# Patient Record
Sex: Female | Born: 1985 | Race: Black or African American | Hispanic: No | Marital: Married | Smoking: Never smoker
Health system: Southern US, Community
[De-identification: ages and names within clinical notes are randomized; demographics above are authoritative.]

## PROBLEM LIST (undated history)

## (undated) ENCOUNTER — Inpatient Hospital Stay (HOSPITAL_COMMUNITY): Payer: Self-pay

## (undated) DIAGNOSIS — D509 Iron deficiency anemia, unspecified: Secondary | ICD-10-CM

## (undated) DIAGNOSIS — M549 Dorsalgia, unspecified: Secondary | ICD-10-CM

## (undated) DIAGNOSIS — R350 Frequency of micturition: Secondary | ICD-10-CM

## (undated) DIAGNOSIS — E119 Type 2 diabetes mellitus without complications: Secondary | ICD-10-CM

## (undated) DIAGNOSIS — Z8669 Personal history of other diseases of the nervous system and sense organs: Secondary | ICD-10-CM

## (undated) DIAGNOSIS — O139 Gestational [pregnancy-induced] hypertension without significant proteinuria, unspecified trimester: Secondary | ICD-10-CM

## (undated) DIAGNOSIS — C801 Malignant (primary) neoplasm, unspecified: Secondary | ICD-10-CM

## (undated) HISTORY — DX: Type 2 diabetes mellitus without complications: E11.9

## (undated) HISTORY — PX: COLONOSCOPY: SHX174

## (undated) HISTORY — DX: Iron deficiency anemia, unspecified: D50.9

## (undated) HISTORY — DX: Malignant (primary) neoplasm, unspecified: C80.1

---

## 2015-01-14 LAB — OB RESULTS CONSOLE ABO/RH: RH TYPE: POSITIVE

## 2015-01-14 LAB — OB RESULTS CONSOLE HGB/HCT, BLOOD
HEMATOCRIT: 35 %
HEMOGLOBIN: 11.2 g/dL

## 2015-01-14 LAB — OB RESULTS CONSOLE HIV ANTIBODY (ROUTINE TESTING): HIV: NONREACTIVE

## 2015-01-14 LAB — OB RESULTS CONSOLE PLATELET COUNT: PLATELETS: 489 10*3/uL

## 2015-01-14 LAB — OB RESULTS CONSOLE ANTIBODY SCREEN: Antibody Screen: NEGATIVE

## 2015-01-14 LAB — OB RESULTS CONSOLE RUBELLA ANTIBODY, IGM: Rubella: IMMUNE

## 2015-01-14 LAB — OB RESULTS CONSOLE RPR: RPR: NONREACTIVE

## 2015-01-14 LAB — OB RESULTS CONSOLE HEPATITIS B SURFACE ANTIGEN: HEP B S AG: NEGATIVE

## 2015-01-14 LAB — OB RESULTS CONSOLE VARICELLA ZOSTER ANTIBODY, IGG: Varicella: IMMUNE

## 2015-02-03 LAB — OB RESULTS CONSOLE GC/CHLAMYDIA
CHLAMYDIA, DNA PROBE: NEGATIVE
GC PROBE AMP, GENITAL: NEGATIVE

## 2015-03-09 ENCOUNTER — Inpatient Hospital Stay (HOSPITAL_COMMUNITY)
Admission: AD | Admit: 2015-03-09 | Discharge: 2015-03-09 | Disposition: A | Discharge status: Home / Self Care | Payer: Medicaid Other | Source: Ambulatory Visit | Attending: Obstetrics and Gynecology | Admitting: Obstetrics and Gynecology

## 2015-03-09 ENCOUNTER — Encounter (HOSPITAL_COMMUNITY): Payer: Self-pay | Admitting: *Deleted

## 2015-03-09 DIAGNOSIS — R110 Nausea: Secondary | ICD-10-CM

## 2015-03-09 DIAGNOSIS — O9989 Other specified diseases and conditions complicating pregnancy, childbirth and the puerperium: Secondary | ICD-10-CM | POA: Diagnosis not present

## 2015-03-09 DIAGNOSIS — O26899 Other specified pregnancy related conditions, unspecified trimester: Secondary | ICD-10-CM

## 2015-03-09 DIAGNOSIS — R109 Unspecified abdominal pain: Secondary | ICD-10-CM

## 2015-03-09 DIAGNOSIS — O211 Hyperemesis gravidarum with metabolic disturbance: Secondary | ICD-10-CM | POA: Insufficient documentation

## 2015-03-09 DIAGNOSIS — Z3A16 16 weeks gestation of pregnancy: Secondary | ICD-10-CM | POA: Insufficient documentation

## 2015-03-09 DIAGNOSIS — E860 Dehydration: Secondary | ICD-10-CM

## 2015-03-09 LAB — URINALYSIS, ROUTINE W REFLEX MICROSCOPIC
Bilirubin Urine: NEGATIVE
Glucose, UA: NEGATIVE mg/dL
Hgb urine dipstick: NEGATIVE
Ketones, ur: 40 mg/dL — AB
LEUKOCYTES UA: NEGATIVE
Nitrite: NEGATIVE
PH: 6 (ref 5.0–8.0)
Protein, ur: NEGATIVE mg/dL
Specific Gravity, Urine: 1.025 (ref 1.005–1.030)
UROBILINOGEN UA: 1 mg/dL (ref 0.0–1.0)

## 2015-03-09 MED ORDER — ONDANSETRON 4 MG PO TBDP: 4.0000 mg | ORAL_TABLET | Freq: Three times a day (TID) | ORAL | Status: DC | PRN

## 2015-03-09 MED ORDER — ONDANSETRON 8 MG PO TBDP
8.0000 mg | ORAL_TABLET | Freq: Once | ORAL | Status: AC
  Administered 2015-03-09: 8 mg via ORAL
  Filled 2015-03-09: qty 1

## 2015-03-09 MED ORDER — ONDANSETRON HCL 4 MG/2ML IJ SOLN: 4.0000 mg | Freq: Once | INTRAMUSCULAR | Status: DC

## 2015-03-09 MED ORDER — PROMETHAZINE HCL 12.5 MG PO TABS: 12.5000 mg | ORAL_TABLET | Freq: Four times a day (QID) | ORAL | Status: DC | PRN

## 2015-03-09 NOTE — Discharge Instructions (Signed)

## 2015-03-09 NOTE — MAU Note (Addendum)
For 3 or 4 days has been really really cramping. Nauseated, denies constipation or diarrhea.  No pain with urination. Bottom of back hurts too

## 2015-03-09 NOTE — MAU Provider Note (Signed)
History     CSN: [id number]  Arrival date and time: 03/09/15 1225   First Provider Initiated Contact with Patient 03/09/15 1400      Chief Complaint  Patient presents with  . Abdominal Cramping   HPI    [name] is a 29 y.o. female [phone] presenting to MAU with abdominal pain. The pain started Saturday; and has worsened throughtout the week. Her father is in critical condition at the hospital and she thought her pain was related to stress.  She has limited appetite and nausea most days which limits her from eating and drinking. She recently moved here to be with her ill father and plans to start prenatal care in [place].   She currently rates her pain 7/10  OB History    Gravida Para Term Preterm AB TAB SAB Ectopic Multiple Living   4 2 2  1  1   2       Past Medical History  Diagnosis Date  . Urinary tract infection   . Anxiety   . Depression     No meds. since + UPT    Past Surgical History  Procedure Laterality Date  . Cesarean section      Family History  Problem Relation Age of Onset  . Hypertension Mother   . Hypertension Father   . Heart disease Paternal Grandmother     History  Substance Use Topics  . Smoking status: Never Smoker   . Smokeless tobacco: Never Used  . Alcohol Use: Yes     Comment: Occas. when not pregnant    Allergies: No Known Allergies  Prescriptions prior to admission  Medication Sig Dispense Refill Last Dose  . acetaminophen (TYLENOL) 500 MG tablet Take 500 mg by mouth every 6 (six) hours as needed for mild pain or headache.   Past Week at Unknown time  . Prenatal Vit-Fe Fumarate-FA (PRENATAL MULTIVITAMIN) TABS tablet Take 1 tablet by mouth daily at 12 noon.   03/09/2015 at Unknown time   Results for orders placed or performed during the hospital encounter of 03/09/15 (from the past 24 hour(s))  Urinalysis, Routine w reflex microscopic (not at [facility])     Status: Abnormal   Collection Time: 03/09/15 12:38 PM   Result Value Ref Range   Color, Urine YELLOW YELLOW   APPearance CLOUDY (A) CLEAR   Specific Gravity, Urine 1.025 1.005 - 1.030   pH 6.0 5.0 - 8.0   Glucose, UA NEGATIVE NEGATIVE mg/dL   Hgb urine dipstick NEGATIVE NEGATIVE   Bilirubin Urine NEGATIVE NEGATIVE   Ketones, ur 40 (A) NEGATIVE mg/dL   Protein, ur NEGATIVE NEGATIVE mg/dL   Urobilinogen, UA 1.0 0.0 - 1.0 mg/dL   Nitrite NEGATIVE NEGATIVE   Leukocytes, UA NEGATIVE NEGATIVE    Review of Systems  Constitutional: Negative for fever.  Gastrointestinal: Positive for abdominal pain (Bilateral lower abdomen. ).  Genitourinary: Negative for dysuria, urgency and frequency.   Physical Exam   Blood pressure 111/58, pulse 79, temperature 98 F (36.7 C), temperature source Oral, resp. rate 18, height 5\' 8"  (1.727 m), weight 88.451 kg (195 lb).  Physical Exam  Constitutional: She is oriented to person, place, and time. She appears well-developed and well-nourished. No distress.  HENT:  Head: Normocephalic.  Eyes: Pupils are equal, round, and reactive to light.  Neck: Neck supple.  Respiratory: Effort normal.  GI: Soft. There is no tenderness.  Musculoskeletal: Normal range of motion.  Neurological: She is alert and oriented to person, place,  and time.  Skin: Skin is warm. She is not diaphoretic.  Psychiatric: Her behavior is normal.    MAU Course  Procedures  None  MDM  + fetal heart tones   PO fluid encouraged; patient drank a large pitcher of water here Zofran 4 mg given PO  Patient ordered a meal and tolerated it.   She states her abdominal discomfort is better.    Assessment and Plan   A:  1. Abdominal cramping affecting pregnancy   2. Nausea   3. Mild dehydration     P:  Discharge home in stable condition Message sent to the [facility] for referral Second trimester warning signs discussed Increase water intake RX: Phenergan        Zofran Return to MAU if symptoms worsen Small, frequent  meals    Amy Lye, NP 03/09/2015 2:01 PM

## 2015-03-11 ENCOUNTER — Encounter: Payer: Self-pay | Admitting: Advanced Practice Midwife

## 2015-03-22 ENCOUNTER — Ambulatory Visit (INDEPENDENT_AMBULATORY_CARE_PROVIDER_SITE_OTHER): Payer: Self-pay | Admitting: Advanced Practice Midwife

## 2015-03-22 ENCOUNTER — Encounter: Payer: Self-pay | Admitting: Advanced Practice Midwife

## 2015-03-22 VITALS — BP 114/84 | HR 95 | Temp 98.6°F | Ht 61.0 in | Wt 195.2 lb

## 2015-03-22 DIAGNOSIS — Z8759 Personal history of other complications of pregnancy, childbirth and the puerperium: Secondary | ICD-10-CM

## 2015-03-22 DIAGNOSIS — O3421 Maternal care for scar from previous cesarean delivery: Secondary | ICD-10-CM

## 2015-03-22 DIAGNOSIS — Z3482 Encounter for supervision of other normal pregnancy, second trimester: Secondary | ICD-10-CM

## 2015-03-22 DIAGNOSIS — O34219 Maternal care for unspecified type scar from previous cesarean delivery: Secondary | ICD-10-CM

## 2015-03-22 DIAGNOSIS — Z3492 Encounter for supervision of normal pregnancy, unspecified, second trimester: Secondary | ICD-10-CM | POA: Insufficient documentation

## 2015-03-22 LAB — POCT URINALYSIS DIP (DEVICE)
BILIRUBIN URINE: NEGATIVE
GLUCOSE, UA: NEGATIVE mg/dL
Hgb urine dipstick: NEGATIVE
Ketones, ur: NEGATIVE mg/dL
LEUKOCYTES UA: NEGATIVE
NITRITE: NEGATIVE
Protein, ur: NEGATIVE mg/dL
Specific Gravity, Urine: 1.025 (ref 1.005–1.030)
Urobilinogen, UA: 0.2 mg/dL (ref 0.0–1.0)
pH: 6.5 (ref 5.0–8.0)

## 2015-03-22 LAB — POCT PREGNANCY, URINE: Preg Test, Ur: POSITIVE — AB

## 2015-03-22 NOTE — Progress Notes (Signed)
Here for first visit, Transferring care from [place], [place]. Given new patient education booklet.

## 2015-03-22 NOTE — Patient Instructions (Addendum)
Second Trimester of Pregnancy The second trimester is from week 13 through week 28, months 4 through 6. The second trimester is often a time when you feel your best. Your body has also adjusted to being pregnant, and you begin to feel better physically. Usually, morning sickness has lessened or quit completely, you may have more energy, and you may have an increase in appetite. The second trimester is also a time when the fetus is growing rapidly. At the end of the sixth month, the fetus is about 9 inches long and weighs about 1 pounds. You will likely begin to feel the baby move (quickening) between 18 and 20 weeks of the pregnancy. BODY CHANGES Your body goes through many changes during pregnancy. The changes vary from woman to woman.   Your weight will continue to increase. You will notice your lower abdomen bulging out.  You may begin to get stretch marks on your hips, abdomen, and breasts.  You may develop headaches that can be relieved by medicines approved by your health care provider.  You may urinate more often because the fetus is pressing on your bladder.  You may develop or continue to have heartburn as a result of your pregnancy.  You may develop constipation because certain hormones are causing the muscles that push waste through your intestines to slow down.  You may develop hemorrhoids or swollen, bulging veins (varicose veins).  You may have back pain because of the weight gain and pregnancy hormones relaxing your joints between the bones in your pelvis and as a result of a shift in weight and the muscles that support your balance.  Your breasts will continue to grow and be tender.  Your gums may bleed and may be sensitive to brushing and flossing.  Dark spots or blotches (chloasma, mask of pregnancy) may develop on your face. This will likely fade after the baby is born.  A dark line from your belly button to the pubic area (linea nigra) may appear. This will likely fade  after the baby is born.  You may have changes in your hair. These can include thickening of your hair, rapid growth, and changes in texture. Some women also have hair loss during or after pregnancy, or hair that feels dry or thin. Your hair will most likely return to normal after your baby is born. WHAT TO EXPECT AT YOUR PRENATAL VISITS During a routine prenatal visit:  You will be weighed to make sure you and the fetus are growing normally.  Your blood pressure will be taken.  Your abdomen will be measured to track your baby's growth.  The fetal heartbeat will be listened to.  Any test results from the previous visit will be discussed. Your health care provider may ask you:  How you are feeling.  If you are feeling the baby move.  If you have had any abnormal symptoms, such as leaking fluid, bleeding, severe headaches, or abdominal cramping.  If you have any questions. Other tests that may be performed during your second trimester include:  Blood tests that check for:  Low iron levels (anemia).  Gestational diabetes (between 24 and 28 weeks).  Rh antibodies.  Urine tests to check for infections, diabetes, or protein in the urine.  An ultrasound to confirm the proper growth and development of the baby.  An amniocentesis to check for possible genetic problems.  Fetal screens for spina bifida and Down syndrome. HOME CARE INSTRUCTIONS   Avoid all smoking, herbs, alcohol, and unprescribed   drugs. These chemicals affect the formation and growth of the baby.  Follow your health care provider's instructions regarding medicine use. There are medicines that are either safe or unsafe to take during pregnancy.  Exercise only as directed by your health care provider. Experiencing uterine cramps is a good sign to stop exercising.  Continue to eat regular, healthy meals.  Wear a good support bra for breast tenderness.  Do not use hot tubs, steam rooms, or saunas.  Wear your  seat belt at all times when driving.  Avoid raw meat, uncooked cheese, cat litter boxes, and soil used by cats. These carry germs that can cause birth defects in the baby.  Take your prenatal vitamins.  Try taking a stool softener (if your health care provider approves) if you develop constipation. Eat more high-fiber foods, such as fresh vegetables or fruit and whole grains. Drink plenty of fluids to keep your urine clear or pale yellow.  Take warm sitz baths to soothe any pain or discomfort caused by hemorrhoids. Use hemorrhoid cream if your health care provider approves.  If you develop varicose veins, wear support hose. Elevate your feet for 15 minutes, 3-4 times a day. Limit salt in your diet.  Avoid heavy lifting, wear low heel shoes, and practice good posture.  Rest with your legs elevated if you have leg cramps or low back pain.  Visit your dentist if you have not gone yet during your pregnancy. Use a soft toothbrush to brush your teeth and be gentle when you floss.  A sexual relationship may be continued unless your health care provider directs you otherwise.  Continue to go to all your prenatal visits as directed by your health care provider. SEEK MEDICAL CARE IF:   You have dizziness.  You have mild pelvic cramps, pelvic pressure, or nagging pain in the abdominal area.  You have persistent nausea, vomiting, or diarrhea.  You have a bad smelling vaginal discharge.  You have pain with urination. SEEK IMMEDIATE MEDICAL CARE IF:   You have a fever.  You are leaking fluid from your vagina.  You have spotting or bleeding from your vagina.  You have severe abdominal cramping or pain.  You have rapid weight gain or loss.  You have shortness of breath with chest pain.  You notice sudden or extreme swelling of your face, hands, ankles, feet, or legs.  You have not felt your baby move in over an hour.  You have severe headaches that do not go away with  medicine.  You have vision changes. Document Released: 09/04/2001 Document Revised: 09/15/2013 Document Reviewed: 11/11/2012 [facility] Patient Information 2015 [place], [place]. This information is not intended to replace advice given to you by your health care provider. Make sure you discuss any questions you have with your health care provider.  Breastfeeding Challenges and Solutions Even though breastfeeding is natural, it can be challenging, especially in the first few weeks after childbirth. It is normal for problems to arise when starting to breastfeed your new baby, even if you have breastfed before. This document provides some solutions to the most common breastfeeding challenges.  CHALLENGES AND SOLUTIONS Challenge--Cracked or Sore Nipples Cracked or sore nipples are commonly experienced by breastfeeding mothers. Cracked or sore nipples often are caused by inadequate latching (when your baby's mouth attaches to your breast to breastfeed). Soreness can also happen if your baby is not positioned properly at your breast. Although nipple cracking and soreness are common during the first week after birth,  nipple pain is never normal. If you experience nipple cracking or soreness that lasts longer than 1 week or nipple pain, call your health care provider or lactation consultant.  Solution Ensure proper latching and positioning of your baby by following the steps below:  Find a comfortable place to sit or lie down, with your neck and back well supported.  Place a pillow or rolled up blanket under your baby to bring him or her to the level of your breast (if you are seated).  Make sure that your baby's abdomen is facing your abdomen.  Gently massage your breast. With your fingertips, massage from your chest wall toward your nipple in a circular motion. This encourages milk flow. You may need to continue this action during the feeding if your milk flows slowly.  Support your breast with 4  fingers underneath and your thumb above your nipple. Make sure your fingers are well away from your nipple and your baby's mouth.  Stroke your baby's lips gently with your finger or nipple.  When your baby's mouth is open wide enough, quickly bring your baby to your breast, placing your entire nipple and as much of the colored area around your nipple (areola) as possible into your baby's mouth.  More areola should be visible above your baby's upper lip than below the lower lip.  Your baby's tongue should be between his or her lower gum and your breast.  Ensure that your baby's mouth is correctly positioned around your nipple (latched). Your baby's lips should create a seal on your breast and be turned out (everted).  It is common for your baby to suck for about 2-3 minutes in order to start the flow of breast milk. Signs that your baby has successfully latched on to your nipple include:   Quietly tugging or quietly sucking without causing you pain.   Swallowing heard between every 3-4 sucks.   Muscle movement above and in front of his or her ears with sucking.  Signs that your baby has not successfully latched on to nipple include:   Sucking sounds or smacking sounds from your baby while nursing.   Nipple pain.  Ensure that your breasts stay moisturized and healthy by:  Avoiding the use of soap on your nipples.   Wearing a supportive bra. Avoid wearing underwire-style bras or tight bras.   Air drying your nipples for 3-4 minutes after each feeding.   Using only cotton bra pads to absorb breast milk leakage. Leaking of breast milk between feedings is normal. Be sure to change the pads if they become soaked with milk.  Using lanolin on your nipples after nursing. Lanolin helps to maintain your skin's normal moisture barrier. If you use pure lanolin you do not need to wash it off before feeding your baby again. Pure lanolin is not toxic to your baby. You may also hand  express a few drops of breast milk and gently massage that milk into your nipples, allowing it to air dry. Challenge--Breast Engorgement Breast engorgement is the overfilling of your breasts with breast milk. In the first few weeks after giving birth, you may experience breast engorgement. Breast engorgement can make your breasts throb and feel hard, tightly stretched, warm, and tender. Engorgement peaks about the fifth day after you give birth. Having breast engorgement does not mean you have to stop breastfeeding your baby. Solution  Breastfeed when you feel the need to reduce the fullness of your breasts or when your baby shows signs of  hunger. This is called "breastfeeding on demand."  Newborns (babies younger than 4 weeks) often breastfeed every 1-3 hours during the day. You may need to awaken your baby to feed if he or she is asleep at a feeding time.  Do not allow your baby to sleep longer than 5 hours during the night without a feeding.  Pump or hand express breast milk before breastfeeding to soften your breast, areola, and nipple.  Apply warm, moist heat (in the shower or with warm water-soaked hand towels) just before feeding or pumping, or massage your breast before or during breastfeeding. This increases circulation and helps your milk to flow.  Completely empty your breasts when breastfeeding or pumping. Afterward, wear a snug bra (nursing or regular) or tank top for 1-2 days to signal your body to slightly decrease milk production. Only wear snug bras or tank tops to treat engorgement. Tight bras typically should be avoided by breastfeeding mothers. Once engorgement is relieved, return to wearing regular, loose-fitting clothes.  Apply ice packs to your breasts to lessen the pain from engorgement and relieve swelling, unless the ice is uncomfortable for you.  Do not delay feedings. Try to relax when it is time to feed your baby. This helps to trigger your "let-down reflex," which  releases milk from your breast.  Ensure your baby is latched on to your breast and positioned properly while breastfeeding.  Allow your baby to remain at your breast as long as he or she is latched on well and actively sucking. Your baby will let you know when he or she is done breastfeeding by pulling away from your breast or falling asleep.  Avoid introducing bottles or pacifiers to your baby in the early weeks of breastfeeding. Wait to introduce these things until after resolving any breastfeeding challenges.  Try to pump your milk on the same schedule as when your baby would breastfeed if you are returning to work or away from home for an extended period.  Drink plenty of fluids to avoid dehydration, which can eventually put you at greater risk of breast engorgement. If you follow these suggestions, your engorgement should improve in 24-48 hours. If you are still experiencing difficulty, call your lactation consultant or health care provider.  Challenge--Plugged Milk Ducts Plugged milk ducts occur when the duct does not drain milk effectively and becomes swollen. Wearing a tight-fitting nursing bra or having difficulty with latching may cause plugged milk ducts. Not drinking enough water (8-10 c [1.9-2.4 L] per day) can contribute to plugged milk ducts. Once a duct has become plugged, hard lumps, soreness, and redness may develop in your breast.  Solution Do not delay feedings. Feed your baby frequently and try to empty your breasts of milk at each feeding. Try breastfeeding from the affected side first so there is a better chance that the milk will drain completely from that breast. Apply warm, moist towels to your breasts for 5-10 minutes before feeding. Alternatively, a hot shower right before breastfeeding can provide the moist heat that can encourage milk flow. Gentle massage of the sore area before and during a feeding may also help. Avoid wearing tight clothing or bras that put pressure on  your breasts. Wear bras that offer good support to your breasts, but avoid underwire bras. If you have a plugged milk duct and develop a fever, you need to see your health care provider.  Challenge--Mastitis Mastitis is inflammation of your breast. It usually is caused by a bacterial infection and can  cause flu-like symptoms. You may develop redness in your breast and a fever. Often when mastitis occurs, your breast becomes firm, warm, and very painful. The most common causes of mastitis are poor latching, ineffective sucking from your baby, consistent pressure on your breast (possibly from wearing a tight-fitting bra or shirt that restricts the milk flow), unusual stress or fatigue, or missed feedings.  Solution You will be given antibiotic medicine to treat the infection. It is still important to breastfeed frequently to empty your breasts. Continuing to breastfeed while you recover from mastitis will not harm your baby. Make sure your baby is positioned properly during every feeding. Apply moist heat to your breasts for a few minutes before feeding to help the milk flow and to help your breasts empty more easily. Challenge--Thrush Ritta Slot is a yeast infection that can form on your nipples, in your breast, or in your baby's mouth. It causes itching, soreness, burning or stabbing pain, and sometimes a rash.  Solution You will be given a medicated ointment for your nipples, and your baby will be given a liquid medicine for his or her mouth. It is important that you and your baby are treated at the same time because thrush can be passed between you and your baby. Change disposable nursing pads often. Any bras, towels, or clothing that come in contact with infected areas of your body or your baby's body need to be washed in very hot water every day. Wash your hands and your baby's hands often. All pacifiers, bottle nipples, or toys your baby puts in his or her mouth should be boiled once a day for 20 minutes.  After 1 week of treatment, discard pacifiers and bottle nipples and buy new ones. All breast pump parts that touch the milk need to be boiled for 20 minutes every day. Challenge--Low Milk Supply You may not be producing enough milk if your baby is not gaining the proper amount of weight. Breast milk production is based on a supply-and-demand system. Your milk supply depends on how frequently and effectively your baby empties your breast. Solution The more you breastfeed and pump, the more breast milk you will produce. It is important that your baby empties at least one of your breasts at each feeding. If this is not happening, then use a breast pump or hand express any milk that remains. This will help to drain as much milk as possible at each feeding. It will also signal your body to produce more milk. If your baby is not emptying your breasts, it may be due to latching, sucking, or positioning problems. If low milk supply continues after addressing these issues, contact your health care provider or a lactation specialist as soon as possible. Challenge--Inverted or Flat Nipples Some women have nipples that turn inward instead of protruding outward. Other women have nipples that are flat. Inverted or flat nipples can sometimes make it more difficult for your baby to latch onto your breast. Solution You may be given a small device that pulls out inverted nipples. This device should be applied right before your baby is brought to your breast. You can also try using a breast pump for a short time before placing the baby at your breast. The pump can pull your nipple outwards to help your infant latch more easily. The baby's sucking motion will help the inverted nipple protrude as well.  If you have flat nipples, encourage your baby to latch onto your breast and feed frequently in the early  days after birth. This will give your baby practice latching on correctly while your breast is still soft. When your milk  supply increases, between the second and fifth day after birth and your breasts become full, your baby will have an easier time latching.  Contact a lactation consultant if you still have concerns. She or he can teach you additional techniques to address breastfeeding problems related to nipple shape and position.  FOR MORE INFORMATION [employer]: www.llli.org Document Released: 03/04/2006 Document Revised: 09/15/2013 Document Reviewed: 03/06/2013 [facility] Patient Information 2015 [place], [place]. This information is not intended to replace advice given to you by your health care provider. Make sure you discuss any questions you have with your health care provider. Second Trimester of Pregnancy The second trimester is from week 13 through week 28, months 4 through 6. The second trimester is often a time when you feel your best. Your body has also adjusted to being pregnant, and you begin to feel better physically. Usually, morning sickness has lessened or quit completely, you may have more energy, and you may have an increase in appetite. The second trimester is also a time when the fetus is growing rapidly. At the end of the sixth month, the fetus is about 9 inches long and weighs about 1 pounds. You will likely begin to feel the baby move (quickening) between 18 and 20 weeks of the pregnancy. BODY CHANGES Your body goes through many changes during pregnancy. The changes vary from woman to woman.   Your weight will continue to increase. You will notice your lower abdomen bulging out.  You may begin to get stretch marks on your hips, abdomen, and breasts.  You may develop headaches that can be relieved by medicines approved by your health care provider.  You may urinate more often because the fetus is pressing on your bladder.  You may develop or continue to have heartburn as a result of your pregnancy.  You may develop constipation because certain hormones are causing the  muscles that push waste through your intestines to slow down.  You may develop hemorrhoids or swollen, bulging veins (varicose veins).  You may have back pain because of the weight gain and pregnancy hormones relaxing your joints between the bones in your pelvis and as a result of a shift in weight and the muscles that support your balance.  Your breasts will continue to grow and be tender.  Your gums may bleed and may be sensitive to brushing and flossing.  Dark spots or blotches (chloasma, mask of pregnancy) may develop on your face. This will likely fade after the baby is born.  A dark line from your belly button to the pubic area (linea nigra) may appear. This will likely fade after the baby is born.  You may have changes in your hair. These can include thickening of your hair, rapid growth, and changes in texture. Some women also have hair loss during or after pregnancy, or hair that feels dry or thin. Your hair will most likely return to normal after your baby is born. WHAT TO EXPECT AT YOUR PRENATAL VISITS During a routine prenatal visit:  You will be weighed to make sure you and the fetus are growing normally.  Your blood pressure will be taken.  Your abdomen will be measured to track your baby's growth.  The fetal heartbeat will be listened to.  Any test results from the previous visit will be discussed. Your health care provider may ask you:  How you are feeling.  If you are feeling the baby move.  If you have had any abnormal symptoms, such as leaking fluid, bleeding, severe headaches, or abdominal cramping.  If you have any questions. Other tests that may be performed during your second trimester include:  Blood tests that check for:  Low iron levels (anemia).  Gestational diabetes (between 24 and 28 weeks).  Rh antibodies.  Urine tests to check for infections, diabetes, or protein in the urine.  An ultrasound to confirm the proper growth and development of  the baby.  An amniocentesis to check for possible genetic problems.  Fetal screens for spina bifida and Down syndrome. HOME CARE INSTRUCTIONS   Avoid all smoking, herbs, alcohol, and unprescribed drugs. These chemicals affect the formation and growth of the baby.  Follow your health care provider's instructions regarding medicine use. There are medicines that are either safe or unsafe to take during pregnancy.  Exercise only as directed by your health care provider. Experiencing uterine cramps is a good sign to stop exercising.  Continue to eat regular, healthy meals.  Wear a good support bra for breast tenderness.  Do not use hot tubs, steam rooms, or saunas.  Wear your seat belt at all times when driving.  Avoid raw meat, uncooked cheese, cat litter boxes, and soil used by cats. These carry germs that can cause birth defects in the baby.  Take your prenatal vitamins.  Try taking a stool softener (if your health care provider approves) if you develop constipation. Eat more high-fiber foods, such as fresh vegetables or fruit and whole grains. Drink plenty of fluids to keep your urine clear or pale yellow.  Take warm sitz baths to soothe any pain or discomfort caused by hemorrhoids. Use hemorrhoid cream if your health care provider approves.  If you develop varicose veins, wear support hose. Elevate your feet for 15 minutes, 3-4 times a day. Limit salt in your diet.  Avoid heavy lifting, wear low heel shoes, and practice good posture.  Rest with your legs elevated if you have leg cramps or low back pain.  Visit your dentist if you have not gone yet during your pregnancy. Use a soft toothbrush to brush your teeth and be gentle when you floss.  A sexual relationship may be continued unless your health care provider directs you otherwise.  Continue to go to all your prenatal visits as directed by your health care provider. SEEK MEDICAL CARE IF:   You have dizziness.  You have  mild pelvic cramps, pelvic pressure, or nagging pain in the abdominal area.  You have persistent nausea, vomiting, or diarrhea.  You have a bad smelling vaginal discharge.  You have pain with urination. SEEK IMMEDIATE MEDICAL CARE IF:   You have a fever.  You are leaking fluid from your vagina.  You have spotting or bleeding from your vagina.  You have severe abdominal cramping or pain.  You have rapid weight gain or loss.  You have shortness of breath with chest pain.  You notice sudden or extreme swelling of your face, hands, ankles, feet, or legs.  You have not felt your baby move in over an hour.  You have severe headaches that do not go away with medicine.  You have vision changes. Document Released: 09/04/2001 Document Revised: 09/15/2013 Document Reviewed: 11/11/2012 [facility] Patient Information 2015 [place], [place]. This information is not intended to replace advice given to you by your health care provider. Make sure you discuss any questions  you have with your health care provider.

## 2015-03-22 NOTE — Progress Notes (Signed)
   Subjective:    Amy Kim is a [id number] [redacted]w[redacted]d being seen today for her first obstetrical visit.  Transfer of care from [place]. Records not available for review, but states she has NOB labs, Pap and attempted first trimester screen, but unable to obtain views. Has not had anatomy scan. Her obstetrical history is significant for C/S x 2, obesity HX GHTN. Patient does intend to breast feed. Pregnancy history fully reviewed.  Patient reports no complaints.  Filed Vitals:   03/22/15 0815 03/22/15 0818  BP: 114/84   Pulse: 95   Temp: 98.6 F (37 C)   Height:  5\' 1"  (1.549 m)  Weight: 195 lb 3.2 oz (88.542 kg)     HISTORY: OB History  Gravida Para Term Preterm AB SAB TAB Ectopic Multiple Living  4 2 2  1 1    4     # Outcome Date GA Lbr Len/2nd Weight Sex Delivery Anes PTL Lv  4 Current           3 Term 05/07/13 [redacted]w[redacted]d  6 lb 14 oz (3.118 kg) M CS-Unspec   Y     Comments: no complications, born [place]  2 Term 08/2010 [redacted]w[redacted]d  6 lb 11 oz (3.033 kg) M CS-Unspec   Y     Comments: emergency c/s due to maternal blood pressure and fhr, born [place]  1 SAB 09/2009 [redacted]w[redacted]d            Past Medical History  Diagnosis Date  . Urinary tract infection   . Anxiety   . Depression     No meds. since + UPT   Past Surgical History  Procedure Laterality Date  . Cesarean section     Family History  Problem Relation Age of Onset  . Hypertension Mother   . Hypertension Father   . Heart disease Paternal Grandmother   .        Exam    Uterus:   Fundus @U   Pelvic Exam: Deferred    Bony Pelvis: Unproven  System: Breast:  normal appearance, no masses or tenderness   Skin: normal coloration and turgor, no rashes    Neurologic: oriented, normal mood, grossly non-focal   Extremities: normal strength, tone, and muscle mass, Tr pedal edema   HEENT sclera clear, anicteric and neck supple with midline trachea   Mouth/Teeth mucous membranes moist, pharynx normal without lesions   Cardiovascular: regular rate and rhythm, no murmurs or gallops   Respiratory:  appears well, vitals normal, no respiratory distress, acyanotic, normal RR, chest clear, no wheezing, crepitations, rhonchi, normal symmetric air entry   Abdomen: soft, non-tender; bowel sounds normal; no masses,  no organomegaly and gravid      Assessment:    Pregnancy: [id number] 1. Supervision of normal pregnancy in second trimester - Glucose Tolerance, 1 HR (50g) - Prescript Monitor Profile(19) - US OB Comp + 14 Wk; Future  2. History of gestational hypertension   3. History of cesarean delivery, currently pregnant    Plan:     Records from [place] requested. Prenatal vitamins. Problem list reviewed and updated. Genetic Screening discussed First Screen: Unable to obtain in [place]. Declines quad. Ultrasound discussed; fetal survey: ordered. Follow up in 4 weeks. Early 1 hour GTT  Will need repeat C/S  Manya Silvas 03/22/2015

## 2015-03-23 ENCOUNTER — Telehealth: Payer: Self-pay

## 2015-03-23 LAB — GLUCOSE TOLERANCE, 1 HOUR (50G) W/O FASTING: Glucose, 1 Hour GTT: 173 mg/dL — ABNORMAL HIGH (ref 70–140)

## 2015-03-23 NOTE — Telephone Encounter (Signed)
Called patient and informed her of need for 3hr gtt-- patient states she can come in Wednesday 03/30/15 at 0800. Advised patient she must be fasting and to expect to be here for 3 hours. Verbalized understanding and gratitude. No questions or concerns. Message sent to admin pool to add to schedule.

## 2015-03-23 NOTE — Telephone Encounter (Signed)
-----   Message from [place], [place] sent at 03/23/2015  8:08 AM EDT ----- Failed 1 hour GTT. Needs 3 hour GTT.

## 2015-03-24 DIAGNOSIS — Z98891 History of uterine scar from previous surgery: Secondary | ICD-10-CM | POA: Insufficient documentation

## 2015-03-24 DIAGNOSIS — Z8759 Personal history of other complications of pregnancy, childbirth and the puerperium: Secondary | ICD-10-CM | POA: Insufficient documentation

## 2015-03-25 LAB — PRESCRIPTION MONITORING PROFILE (19 PANEL)
Amphetamine/Meth: NEGATIVE ng/mL
BARBITURATE SCREEN, URINE: NEGATIVE ng/mL
BENZODIAZEPINE SCREEN, URINE: NEGATIVE ng/mL
Buprenorphine, Urine: NEGATIVE ng/mL
CREATININE, URINE: 175.52 mg/dL (ref >20.0)
Cannabinoid Scrn, Ur: NEGATIVE ng/mL
Carisoprodol, Urine: NEGATIVE ng/mL
Cocaine Metabolites: NEGATIVE ng/mL
FENTANYL URINE: NEGATIVE ng/mL
MDMA URINE: NEGATIVE ng/mL
METHAQUALONE SCREEN (URINE): NEGATIVE ng/mL
Meperidine, Ur: NEGATIVE ng/mL
Methadone Screen, Urine: NEGATIVE ng/mL
Nitrites, Initial: NEGATIVE ug/mL
Opiate Screen, Urine: NEGATIVE ng/mL
Oxycodone Screen, Ur: NEGATIVE ng/mL
PH URINE, INITIAL: 6.8 pH (ref 4.5–8.9)
PHENCYCLIDINE, UR: NEGATIVE ng/mL
Propoxyphene: NEGATIVE ng/mL
TRAMADOL UR: NEGATIVE ng/mL
Tapentadol, urine: NEGATIVE ng/mL
Zolpidem, Urine: NEGATIVE ng/mL

## 2015-03-29 ENCOUNTER — Ambulatory Visit (HOSPITAL_COMMUNITY)
Admission: RE | Admit: 2015-03-29 | Discharge: 2015-03-29 | Disposition: A | Discharge status: Home / Self Care | Payer: Medicaid Other | Source: Ambulatory Visit | Attending: Advanced Practice Midwife | Admitting: Advanced Practice Midwife

## 2015-03-29 ENCOUNTER — Encounter: Payer: Self-pay | Admitting: *Deleted

## 2015-03-29 ENCOUNTER — Other Ambulatory Visit: Payer: Self-pay | Admitting: Advanced Practice Midwife

## 2015-03-29 ENCOUNTER — Other Ambulatory Visit: Payer: Self-pay

## 2015-03-29 ENCOUNTER — Other Ambulatory Visit: Payer: Self-pay | Admitting: Family Medicine

## 2015-03-29 DIAGNOSIS — R1900 Intra-abdominal and pelvic swelling, mass and lump, unspecified site: Secondary | ICD-10-CM

## 2015-03-29 DIAGNOSIS — Z36 Encounter for antenatal screening of mother: Secondary | ICD-10-CM | POA: Insufficient documentation

## 2015-03-29 DIAGNOSIS — Z3A19 19 weeks gestation of pregnancy: Secondary | ICD-10-CM | POA: Diagnosis not present

## 2015-03-29 DIAGNOSIS — R160 Hepatomegaly, not elsewhere classified: Secondary | ICD-10-CM

## 2015-03-29 DIAGNOSIS — Z3492 Encounter for supervision of normal pregnancy, unspecified, second trimester: Secondary | ICD-10-CM

## 2015-03-29 DIAGNOSIS — O3421 Maternal care for scar from previous cesarean delivery: Secondary | ICD-10-CM | POA: Insufficient documentation

## 2015-03-29 DIAGNOSIS — O99210 Obesity complicating pregnancy, unspecified trimester: Secondary | ICD-10-CM | POA: Insufficient documentation

## 2015-03-29 DIAGNOSIS — O34219 Maternal care for unspecified type scar from previous cesarean delivery: Secondary | ICD-10-CM

## 2015-03-29 DIAGNOSIS — IMO0002 Reserved for concepts with insufficient information to code with codable children: Secondary | ICD-10-CM

## 2015-03-29 DIAGNOSIS — O09292 Supervision of pregnancy with other poor reproductive or obstetric history, second trimester: Secondary | ICD-10-CM | POA: Diagnosis not present

## 2015-03-29 DIAGNOSIS — Z3689 Encounter for other specified antenatal screening: Secondary | ICD-10-CM | POA: Insufficient documentation

## 2015-03-29 LAB — HEPATIC FUNCTION PANEL
ALK PHOS: 71 U/L (ref 39–117)
ALT: 25 U/L (ref 0–35)
AST: 14 U/L (ref 0–37)
Albumin: 3.5 g/dL (ref 3.5–5.2)
BILIRUBIN TOTAL: 0.2 mg/dL (ref 0.2–1.2)
Bilirubin, Direct: 0.1 mg/dL (ref 0.0–0.3)
Indirect Bilirubin: 0.1 mg/dL — ABNORMAL LOW (ref 0.2–1.2)
Total Protein: 6.5 g/dL (ref 6.0–8.3)

## 2015-03-30 ENCOUNTER — Other Ambulatory Visit: Payer: Self-pay

## 2015-03-30 DIAGNOSIS — Z3492 Encounter for supervision of normal pregnancy, unspecified, second trimester: Secondary | ICD-10-CM

## 2015-03-30 DIAGNOSIS — R7309 Other abnormal glucose: Secondary | ICD-10-CM

## 2015-03-31 ENCOUNTER — Telehealth: Payer: Self-pay | Admitting: *Deleted

## 2015-03-31 LAB — GLUCOSE TOLERANCE, 3 HOURS
GLUCOSE 3 HOUR GTT: 98 mg/dL (ref 70–144)
Glucose Tolerance, 1 hour: 172 mg/dL (ref 70–189)
Glucose Tolerance, 2 hour: 133 mg/dL (ref 70–164)
Glucose Tolerance, Fasting: 82 mg/dL (ref 70–104)

## 2015-03-31 NOTE — Telephone Encounter (Signed)
Contacted Patient to give results and recommendation by Dr. Nehemiah Settle.  Pt to contact clinic and schedule ultrasound when medicaid is active.  Pt verbalizes understanding.

## 2015-04-04 ENCOUNTER — Telehealth: Payer: Self-pay | Admitting: *Deleted

## 2015-04-04 NOTE — Telephone Encounter (Addendum)
Received voicemail message from nurse line left on 04/04/15 at 1329.  Patient requests test results for her diabetes test.  Patient requests a return call.  7/13  0905  Called pt and informed her that her tests shows that she does not have Gestational Diabetes. Her lever function test was normal.  Pt voiced understanding and had no questions. Diane Day RNC

## 2015-04-19 ENCOUNTER — Encounter: Payer: Self-pay | Admitting: Obstetrics & Gynecology

## 2015-04-19 ENCOUNTER — Ambulatory Visit (INDEPENDENT_AMBULATORY_CARE_PROVIDER_SITE_OTHER): Payer: Self-pay | Admitting: Obstetrics & Gynecology

## 2015-04-19 VITALS — BP 118/53 | HR 82 | Temp 98.5°F | Wt 197.9 lb

## 2015-04-19 DIAGNOSIS — K668 Other specified disorders of peritoneum: Secondary | ICD-10-CM

## 2015-04-19 DIAGNOSIS — Z3492 Encounter for supervision of normal pregnancy, unspecified, second trimester: Secondary | ICD-10-CM

## 2015-04-19 DIAGNOSIS — O3421 Maternal care for scar from previous cesarean delivery: Secondary | ICD-10-CM

## 2015-04-19 DIAGNOSIS — IMO0002 Reserved for concepts with insufficient information to code with codable children: Secondary | ICD-10-CM

## 2015-04-19 DIAGNOSIS — E669 Obesity, unspecified: Secondary | ICD-10-CM

## 2015-04-19 DIAGNOSIS — O99210 Obesity complicating pregnancy, unspecified trimester: Secondary | ICD-10-CM

## 2015-04-19 DIAGNOSIS — Z8759 Personal history of other complications of pregnancy, childbirth and the puerperium: Secondary | ICD-10-CM | POA: Insufficient documentation

## 2015-04-19 DIAGNOSIS — Z3482 Encounter for supervision of other normal pregnancy, second trimester: Secondary | ICD-10-CM

## 2015-04-19 DIAGNOSIS — O34219 Maternal care for unspecified type scar from previous cesarean delivery: Secondary | ICD-10-CM

## 2015-04-19 LAB — POCT URINALYSIS DIP (DEVICE)
Bilirubin Urine: NEGATIVE
Glucose, UA: NEGATIVE mg/dL
Hgb urine dipstick: NEGATIVE
KETONES UR: 15 mg/dL — AB
Leukocytes, UA: NEGATIVE
NITRITE: NEGATIVE
Protein, ur: NEGATIVE mg/dL
Specific Gravity, Urine: 1.02 (ref 1.005–1.030)
Urobilinogen, UA: 1 mg/dL (ref 0.0–1.0)
pH: 7 (ref 5.0–8.0)

## 2015-04-19 NOTE — Progress Notes (Signed)
Subjective:  Amy Kim is a 29 y.o. [id number] at [redacted]w[redacted]d being seen today for ongoing prenatal care.  Patient reports no complaints.  Contractions: Not present.  Vag. Bleeding: None. Movement: Present. Denies leaking of fluid.   The following portions of the patient's history were reviewed and updated as appropriate: allergies, current medications, past family history, past medical history, past social history, past surgical history and problem list.   Objective:   Filed Vitals:   04/19/15 1304  BP: 118/53  Pulse: 82  Temp: 98.5 F (36.9 C)  Weight: 197 lb 14.4 oz (89.767 kg)    Fetal Status: Fetal Heart Rate (bpm): 147   Movement: Present     General:  Alert, oriented and cooperative. Patient is in no acute distress.  Skin: Skin is warm and dry. No rash noted.   Cardiovascular: Normal heart rate noted  Respiratory: Normal respiratory effort, no problems with respiration noted  Abdomen: Soft, gravid, appropriate for gestational age. Pain/Pressure: Absent     Vaginal: Vag. Bleeding: None.       Cervix: Not evaluated        Extremities: Normal range of motion.  Edema: Trace  Mental Status: Normal mood and affect. Normal behavior. Normal judgment and thought content.   Urinalysis: Urine Protein: 3+ Urine Glucose: Negative  Assessment and Plan:  Pregnancy: G4P2014 at [redacted]w[redacted]d  1. History of cesarean delivery, currently pregnant  - US OB Follow Up; Future  2. History of gestational hypertension Per pt had elevated BP just prior to del.  No further h/o elevated BP. Pt reports that she was not dx'd with preeclampsia or Gest HTN     3. Supervision of normal pregnancy in second trimester Needs f/u sono for anantomy  4. Obesity in pregnancy, antepartum, unspecified trimester   5. Abdominal cyst Right ov cyst [id number]   Preterm labor symptoms and general obstetric precautions including but not limited to vaginal bleeding, contractions, leaking of fluid and fetal movement were  reviewed in detail with the patient. Please refer to After Visit Summary for other counseling recommendations.   Return in about 4 weeks (around 05/17/2015).   Lavonia Drafts, MD

## 2015-04-19 NOTE — Progress Notes (Signed)
OB f/u US scheduled for August 5th @ 0730

## 2015-04-29 ENCOUNTER — Ambulatory Visit (HOSPITAL_COMMUNITY)
Admission: RE | Admit: 2015-04-29 | Discharge: 2015-04-29 | Disposition: A | Discharge status: Home / Self Care | Payer: Medicaid Other | Source: Ambulatory Visit | Attending: Obstetrics & Gynecology | Admitting: Obstetrics & Gynecology

## 2015-04-29 DIAGNOSIS — O34219 Maternal care for unspecified type scar from previous cesarean delivery: Secondary | ICD-10-CM

## 2015-04-29 DIAGNOSIS — Z36 Encounter for antenatal screening of mother: Secondary | ICD-10-CM | POA: Insufficient documentation

## 2015-04-29 DIAGNOSIS — O3421 Maternal care for scar from previous cesarean delivery: Secondary | ICD-10-CM | POA: Insufficient documentation

## 2015-05-05 ENCOUNTER — Other Ambulatory Visit: Payer: Self-pay | Admitting: Obstetrics & Gynecology

## 2015-05-05 DIAGNOSIS — Z3482 Encounter for supervision of other normal pregnancy, second trimester: Secondary | ICD-10-CM

## 2015-05-17 ENCOUNTER — Ambulatory Visit (INDEPENDENT_AMBULATORY_CARE_PROVIDER_SITE_OTHER): Payer: Medicaid Other | Admitting: Obstetrics & Gynecology

## 2015-05-17 VITALS — BP 124/59 | HR 88 | Temp 98.5°F | Wt 197.6 lb

## 2015-05-17 DIAGNOSIS — O34219 Maternal care for unspecified type scar from previous cesarean delivery: Secondary | ICD-10-CM

## 2015-05-17 DIAGNOSIS — Z23 Encounter for immunization: Secondary | ICD-10-CM

## 2015-05-17 DIAGNOSIS — Z3482 Encounter for supervision of other normal pregnancy, second trimester: Secondary | ICD-10-CM

## 2015-05-17 DIAGNOSIS — O3421 Maternal care for scar from previous cesarean delivery: Secondary | ICD-10-CM

## 2015-05-17 LAB — CBC
HCT: 32.8 % — ABNORMAL LOW (ref 36.0–46.0)
HEMOGLOBIN: 10.7 g/dL — AB (ref 12.0–15.0)
MCH: 26.2 pg (ref 26.0–34.0)
MCHC: 32.6 g/dL (ref 30.0–36.0)
MCV: 80.2 fL (ref 78.0–100.0)
MPV: 10.1 fL (ref 8.6–12.4)
Platelets: 437 10*3/uL — ABNORMAL HIGH (ref 150–400)
RBC: 4.09 MIL/uL (ref 3.87–5.11)
RDW: 14.6 % (ref 11.5–15.5)
WBC: 10 10*3/uL (ref 4.0–10.5)

## 2015-05-17 LAB — POCT URINALYSIS DIP (DEVICE)
Bilirubin Urine: NEGATIVE
GLUCOSE, UA: NEGATIVE mg/dL
Hgb urine dipstick: NEGATIVE
KETONES UR: NEGATIVE mg/dL
LEUKOCYTES UA: NEGATIVE
Nitrite: NEGATIVE
Protein, ur: 30 mg/dL — AB
SPECIFIC GRAVITY, URINE: 1.025 (ref 1.005–1.030)
Urobilinogen, UA: 2 mg/dL — ABNORMAL HIGH (ref 0.0–1.0)
pH: 7 (ref 5.0–8.0)

## 2015-05-17 LAB — GLUCOSE TOLERANCE, 1 HOUR (50G) W/O FASTING: Glucose, 1 Hour GTT: 111 mg/dL (ref 70–140)

## 2015-05-17 MED ORDER — TETANUS-DIPHTH-ACELL PERTUSSIS 5-2.5-18.5 LF-MCG/0.5 IM SUSP
0.5000 mL | Freq: Once | INTRAMUSCULAR | Status: AC
  Administered 2015-05-17: 0.5 mL via INTRAMUSCULAR

## 2015-05-17 NOTE — Patient Instructions (Signed)
Third Trimester of Pregnancy The third trimester is from week 29 through week 42, months 7 through 9. The third trimester is a time when the fetus is growing rapidly. At the end of the ninth month, the fetus is about 20 inches in length and weighs 6-10 pounds.  BODY CHANGES Your body goes through many changes during pregnancy. The changes vary from woman to woman.   Your weight will continue to increase. You can expect to gain 25-35 pounds (11-16 kg) by the end of the pregnancy.  You may begin to get stretch marks on your hips, abdomen, and breasts.  You may urinate more often because the fetus is moving lower into your pelvis and pressing on your bladder.  You may develop or continue to have heartburn as a result of your pregnancy.  You may develop constipation because certain hormones are causing the muscles that push waste through your intestines to slow down.  You may develop hemorrhoids or swollen, bulging veins (varicose veins).  You may have pelvic pain because of the weight gain and pregnancy hormones relaxing your joints between the bones in your pelvis. Backaches may result from overexertion of the muscles supporting your posture.  You may have changes in your hair. These can include thickening of your hair, rapid growth, and changes in texture. Some women also have hair loss during or after pregnancy, or hair that feels dry or thin. Your hair will most likely return to normal after your baby is born.  Your breasts will continue to grow and be tender. A yellow discharge may leak from your breasts called colostrum.  Your belly button may stick out.  You may feel short of breath because of your expanding uterus.  You may notice the fetus "dropping," or moving lower in your abdomen.  You may have a bloody mucus discharge. This usually occurs a few days to a week before labor begins.  Your cervix becomes thin and soft (effaced) near your due date. WHAT TO EXPECT AT YOUR PRENATAL  EXAMS  You will have prenatal exams every 2 weeks until week 36. Then, you will have weekly prenatal exams. During a routine prenatal visit:  You will be weighed to make sure you and the fetus are growing normally.  Your blood pressure is taken.  Your abdomen will be measured to track your baby's growth.  The fetal heartbeat will be listened to.  Any test results from the previous visit will be discussed.  You may have a cervical check near your due date to see if you have effaced. At around 36 weeks, your caregiver will check your cervix. At the same time, your caregiver will also perform a test on the secretions of the vaginal tissue. This test is to determine if a type of bacteria, Group B streptococcus, is present. Your caregiver will explain this further. Your caregiver may ask you:  What your birth plan is.  How you are feeling.  If you are feeling the baby move.  If you have had any abnormal symptoms, such as leaking fluid, bleeding, severe headaches, or abdominal cramping.  If you have any questions. Other tests or screenings that may be performed during your third trimester include:  Blood tests that check for low iron levels (anemia).  Fetal testing to check the health, activity level, and growth of the fetus. Testing is done if you have certain medical conditions or if there are problems during the pregnancy. FALSE LABOR You may feel small, irregular contractions that   eventually go away. These are called Braxton Hicks contractions, or false labor. Contractions may last for hours, days, or even weeks before true labor sets in. If contractions come at regular intervals, intensify, or become painful, it is best to be seen by your caregiver.  SIGNS OF LABOR   Menstrual-like cramps.  Contractions that are 5 minutes apart or less.  Contractions that start on the top of the uterus and spread down to the lower abdomen and back.  A sense of increased pelvic pressure or back  pain.  A watery or bloody mucus discharge that comes from the vagina. If you have any of these signs before the 37th week of pregnancy, call your caregiver right away. You need to go to the hospital to get checked immediately. HOME CARE INSTRUCTIONS   Avoid all smoking, herbs, alcohol, and unprescribed drugs. These chemicals affect the formation and growth of the baby.  Follow your caregiver's instructions regarding medicine use. There are medicines that are either safe or unsafe to take during pregnancy.  Exercise only as directed by your caregiver. Experiencing uterine cramps is a good sign to stop exercising.  Continue to eat regular, healthy meals.  Wear a good support bra for breast tenderness.  Do not use hot tubs, steam rooms, or saunas.  Wear your seat belt at all times when driving.  Avoid raw meat, uncooked cheese, cat litter boxes, and soil used by cats. These carry germs that can cause birth defects in the baby.  Take your prenatal vitamins.  Try taking a stool softener (if your caregiver approves) if you develop constipation. Eat more high-fiber foods, such as fresh vegetables or fruit and whole grains. Drink plenty of fluids to keep your urine clear or pale yellow.  Take warm sitz baths to soothe any pain or discomfort caused by hemorrhoids. Use hemorrhoid cream if your caregiver approves.  If you develop varicose veins, wear support hose. Elevate your feet for 15 minutes, 3-4 times a day. Limit salt in your diet.  Avoid heavy lifting, wear low heal shoes, and practice good posture.  Rest a lot with your legs elevated if you have leg cramps or low back pain.  Visit your dentist if you have not gone during your pregnancy. Use a soft toothbrush to brush your teeth and be gentle when you floss.  A sexual relationship may be continued unless your caregiver directs you otherwise.  Do not travel far distances unless it is absolutely necessary and only with the approval  of your caregiver.  Take prenatal classes to understand, practice, and ask questions about the labor and delivery.  Make a trial run to the hospital.  Pack your hospital bag.  Prepare the baby's nursery.  Continue to go to all your prenatal visits as directed by your caregiver. SEEK MEDICAL CARE IF:  You are unsure if you are in labor or if your water has broken.  You have dizziness.  You have mild pelvic cramps, pelvic pressure, or nagging pain in your abdominal area.  You have persistent nausea, vomiting, or diarrhea.  You have a bad smelling vaginal discharge.  You have pain with urination. SEEK IMMEDIATE MEDICAL CARE IF:   You have a fever.  You are leaking fluid from your vagina.  You have spotting or bleeding from your vagina.  You have severe abdominal cramping or pain.  You have rapid weight loss or gain.  You have shortness of breath with chest pain.  You notice sudden or extreme swelling   of your face, hands, ankles, feet, or legs.  You have not felt your baby move in over an hour.  You have severe headaches that do not go away with medicine.  You have vision changes. Document Released: 09/04/2001 Document Revised: 09/15/2013 Document Reviewed: 11/11/2012 Exit[employer] Patient Information 2015 [employer]. This information is not intended to replace advice given to you by your health care provider. Make sure you discuss any questions you have with your health care provider.  

## 2015-05-17 NOTE — Progress Notes (Signed)
Subjective:  Amy Kim is a 29 y.o. G6K1594 at [redacted]w[redacted]d being seen today for ongoing prenatal care.  Patient reports no complaints.  Contractions: Not present.  Vag. Bleeding: None. Movement: Present. Denies leaking of fluid.   The following portions of the patient's history were reviewed and updated as appropriate: allergies, current medications, past family history, past medical history, past social history, past surgical history and problem list.   Objective:   Filed Vitals:   05/17/15 1146  BP: 124/59  Pulse: 88  Temp: 98.5 F (36.9 C)  Weight: 197 lb 9.6 oz (89.631 kg)    Fetal Status: Fetal Heart Rate (bpm): 155   Movement: Present     General:  Alert, oriented and cooperative. Patient is in no acute distress.  Skin: Skin is warm and dry. No rash noted.   Cardiovascular: Normal heart rate noted  Respiratory: Normal respiratory effort, no problems with respiration noted  Abdomen: Soft, gravid, appropriate for gestational age. Pain/Pressure: Absent     Pelvic: Vag. Bleeding: None     Cervical exam deferred        Extremities: Normal range of motion.  Edema: Trace  Mental Status: Normal mood and affect. Normal behavior. Normal judgment and thought content.   Urinalysis:      Assessment and Plan:  Pregnancy: [id number] at [redacted]w[redacted]d  1. History of cesarean delivery, currently pregnant Repeat planned - CBC - RPR - HIV antibody (with reflex) - Glucose Tolerance, 1 HR (50g)  2. Flu vaccine need done - Flu Vaccine QUAD 36+ mos IM (Fluarix, Quad PF)  Preterm labor symptoms and general obstetric precautions including but not limited to vaginal bleeding, contractions, leaking of fluid and fetal movement were reviewed in detail with the patient. Please refer to After Visit Summary for other counseling recommendations.  Return in about 3 weeks (around 06/07/2015).   Woodroe Mode, MD

## 2015-05-17 NOTE — Progress Notes (Signed)
Edema- feet   28 wk packet given 28 wk labs today Tdap/flu vaccine consented and info given

## 2015-05-18 LAB — RPR

## 2015-05-19 LAB — HIV ANTIBODY (ROUTINE TESTING W REFLEX): HIV 1&2 Ab, 4th Generation: NONREACTIVE

## 2015-05-23 DIAGNOSIS — O99210 Obesity complicating pregnancy, unspecified trimester: Secondary | ICD-10-CM | POA: Insufficient documentation

## 2015-05-27 ENCOUNTER — Ambulatory Visit (HOSPITAL_COMMUNITY)
Admission: RE | Admit: 2015-05-27 | Discharge: 2015-05-27 | Disposition: A | Discharge status: Home / Self Care | Payer: Medicaid Other | Source: Ambulatory Visit | Attending: Obstetrics & Gynecology | Admitting: Obstetrics & Gynecology

## 2015-05-27 DIAGNOSIS — Z3482 Encounter for supervision of other normal pregnancy, second trimester: Secondary | ICD-10-CM

## 2015-06-14 ENCOUNTER — Ambulatory Visit (INDEPENDENT_AMBULATORY_CARE_PROVIDER_SITE_OTHER): Payer: Medicaid Other | Admitting: Family Medicine

## 2015-06-14 ENCOUNTER — Encounter: Payer: Self-pay | Admitting: Family Medicine

## 2015-06-14 VITALS — BP 104/70 | HR 93 | Temp 97.6°F | Wt 198.7 lb

## 2015-06-14 DIAGNOSIS — O3421 Maternal care for scar from previous cesarean delivery: Secondary | ICD-10-CM

## 2015-06-14 DIAGNOSIS — O99213 Obesity complicating pregnancy, third trimester: Secondary | ICD-10-CM | POA: Diagnosis not present

## 2015-06-14 DIAGNOSIS — Z3492 Encounter for supervision of normal pregnancy, unspecified, second trimester: Secondary | ICD-10-CM

## 2015-06-14 DIAGNOSIS — K668 Other specified disorders of peritoneum: Secondary | ICD-10-CM | POA: Diagnosis not present

## 2015-06-14 DIAGNOSIS — E669 Obesity, unspecified: Secondary | ICD-10-CM

## 2015-06-14 DIAGNOSIS — Z3483 Encounter for supervision of other normal pregnancy, third trimester: Secondary | ICD-10-CM

## 2015-06-14 DIAGNOSIS — Z8759 Personal history of other complications of pregnancy, childbirth and the puerperium: Secondary | ICD-10-CM

## 2015-06-14 DIAGNOSIS — IMO0002 Reserved for concepts with insufficient information to code with codable children: Secondary | ICD-10-CM

## 2015-06-14 DIAGNOSIS — O34219 Maternal care for unspecified type scar from previous cesarean delivery: Secondary | ICD-10-CM

## 2015-06-14 LAB — POCT URINALYSIS DIP (DEVICE)
Bilirubin Urine: NEGATIVE
GLUCOSE, UA: NEGATIVE mg/dL
Hgb urine dipstick: NEGATIVE
Leukocytes, UA: NEGATIVE
Nitrite: NEGATIVE
PH: 6.5 (ref 5.0–8.0)
PROTEIN: NEGATIVE mg/dL
SPECIFIC GRAVITY, URINE: 1.015 (ref 1.005–1.030)
UROBILINOGEN UA: 0.2 mg/dL (ref 0.0–1.0)

## 2015-06-14 NOTE — Progress Notes (Signed)
Reviewed tip of week with patient  

## 2015-06-14 NOTE — Patient Instructions (Signed)
Third Trimester of Pregnancy The third trimester is from week 29 through week 42, months 7 through 9. The third trimester is a time when the fetus is growing rapidly. At the end of the ninth month, the fetus is about 20 inches in length and weighs 6-10 pounds.  BODY CHANGES Your body goes through many changes during pregnancy. The changes vary from woman to woman.   Your weight will continue to increase. You can expect to gain 25-35 pounds (11-16 kg) by the end of the pregnancy.  You may begin to get stretch marks on your hips, abdomen, and breasts.  You may urinate more often because the fetus is moving lower into your pelvis and pressing on your bladder.  You may develop or continue to have heartburn as a result of your pregnancy.  You may develop constipation because certain hormones are causing the muscles that push waste through your intestines to slow down.  You may develop hemorrhoids or swollen, bulging veins (varicose veins).  You may have pelvic pain because of the weight gain and pregnancy hormones relaxing your joints between the bones in your pelvis. Backaches may result from overexertion of the muscles supporting your posture.  You may have changes in your hair. These can include thickening of your hair, rapid growth, and changes in texture. Some women also have hair loss during or after pregnancy, or hair that feels dry or thin. Your hair will most likely return to normal after your baby is born.  Your breasts will continue to grow and be tender. A yellow discharge may leak from your breasts called colostrum.  Your belly button may stick out.  You may feel short of breath because of your expanding uterus.  You may notice the fetus "dropping," or moving lower in your abdomen.  You may have a bloody mucus discharge. This usually occurs a few days to a week before labor begins.  Your cervix becomes thin and soft (effaced) near your due date. WHAT TO EXPECT AT YOUR PRENATAL  EXAMS  You will have prenatal exams every 2 weeks until week 36. Then, you will have weekly prenatal exams. During a routine prenatal visit:  You will be weighed to make sure you and the fetus are growing normally.  Your blood pressure is taken.  Your abdomen will be measured to track your baby's growth.  The fetal heartbeat will be listened to.  Any test results from the previous visit will be discussed.  You may have a cervical check near your due date to see if you have effaced. At around 36 weeks, your caregiver will check your cervix. At the same time, your caregiver will also perform a test on the secretions of the vaginal tissue. This test is to determine if a type of bacteria, Group B streptococcus, is present. Your caregiver will explain this further. Your caregiver may ask you:  What your birth plan is.  How you are feeling.  If you are feeling the baby move.  If you have had any abnormal symptoms, such as leaking fluid, bleeding, severe headaches, or abdominal cramping.  If you have any questions. Other tests or screenings that may be performed during your third trimester include:  Blood tests that check for low iron levels (anemia).  Fetal testing to check the health, activity level, and growth of the fetus. Testing is done if you have certain medical conditions or if there are problems during the pregnancy. FALSE LABOR You may feel small, irregular contractions that   eventually go away. These are called Braxton Hicks contractions, or false labor. Contractions may last for hours, days, or even weeks before true labor sets in. If contractions come at regular intervals, intensify, or become painful, it is best to be seen by your caregiver.  SIGNS OF LABOR   Menstrual-like cramps.  Contractions that are 5 minutes apart or less.  Contractions that start on the top of the uterus and spread down to the lower abdomen and back.  A sense of increased pelvic pressure or back  pain.  A watery or bloody mucus discharge that comes from the vagina. If you have any of these signs before the 37th week of pregnancy, call your caregiver right away. You need to go to the hospital to get checked immediately. HOME CARE INSTRUCTIONS   Avoid all smoking, herbs, alcohol, and unprescribed drugs. These chemicals affect the formation and growth of the baby.  Follow your caregiver's instructions regarding medicine use. There are medicines that are either safe or unsafe to take during pregnancy.  Exercise only as directed by your caregiver. Experiencing uterine cramps is a good sign to stop exercising.  Continue to eat regular, healthy meals.  Wear a good support bra for breast tenderness.  Do not use hot tubs, steam rooms, or saunas.  Wear your seat belt at all times when driving.  Avoid raw meat, uncooked cheese, cat litter boxes, and soil used by cats. These carry germs that can cause birth defects in the baby.  Take your prenatal vitamins.  Try taking a stool softener (if your caregiver approves) if you develop constipation. Eat more high-fiber foods, such as fresh vegetables or fruit and whole grains. Drink plenty of fluids to keep your urine clear or pale yellow.  Take warm sitz baths to soothe any pain or discomfort caused by hemorrhoids. Use hemorrhoid cream if your caregiver approves.  If you develop varicose veins, wear support hose. Elevate your feet for 15 minutes, 3-4 times a day. Limit salt in your diet.  Avoid heavy lifting, wear low heal shoes, and practice good posture.  Rest a lot with your legs elevated if you have leg cramps or low back pain.  Visit your dentist if you have not gone during your pregnancy. Use a soft toothbrush to brush your teeth and be gentle when you floss.  A sexual relationship may be continued unless your caregiver directs you otherwise.  Do not travel far distances unless it is absolutely necessary and only with the approval  of your caregiver.  Take prenatal classes to understand, practice, and ask questions about the labor and delivery.  Make a trial run to the hospital.  Pack your hospital bag.  Prepare the baby's nursery.  Continue to go to all your prenatal visits as directed by your caregiver. SEEK MEDICAL CARE IF:  You are unsure if you are in labor or if your water has broken.  You have dizziness.  You have mild pelvic cramps, pelvic pressure, or nagging pain in your abdominal area.  You have persistent nausea, vomiting, or diarrhea.  You have a bad smelling vaginal discharge.  You have pain with urination. SEEK IMMEDIATE MEDICAL CARE IF:   You have a fever.  You are leaking fluid from your vagina.  You have spotting or bleeding from your vagina.  You have severe abdominal cramping or pain.  You have rapid weight loss or gain.  You have shortness of breath with chest pain.  You notice sudden or extreme swelling   of your face, hands, ankles, feet, or legs.  You have not felt your baby move in over an hour.  You have severe headaches that do not go away with medicine.  You have vision changes. Document Released: 09/04/2001 Document Revised: 09/15/2013 Document Reviewed: 11/11/2012 Exit[employer] Patient Information 2015 [employer]. This information is not intended to replace advice given to you by your health care provider. Make sure you discuss any questions you have with your health care provider.  

## 2015-06-14 NOTE — Progress Notes (Signed)
Subjective:  Amy Kim is a 29 y.o. [id number] at [redacted]w[redacted]d being seen today for ongoing prenatal care.  Patient reports no complaints.  Contractions: Not present.  Vag. Bleeding: None. Movement: Present. Denies leaking of fluid.   The following portions of the patient's history were reviewed and updated as appropriate: allergies, current medications, past family history, past medical history, past social history, past surgical history and problem list.   Objective:   Filed Vitals:   06/14/15 1008  BP: 104/70  Pulse: 93  Temp: 97.6 F (36.4 C)  Weight: 198 lb 11.2 oz (90.13 kg)    Fetal Status: Fetal Heart Rate (bpm): 137   Movement: Present     General:  Alert, oriented and cooperative. Patient is in no acute distress.  Skin: Skin is warm and dry. No rash noted.   Cardiovascular: Normal heart rate noted  Respiratory: Normal respiratory effort, no problems with respiration noted  Abdomen: Soft, gravid, appropriate for gestational age. Pain/Pressure: Present     Pelvic: Vag. Bleeding: None     Cervical exam deferred        Extremities: Normal range of motion.  Edema: None  Mental Status: Normal mood and affect. Normal behavior. Normal judgment and thought content.   Urinalysis: Urine Protein: Negative Urine Glucose: Negative  Assessment and Plan:  Pregnancy: [id number] at [redacted]w[redacted]d  1. Supervision of normal pregnancy in second trimester -updated pregnancy box - PNC UTD  2. History of cesarean delivery, currently pregnant Repeat, two prior--> will need repeat @39  weeks (~11/17) and would like to meet the surgeons if possible Patient is jehovah's witness, no blood products  3. Obesity in pregnancy, antepartum, third trimester Early gtt pos with negative 3hr, third trimester screening negative  4. History of gestational hypertension BP is wnl today  5. Abdominal cyst Had incidental cyst seen in RUQ on anatomy scan needs [place] to characterize - US Abdomen Limited RUQ;  Future  Preterm labor symptoms and general obstetric precautions including but not limited to vaginal bleeding, contractions, leaking of fluid and fetal movement were reviewed in detail with the patient. Please refer to After Visit Summary for other counseling recommendations.  Return in about 2 weeks (around 06/28/2015) for Routine prenatal care. Caren Macadam, MD

## 2015-06-21 ENCOUNTER — Ambulatory Visit (HOSPITAL_COMMUNITY): Admission: RE | Admit: 2015-06-21 | Payer: Medicaid Other | Source: Ambulatory Visit

## 2015-06-28 ENCOUNTER — Ambulatory Visit (INDEPENDENT_AMBULATORY_CARE_PROVIDER_SITE_OTHER): Payer: Medicaid Other | Admitting: Certified Nurse Midwife

## 2015-06-28 VITALS — BP 104/52 | HR 89 | Wt 199.7 lb

## 2015-06-28 DIAGNOSIS — O34219 Maternal care for unspecified type scar from previous cesarean delivery: Secondary | ICD-10-CM

## 2015-06-28 DIAGNOSIS — Z3483 Encounter for supervision of other normal pregnancy, third trimester: Secondary | ICD-10-CM | POA: Diagnosis present

## 2015-06-28 LAB — POCT URINALYSIS DIP (DEVICE)
Bilirubin Urine: NEGATIVE
GLUCOSE, UA: NEGATIVE mg/dL
Hgb urine dipstick: NEGATIVE
LEUKOCYTES UA: NEGATIVE
NITRITE: NEGATIVE
Protein, ur: NEGATIVE mg/dL
Specific Gravity, Urine: 1.02 (ref 1.005–1.030)
UROBILINOGEN UA: 0.2 mg/dL (ref 0.0–1.0)
pH: 7 (ref 5.0–8.0)

## 2015-06-28 NOTE — Progress Notes (Signed)
Subjective:  Amy Kim is a 29 y.o. [id number] at [redacted]w[redacted]d being seen today for ongoing prenatal care.  Patient reports no complaints.  Contractions: Irritability.  Vag. Bleeding: None. Movement: Present. Denies leaking of fluid.   The following portions of the patient's history were reviewed and updated as appropriate: allergies, current medications, past family history, past medical history, past social history, past surgical history and problem list.   Objective:   Filed Vitals:   06/28/15 0855  BP: 104/52  Pulse: 89  Weight: 199 lb 11.2 oz (90.583 kg)    Fetal Status: Fetal Heart Rate (bpm): 142   Movement: Present     General:  Alert, oriented and cooperative. Patient is in no acute distress.  Skin: Skin is warm and dry. No rash noted.   Cardiovascular: Normal heart rate noted  Respiratory: Normal respiratory effort, no problems with respiration noted  Abdomen: Soft, gravid, appropriate for gestational age. Pain/Pressure: Absent     Pelvic: Vag. Bleeding: None     Cervical exam deferred        Extremities: Normal range of motion.  Edema: None  Mental Status: Normal mood and affect. Normal behavior. Normal judgment and thought content.   Urinalysis: Urine Protein: Negative Urine Glucose: Negative  Assessment and Plan:  Pregnancy: [id number] at [redacted]w[redacted]d  There are no diagnoses linked to this encounter. Preterm labor symptoms and general obstetric precautions including but not limited to vaginal bleeding, contractions, leaking of fluid and fetal movement were reviewed in detail with the patient. Please refer to After Visit Summary for other counseling recommendations.  Return in about 2 weeks (around 07/12/2015). Needs to be scheduled with a Doctor so she can meet them and have surgery scheduled  Larey Days, CNM

## 2015-06-28 NOTE — Patient Instructions (Signed)

## 2015-07-13 ENCOUNTER — Ambulatory Visit (INDEPENDENT_AMBULATORY_CARE_PROVIDER_SITE_OTHER): Payer: Medicaid Other | Admitting: Family Medicine

## 2015-07-13 ENCOUNTER — Other Ambulatory Visit (HOSPITAL_COMMUNITY)
Admission: RE | Admit: 2015-07-13 | Discharge: 2015-07-13 | Disposition: A | Discharge status: Home / Self Care | Payer: Medicaid Other | Source: Ambulatory Visit | Attending: Family Medicine | Admitting: Family Medicine

## 2015-07-13 ENCOUNTER — Encounter (HOSPITAL_COMMUNITY): Payer: Self-pay | Admitting: *Deleted

## 2015-07-13 VITALS — BP 105/42 | HR 81 | Temp 98.4°F | Wt 199.7 lb

## 2015-07-13 DIAGNOSIS — Z8759 Personal history of other complications of pregnancy, childbirth and the puerperium: Secondary | ICD-10-CM

## 2015-07-13 DIAGNOSIS — O99213 Obesity complicating pregnancy, third trimester: Secondary | ICD-10-CM | POA: Diagnosis present

## 2015-07-13 DIAGNOSIS — K668 Other specified disorders of peritoneum: Secondary | ICD-10-CM | POA: Diagnosis not present

## 2015-07-13 DIAGNOSIS — O34219 Maternal care for unspecified type scar from previous cesarean delivery: Secondary | ICD-10-CM | POA: Diagnosis not present

## 2015-07-13 DIAGNOSIS — E669 Obesity, unspecified: Secondary | ICD-10-CM

## 2015-07-13 DIAGNOSIS — IMO0002 Reserved for concepts with insufficient information to code with codable children: Secondary | ICD-10-CM

## 2015-07-13 DIAGNOSIS — Z3492 Encounter for supervision of normal pregnancy, unspecified, second trimester: Secondary | ICD-10-CM

## 2015-07-13 DIAGNOSIS — Z113 Encounter for screening for infections with a predominantly sexual mode of transmission: Secondary | ICD-10-CM | POA: Insufficient documentation

## 2015-07-13 LAB — POCT URINALYSIS DIP (DEVICE)
Glucose, UA: NEGATIVE mg/dL
LEUKOCYTES UA: NEGATIVE
NITRITE: NEGATIVE
PH: 7 (ref 5.0–8.0)
PROTEIN: NEGATIVE mg/dL
SPECIFIC GRAVITY, URINE: 1.02 (ref 1.005–1.030)
UROBILINOGEN UA: 1 mg/dL (ref 0.0–1.0)

## 2015-07-13 LAB — OB RESULTS CONSOLE GC/CHLAMYDIA: Gonorrhea: NEGATIVE

## 2015-07-13 LAB — OB RESULTS CONSOLE GBS: GBS: NEGATIVE

## 2015-07-13 NOTE — Progress Notes (Signed)
Subjective:  Amy Kim is a 29 y.o. [id number] at [redacted]w[redacted]d being seen today for ongoing prenatal care.  Patient reports no complaints.  Contractions: Irregular.  Vag. Bleeding: None. Movement: Present. Denies leaking of fluid.   The following portions of the patient's history were reviewed and updated as appropriate: allergies, current medications, past family history, past medical history, past social history, past surgical history and problem list. Problem list updated.  Objective:   Filed Vitals:   07/13/15 0907  BP: 105/42  Pulse: 81  Temp: 98.4 F (36.9 C)  Weight: 199 lb 11.2 oz (90.583 kg)    Fetal Status: Fetal Heart Rate (bpm): 160   Movement: Present     General:  Alert, oriented and cooperative. Patient is in no acute distress.  Skin: Skin is warm and dry. No rash noted.   Cardiovascular: Normal heart rate noted  Respiratory: Normal respiratory effort, no problems with respiration noted  Abdomen: Soft, gravid, appropriate for gestational age. Pain/Pressure: Present     Pelvic: Vag. Bleeding: None     Cervical exam deferred        Extremities: Normal range of motion.  Edema: None  Mental Status: Normal mood and affect. Normal behavior. Normal judgment and thought content.   Urinalysis: Urine Protein: Negative Urine Glucose: Negative  Assessment and Plan:  Pregnancy: [id number] at [redacted]w[redacted]d  1. Supervision of normal pregnancy in second trimester - updated pregnancy box  2. History of cesarean delivery, currently pregnant -needs RCS scheduled 11/16 or 11/17, sent message to [place] to schedule  3. Obesity in pregnancy, antepartum, third trimester  4. Abdominal cyst -needs repeat US  5. History of gestational hypertension BP controlled today  Preterm labor symptoms and general obstetric precautions including but not limited to vaginal bleeding, contractions, leaking of fluid and fetal movement were reviewed in detail with the patient. Please refer to After Visit  Summary for other counseling recommendations.  Return in about 2 weeks (around 07/27/2015) for Routine prenatal care.   Caren Macadam, MD

## 2015-07-13 NOTE — Patient Instructions (Signed)

## 2015-07-13 NOTE — Progress Notes (Signed)
Breastfeeding tip of week reviewed.

## 2015-07-14 ENCOUNTER — Other Ambulatory Visit: Payer: Self-pay | Admitting: Family Medicine

## 2015-07-14 LAB — GC/CHLAMYDIA PROBE AMP (~~LOC~~) NOT AT ARMC
Chlamydia: NEGATIVE
Neisseria Gonorrhea: NEGATIVE

## 2015-07-15 LAB — CULTURE, BETA STREP (GROUP B ONLY)

## 2015-07-27 ENCOUNTER — Ambulatory Visit (INDEPENDENT_AMBULATORY_CARE_PROVIDER_SITE_OTHER): Payer: Medicaid Other | Admitting: Advanced Practice Midwife

## 2015-07-27 VITALS — BP 102/59 | HR 87 | Temp 98.4°F | Wt 201.2 lb

## 2015-07-27 DIAGNOSIS — Z3483 Encounter for supervision of other normal pregnancy, third trimester: Secondary | ICD-10-CM | POA: Diagnosis present

## 2015-07-27 DIAGNOSIS — IMO0002 Reserved for concepts with insufficient information to code with codable children: Secondary | ICD-10-CM

## 2015-07-27 DIAGNOSIS — Z3492 Encounter for supervision of normal pregnancy, unspecified, second trimester: Secondary | ICD-10-CM

## 2015-07-27 LAB — POCT URINALYSIS DIP (DEVICE)
Bilirubin Urine: NEGATIVE
GLUCOSE, UA: NEGATIVE mg/dL
Ketones, ur: 15 mg/dL — AB
Leukocytes, UA: NEGATIVE
NITRITE: NEGATIVE
PROTEIN: 30 mg/dL — AB
SPECIFIC GRAVITY, URINE: 1.02 (ref 1.005–1.030)
UROBILINOGEN UA: 2 mg/dL — AB (ref 0.0–1.0)
pH: 7 (ref 5.0–8.0)

## 2015-07-27 NOTE — Progress Notes (Signed)
Moderate hemoglobin noted in urinalysis.

## 2015-07-28 ENCOUNTER — Ambulatory Visit (HOSPITAL_COMMUNITY)
Admission: RE | Admit: 2015-07-28 | Discharge: 2015-07-28 | Disposition: A | Discharge status: Home / Self Care | Payer: Medicaid Other | Source: Ambulatory Visit | Attending: Advanced Practice Midwife | Admitting: Advanced Practice Midwife

## 2015-07-28 DIAGNOSIS — O26892 Other specified pregnancy related conditions, second trimester: Secondary | ICD-10-CM | POA: Insufficient documentation

## 2015-07-28 DIAGNOSIS — IMO0002 Reserved for concepts with insufficient information to code with codable children: Secondary | ICD-10-CM

## 2015-07-28 DIAGNOSIS — K668 Other specified disorders of peritoneum: Secondary | ICD-10-CM | POA: Diagnosis not present

## 2015-07-28 DIAGNOSIS — Z3492 Encounter for supervision of normal pregnancy, unspecified, second trimester: Secondary | ICD-10-CM

## 2015-07-28 NOTE — Progress Notes (Signed)
Subjective:  Amy Kim is a 29 y.o. [id number] at [redacted]w[redacted]d being seen today for ongoing prenatal care.  Patient reports no complaints.  Contractions: Irregular.  Vag. Bleeding: None. Movement: Present. Denies leaking of fluid.   The following portions of the patient's history were reviewed and updated as appropriate: allergies, current medications, past family history, past medical history, past social history, past surgical history and problem list. Problem list updated.  Objective:   Filed Vitals:   07/27/15 0949  BP: 102/59  Pulse: 87  Temp: 98.4 F (36.9 C)  Weight: 201 lb 3.2 oz (91.264 kg)    Fetal Status: Fetal Heart Rate (bpm): 130 Fundal Height: 37 cm Movement: Present     General:  Alert, oriented and cooperative. Patient is in no acute distress.  Skin: Skin is warm and dry. No rash noted.   Cardiovascular: Normal heart rate noted  Respiratory: Normal respiratory effort, no problems with respiration noted  Abdomen: Soft, gravid, appropriate for gestational age. Pain/Pressure: Present     Pelvic: Vag. Bleeding: None     Cervical exam deferred        Extremities: Normal range of motion.  Edema: None  Mental Status: Normal mood and affect. Normal behavior. Normal judgment and thought content.   Urinalysis: Urine Protein: 1+ Urine Glucose: Negative  Assessment and Plan:  Pregnancy: [id number] at [redacted]w[redacted]d  1. Abdominal cyst  - US Abdomen Limited RUQ; Future  2. Supervision of normal pregnancy in second trimester  - US Abdomen Limited RUQ; Future  Term labor symptoms and general obstetric precautions including but not limited to vaginal bleeding, contractions, leaking of fluid and fetal movement were reviewed in detail with the patient. Please refer to After Visit Summary for other counseling recommendations.  No Follow-up on file.   Elvera Maria, CNM

## 2015-08-03 ENCOUNTER — Ambulatory Visit (INDEPENDENT_AMBULATORY_CARE_PROVIDER_SITE_OTHER): Payer: Medicaid Other | Admitting: Certified Nurse Midwife

## 2015-08-03 VITALS — BP 99/51 | HR 87 | Temp 98.2°F | Wt 202.0 lb

## 2015-08-03 DIAGNOSIS — Z3483 Encounter for supervision of other normal pregnancy, third trimester: Secondary | ICD-10-CM | POA: Diagnosis present

## 2015-08-03 LAB — POCT URINALYSIS DIP (DEVICE)
Glucose, UA: NEGATIVE mg/dL
Hgb urine dipstick: NEGATIVE
Ketones, ur: NEGATIVE mg/dL
Leukocytes, UA: NEGATIVE
Nitrite: NEGATIVE
Protein, ur: NEGATIVE mg/dL
Specific Gravity, Urine: 1.02 (ref 1.005–1.030)
Urobilinogen, UA: 2 mg/dL — ABNORMAL HIGH (ref 0.0–1.0)
pH: 7 (ref 5.0–8.0)

## 2015-08-03 NOTE — Patient Instructions (Signed)

## 2015-08-03 NOTE — Progress Notes (Signed)
Patient reports decreased FM for past 3 days Reviewed tip of week with patient

## 2015-08-03 NOTE — Progress Notes (Signed)
Subjective:  Amy Kim is a 29 y.o. [id number] at [redacted]w[redacted]d being seen today for ongoing prenatal care.  Patient reports no complaints.  Contractions: Irregular.  Vag. Bleeding: None. Movement: (!) Decreased. Denies leaking of fluid.   The following portions of the patient's history were reviewed and updated as appropriate: allergies, current medications, past family history, past medical history, past social history, past surgical history and problem list. Problem list updated.  Objective:   Filed Vitals:   08/03/15 0928  BP: 99/51  Pulse: 87  Temp: 98.2 F (36.8 C)  Weight: 202 lb (91.627 kg)    Fetal Status: Fetal Heart Rate (bpm): 140   Movement: (!) Decreased     General:  Alert, oriented and cooperative. Patient is in no acute distress.  Skin: Skin is warm and dry. No rash noted.   Cardiovascular: Normal heart rate noted  Respiratory: Normal respiratory effort, no problems with respiration noted  Abdomen: Soft, gravid, appropriate for gestational age. Pain/Pressure: Present     Pelvic: Vag. Bleeding: None Vag D/C Character: White   Cervical exam deferred        Extremities: Normal range of motion.  Edema: None  Mental Status: Normal mood and affect. Normal behavior. Normal judgment and thought content.   Urinalysis: Urine Protein: Negative Urine Glucose: Negative  Assessment and Plan:  Pregnancy: [id number] at [redacted]w[redacted]d  There are no diagnoses linked to this encounter. Term labor symptoms and general obstetric precautions including but not limited to vaginal bleeding, contractions, leaking of fluid and fetal movement were reviewed in detail with the patient. Please refer to After Visit Summary for other counseling recommendations.  No Follow-up on file.   Larey Days, CNM

## 2015-08-10 ENCOUNTER — Encounter: Payer: Self-pay | Admitting: Physician Assistant

## 2015-08-10 ENCOUNTER — Ambulatory Visit (INDEPENDENT_AMBULATORY_CARE_PROVIDER_SITE_OTHER): Payer: Medicaid Other | Admitting: Physician Assistant

## 2015-08-10 ENCOUNTER — Encounter (HOSPITAL_COMMUNITY)
Admission: RE | Admit: 2015-08-10 | Discharge: 2015-08-10 | Disposition: A | Discharge status: Home / Self Care | Payer: Medicaid Other | Source: Ambulatory Visit | Attending: Family Medicine | Admitting: Family Medicine

## 2015-08-10 ENCOUNTER — Encounter (HOSPITAL_COMMUNITY): Payer: Self-pay

## 2015-08-10 VITALS — BP 104/72 | HR 89 | Temp 98.6°F | Ht 61.0 in | Wt 204.0 lb

## 2015-08-10 VITALS — BP 109/63 | HR 81 | Temp 142.0°F | Wt 202.6 lb

## 2015-08-10 DIAGNOSIS — Z3492 Encounter for supervision of normal pregnancy, unspecified, second trimester: Secondary | ICD-10-CM

## 2015-08-10 DIAGNOSIS — Z8759 Personal history of other complications of pregnancy, childbirth and the puerperium: Secondary | ICD-10-CM

## 2015-08-10 DIAGNOSIS — Z3482 Encounter for supervision of other normal pregnancy, second trimester: Secondary | ICD-10-CM

## 2015-08-10 DIAGNOSIS — O34219 Maternal care for unspecified type scar from previous cesarean delivery: Secondary | ICD-10-CM

## 2015-08-10 LAB — CBC
HCT: 34.3 % — ABNORMAL LOW (ref 36.0–46.0)
HEMOGLOBIN: 11.2 g/dL — AB (ref 12.0–15.0)
MCH: 26 pg (ref 26.0–34.0)
MCHC: 32.7 g/dL (ref 30.0–36.0)
MCV: 79.8 fL (ref 78.0–100.0)
PLATELETS: 429 10*3/uL — AB (ref 150–400)
RBC: 4.3 MIL/uL (ref 3.87–5.11)
RDW: 15 % (ref 11.5–15.5)
WBC: 9 10*3/uL (ref 4.0–10.5)

## 2015-08-10 LAB — POCT URINALYSIS DIP (DEVICE)
GLUCOSE, UA: NEGATIVE mg/dL
Hgb urine dipstick: NEGATIVE
LEUKOCYTES UA: NEGATIVE
NITRITE: NEGATIVE
Protein, ur: 30 mg/dL — AB
Specific Gravity, Urine: 1.02 (ref 1.005–1.030)
UROBILINOGEN UA: 1 mg/dL (ref 0.0–1.0)
pH: 7 (ref 5.0–8.0)

## 2015-08-10 LAB — RAPID HIV SCREEN (HIV 1/2 AB+AG)
HIV 1/2 ANTIBODIES: NONREACTIVE
HIV-1 P24 ANTIGEN - HIV24: NONREACTIVE

## 2015-08-10 LAB — TYPE AND SCREEN
ABO/RH(D): B POS
Antibody Screen: NEGATIVE

## 2015-08-10 LAB — ABO/RH: ABO/RH(D): B POS

## 2015-08-10 NOTE — Patient Instructions (Signed)
Your procedure is scheduled on:08/11/15  Enter through the Main Entrance at :[place] up desk phone and dial [phone] and inform us of your arrival.  Please call [phone] if you have any problems the morning of surgery.  Remember: Do not eat food or drink liquids after midnight:tonight Water is ok until:5:30 am Thursday   You may brush your teeth the morning of surgery.    DO NOT wear jewelry, eye make-up, lipstick,body lotion, or dark fingernail polish.  (Polished toes are ok) You may wear deodorant.  If you are to be admitted after surgery, leave suitcase in car until your room has been assigned. Patients discharged on the day of surgery will not be allowed to drive home. Wear loose fitting, comfortable clothes for your ride home.

## 2015-08-10 NOTE — Progress Notes (Signed)
csection tomorrow!

## 2015-08-10 NOTE — Progress Notes (Signed)
Subjective:  Amy Kim is a 29 y.o. [id number] at [redacted]w[redacted]d being seen today for ongoing prenatal care.  Patient reports no complaints.  Contractions: Not present.  Vag. Bleeding: None. Movement: Present. Denies leaking of fluid.   The following portions of the patient's history were reviewed and updated as appropriate: allergies, current medications, past family history, past medical history, past social history, past surgical history and problem list.   Objective:   Filed Vitals:   08/10/15 1107  BP: 109/63  Pulse: 81  Temp: 142 F (61.1 C)  Weight: 202 lb 9.6 oz (91.899 kg)    Fetal Status: Fetal Heart Rate (bpm): 142 Fundal Height: 39 cm Movement: Present     General:  Alert, oriented and cooperative. Patient is in no acute distress.  Skin: Skin is warm and dry. No rash noted.   Cardiovascular: Normal heart rate noted  Respiratory: Normal respiratory effort, no problems with respiration noted  Abdomen: Soft, gravid, appropriate for gestational age. Pain/Pressure: Absent     Pelvic: Vag. Bleeding: None     Cervical exam deferred        Extremities: Normal range of motion.  Edema: None  Mental Status: Normal mood and affect. Normal behavior. Normal judgment and thought content.   Urinalysis: Urine Protein: 1+ Urine Glucose: Negative  Assessment and Plan:  Pregnancy: [id number] at [redacted]w[redacted]d  1. Supervision of normal pregnancy in second trimester Csection scheduled for tomorrow.  Postpartum visit in 6 weeks  Term labor symptoms and general obstetric precautions including but not limited to vaginal bleeding, contractions, leaking of fluid and fetal movement were reviewed in detail with the patient. Please refer to After Visit Summary for other counseling recommendations.  Return in about 6 weeks (around 09/21/2015) for postpartum.   Paticia Stack, PA-C

## 2015-08-10 NOTE — Patient Instructions (Signed)
Postpartum Care After Cesarean Delivery After you deliver your newborn (postpartum period), the usual stay in the hospital is 24-72 hours. If there were problems with your labor or delivery, or if you have other medical problems, you might be in the hospital longer.  While you are in the hospital, you will receive help and instructions on how to care for yourself and your newborn during the postpartum period.  While you are in the hospital:  It is normal for you to have pain or discomfort from the incision in your abdomen. Be sure to tell your nurses when you are having pain, where the pain is located, and what makes the pain worse.  If you are breastfeeding, you may feel uncomfortable contractions of your uterus for a couple of weeks. This is normal. The contractions help your uterus get back to normal size.  It is normal to have some bleeding after delivery.  For the first 1-3 days after delivery, the flow is red and the amount may be similar to a period.  It is common for the flow to start and stop.  In the first few days, you may pass some small clots. Let your nurses know if you begin to pass large clots or your flow increases.  Do not  flush blood clots down the toilet before having the nurse look at them.  During the next 3-10 days after delivery, your flow should become more watery and pink or brown-tinged in color.  Ten to fourteen days after delivery, your flow should be a small amount of yellowish-white discharge.  The amount of your flow will decrease over the first few weeks after delivery. Your flow may stop in 6-8 weeks. Most women have had their flow stop by 12 weeks after delivery.  You should change your sanitary pads frequently.  Wash your hands thoroughly with soap and water for at least 20 seconds after changing pads, using the toilet, or before holding or feeding your newborn.  Your intravenous (IV) tubing will be removed when you are drinking enough fluids.  The  urine drainage tube (urinary catheter) that was inserted before delivery may be removed within 6-8 hours after delivery or when feeling returns to your legs. You should feel like you need to empty your bladder within the first 6-8 hours after the catheter has been removed.  In case you become weak, lightheaded, or faint, call your nurse before you get out of bed for the first time and before you take a shower for the first time.  Within the first few days after delivery, your breasts may begin to feel tender and full. This is called engorgement. Breast tenderness usually goes away within 48-72 hours after engorgement occurs. You may also notice milk leaking from your breasts. If you are not breastfeeding, do not stimulate your breasts. Breast stimulation can make your breasts produce more milk.  Spending as much time as possible with your newborn is very important. During this time, you and your newborn can feel close and get to know each other. Having your newborn stay in your room (rooming in) will help to strengthen the bond with your newborn. It will give you time to get to know your newborn and become comfortable caring for your newborn.  Your hormones change after delivery. Sometimes the hormone changes can temporarily cause you to feel sad or tearful. These feelings should not last more than a few days. If these feelings last longer than that, you should talk to your  caregiver.  If desired, talk to your caregiver about methods of family planning or contraception.  Talk to your caregiver about immunizations. Your caregiver may want you to have the following immunizations before leaving the hospital:  Tetanus, diphtheria, and pertussis (Tdap) or tetanus and diphtheria (Td) immunization. It is very important that you and your family (including grandparents) or others caring for your newborn are up-to-date with the Tdap or Td immunizations. The Tdap or Td immunization can help protect your newborn  from getting ill.  Rubella immunization.  Varicella (chickenpox) immunization.  Influenza immunization. You should receive this annual immunization if you did not receive the immunization during your pregnancy.   This information is not intended to replace advice given to you by your health care provider. Make sure you discuss any questions you have with your health care provider.   Document Released: 06/04/2012 Document Reviewed: 06/04/2012 Elsevier Interactive Patient Education [employer]. Cesarean Delivery Cesarean delivery is the birth of a baby through a cut (incision) in the abdomen and womb (uterus).  LET [facility] CARE PROVIDER KNOW ABOUT:  All medicines you are taking, including vitamins, herbs, eye drops, creams, and over-the-counter medicines.  Previous problems you or members of your family have had with the use of anesthetics.  Any bleeding or blood clotting disorders you have.  Family history of blood clots or bleeding disorders.  Any history of deep vein thrombosis (DVT) or pulmonary embolism (PE).  Previous surgeries you have had.  Medical conditions you have.  Any allergies you have.  Complicationsinvolving the pregnancy. RISKS AND COMPLICATIONS  Generally, this is a safe procedure. However, as with any procedure, complications can occur. Possible complications include:  Bleeding.  Infection.  Blood clots.  Injury to surrounding organs.  Problems with anesthesia.  Injury to the baby. BEFORE THE PROCEDURE   You may be given an antacid medicine to drink. This will prevent acid contents in your stomach from going into your lungs if you vomit during the surgery.  You may be given an antibiotic medicine to prevent infection. PROCEDURE   To prevent infection of your incision:  Hair may be removed from your pubic area if it is near your incision.  The skin of your pubic area and lower abdomen will be cleaned with a germ-killing solution  (antiseptic).  A tube (Foley catheter) will be placed in your bladder to drain your urine from your bladder into a bag. This keeps your bladder empty during surgery.  An IV tube will be placed in your vein.  You may be given medicine to numb the lower half of your body (regional anesthetic). If you were in labor, you may have already had an epidural in place which can be used in both labor and cesarean delivery. You may possibly be given medicine to make you sleep (general anesthetic) though this is not as common.  Your heart rate and your baby's heart rate will be monitored.  An incision will be made in your abdomen that extends to your uterus. There are 2 basic kinds of incisions:  The horizontal (transverse) incision. Horizontal incisions are from side to side and are used for most routine cesarean deliveries.  The vertical incision. The vertical incision is from the top of the abdomen to the bottom and is less commonly used. It is often done for women who have a serious complication (extreme prematurity) or under emergency situations.  The horizontal and vertical incisions may both be used at the same time. However,  this is very uncommon.  An incision is then made in your uterus to deliver the baby.  Your baby will be delivered.  Your health care provider may place the baby on your chest. It is important to keep the baby warm. Your health care provider will dry off the baby, place the baby directly on your bare skin, and cover the baby with warm, dry blankets.  Both incisions will be closed with absorbable stitches. AFTER THE PROCEDURE   If you were awake during the surgery, you will see your baby right away. If you were asleep, you will see your baby as soon as you are awake.  You may breastfeed your baby after surgery.  You may be able to get up and walk the same day as the surgery. If you need to stay in bed for a period of time, you will receive help to turn, cough, and take  deep breaths after surgery. This helps prevent lung problems such as pneumonia.  Do not get out of bed alone the first time after surgery. You will need help getting out of bed until you are able to do this by yourself.  You may be able to shower the day after your cesarean delivery. After the bandage (dressing) is taken off the incision site, a nurse will assist you to shower if you would like help.  You may be directed to take actions to help prevent blood clots in your legs. These may include:  Walking shortly after surgery, with someone assisting you. Moving around after surgery helps to improve blood flow.  Wearing compression stockings or using different types of devices.  Taking medicines to thin your blood (anticoagulants) if you are at high risk for DVT or PE.  Save any blood clots that you pass from your vagina. If you pass a clot while on the toilet, do not flush it. Call for the nurse. Tell the nurse if you think you are bleeding too much or passing too many clots.  You will be given medicine for pain and nausea as needed. Let your health care providers know if you are hurting. You may also be given an antibiotic to prevent an infection.  Your IV tube will be taken out when you are drinking a reasonable amount of fluids. The Foley catheter is taken out when you are up and walking.  If your blood type is Rh negative and your baby's blood type is Rh positive, you will be given a shot of anti-D immune globulin. This shot prevents you from having Rh problems with a future pregnancy. You should get the shot even if you had your tubes tied (tubal ligation).  If you are allowed to take the baby for a walk, place the baby in the bassinet and push it.   This information is not intended to replace advice given to you by your health care provider. Make sure you discuss any questions you have with your health care provider.   Document Released: 09/10/2005 Document Revised: 06/01/2015  Document Reviewed: 05/07/2012 Elsevier Interactive Patient Education [employer].

## 2015-08-11 ENCOUNTER — Encounter (HOSPITAL_COMMUNITY)
Admission: RE | Disposition: A | Discharge status: Home / Self Care | Payer: Self-pay | Source: Ambulatory Visit | Attending: Family Medicine

## 2015-08-11 ENCOUNTER — Inpatient Hospital Stay (HOSPITAL_COMMUNITY): Payer: Medicaid Other | Admitting: Anesthesiology

## 2015-08-11 ENCOUNTER — Inpatient Hospital Stay (HOSPITAL_COMMUNITY)
Admission: RE | Admit: 2015-08-11 | Discharge: 2015-08-13 | DRG: 766 | Disposition: A | Discharge status: Home / Self Care | Payer: Medicaid Other | Source: Ambulatory Visit | Attending: Family Medicine | Admitting: Family Medicine

## 2015-08-11 ENCOUNTER — Encounter (HOSPITAL_COMMUNITY): Payer: Self-pay | Admitting: Anesthesiology

## 2015-08-11 DIAGNOSIS — O358XX0 Maternal care for other (suspected) fetal abnormality and damage, not applicable or unspecified: Secondary | ICD-10-CM | POA: Diagnosis present

## 2015-08-11 DIAGNOSIS — O34211 Maternal care for low transverse scar from previous cesarean delivery: Secondary | ICD-10-CM | POA: Diagnosis present

## 2015-08-11 DIAGNOSIS — O34219 Maternal care for unspecified type scar from previous cesarean delivery: Secondary | ICD-10-CM

## 2015-08-11 DIAGNOSIS — Z3A39 39 weeks gestation of pregnancy: Secondary | ICD-10-CM

## 2015-08-11 DIAGNOSIS — Z6838 Body mass index (BMI) 38.0-38.9, adult: Secondary | ICD-10-CM | POA: Diagnosis not present

## 2015-08-11 DIAGNOSIS — O99210 Obesity complicating pregnancy, unspecified trimester: Secondary | ICD-10-CM | POA: Diagnosis present

## 2015-08-11 DIAGNOSIS — Z8249 Family history of ischemic heart disease and other diseases of the circulatory system: Secondary | ICD-10-CM | POA: Diagnosis not present

## 2015-08-11 DIAGNOSIS — Z3492 Encounter for supervision of normal pregnancy, unspecified, second trimester: Secondary | ICD-10-CM

## 2015-08-11 DIAGNOSIS — E669 Obesity, unspecified: Secondary | ICD-10-CM | POA: Diagnosis present

## 2015-08-11 DIAGNOSIS — O99214 Obesity complicating childbirth: Secondary | ICD-10-CM | POA: Diagnosis present

## 2015-08-11 DIAGNOSIS — O134 Gestational [pregnancy-induced] hypertension without significant proteinuria, complicating childbirth: Secondary | ICD-10-CM | POA: Diagnosis present

## 2015-08-11 DIAGNOSIS — Z98891 History of uterine scar from previous surgery: Secondary | ICD-10-CM | POA: Diagnosis present

## 2015-08-11 DIAGNOSIS — Z8759 Personal history of other complications of pregnancy, childbirth and the puerperium: Secondary | ICD-10-CM

## 2015-08-11 LAB — RPR: RPR Ser Ql: NONREACTIVE

## 2015-08-11 SURGERY — Surgical Case
Anesthesia: Spinal | Site: Abdomen

## 2015-08-11 MED ORDER — NALOXONE HCL 2 MG/2ML IJ SOSY
1.0000 ug/kg/h | PREFILLED_SYRINGE | INTRAMUSCULAR | Status: DC | PRN
  Filled 2015-08-11: qty 2

## 2015-08-11 MED ORDER — ONDANSETRON HCL 4 MG/2ML IJ SOLN
INTRAMUSCULAR | Status: DC | PRN
  Administered 2015-08-11: 4 mg via INTRAVENOUS

## 2015-08-11 MED ORDER — MORPHINE SULFATE (PF) 0.5 MG/ML IJ SOLN
INTRAMUSCULAR | Status: AC
  Filled 2015-08-11: qty 10

## 2015-08-11 MED ORDER — LACTATED RINGERS IV SOLN
INTRAVENOUS | Status: DC
  Administered 2015-08-11: 22:00:00 via INTRAVENOUS

## 2015-08-11 MED ORDER — SIMETHICONE 80 MG PO CHEW
80.0000 mg | CHEWABLE_TABLET | ORAL | Status: DC
  Administered 2015-08-11 – 2015-08-12 (×2): 80 mg via ORAL
  Filled 2015-08-11 (×2): qty 1

## 2015-08-11 MED ORDER — SODIUM CHLORIDE 0.9 % IR SOLN
Status: DC | PRN
  Administered 2015-08-11: 1000 mL

## 2015-08-11 MED ORDER — DIBUCAINE 1 % RE OINT: 1.0000 "application " | TOPICAL_OINTMENT | RECTAL | Status: DC | PRN

## 2015-08-11 MED ORDER — CEFAZOLIN SODIUM-DEXTROSE 2-3 GM-% IV SOLR
2.0000 g | INTRAVENOUS | Status: AC
  Administered 2015-08-11: 2 g via INTRAVENOUS

## 2015-08-11 MED ORDER — PHENYLEPHRINE 8 MG IN D5W 100 ML (0.08MG/ML) PREMIX OPTIME
INJECTION | INTRAVENOUS | Status: AC
  Filled 2015-08-11: qty 100

## 2015-08-11 MED ORDER — IBUPROFEN 600 MG PO TABS
600.0000 mg | ORAL_TABLET | Freq: Four times a day (QID) | ORAL | Status: DC
  Administered 2015-08-11 – 2015-08-13 (×7): 600 mg via ORAL
  Filled 2015-08-11 (×7): qty 1

## 2015-08-11 MED ORDER — LACTATED RINGERS IV SOLN
INTRAVENOUS | Status: DC | PRN
  Administered 2015-08-11: 13:00:00 via INTRAVENOUS

## 2015-08-11 MED ORDER — PHENYLEPHRINE 8 MG IN D5W 100 ML (0.08MG/ML) PREMIX OPTIME
INJECTION | INTRAVENOUS | Status: DC | PRN
  Administered 2015-08-11: 60 ug/min via INTRAVENOUS

## 2015-08-11 MED ORDER — OXYCODONE-ACETAMINOPHEN 5-325 MG PO TABS
1.0000 | ORAL_TABLET | ORAL | Status: DC | PRN
Start: 2015-08-11 — End: 2015-08-13
  Administered 2015-08-13: 1 via ORAL
  Filled 2015-08-11: qty 1

## 2015-08-11 MED ORDER — LACTATED RINGERS IV SOLN
40.0000 [IU] | INTRAVENOUS | Status: DC | PRN
  Administered 2015-08-11: 40 [IU] via INTRAVENOUS

## 2015-08-11 MED ORDER — ONDANSETRON HCL 4 MG/2ML IJ SOLN
INTRAMUSCULAR | Status: AC
  Filled 2015-08-11: qty 2

## 2015-08-11 MED ORDER — NALBUPHINE HCL 10 MG/ML IJ SOLN: 5.0000 mg | Freq: Once | INTRAMUSCULAR | Status: DC | PRN

## 2015-08-11 MED ORDER — TETANUS-DIPHTH-ACELL PERTUSSIS 5-2.5-18.5 LF-MCG/0.5 IM SUSP: 0.5000 mL | Freq: Once | INTRAMUSCULAR | Status: DC

## 2015-08-11 MED ORDER — DIPHENHYDRAMINE HCL 50 MG/ML IJ SOLN
INTRAMUSCULAR | Status: DC | PRN
  Administered 2015-08-11: 12.5 mg via INTRAVENOUS

## 2015-08-11 MED ORDER — LANOLIN HYDROUS EX OINT: 1.0000 "application " | TOPICAL_OINTMENT | CUTANEOUS | Status: DC | PRN

## 2015-08-11 MED ORDER — MENTHOL 3 MG MT LOZG: 1.0000 | LOZENGE | OROMUCOSAL | Status: DC | PRN

## 2015-08-11 MED ORDER — FENTANYL CITRATE (PF) 100 MCG/2ML IJ SOLN: 25.0000 ug | INTRAMUSCULAR | Status: DC | PRN

## 2015-08-11 MED ORDER — NALBUPHINE HCL 10 MG/ML IJ SOLN: 5.0000 mg | INTRAMUSCULAR | Status: DC | PRN

## 2015-08-11 MED ORDER — SIMETHICONE 80 MG PO CHEW: 80.0000 mg | CHEWABLE_TABLET | ORAL | Status: DC | PRN

## 2015-08-11 MED ORDER — OXYCODONE-ACETAMINOPHEN 5-325 MG PO TABS: 2.0000 | ORAL_TABLET | ORAL | Status: DC | PRN

## 2015-08-11 MED ORDER — CEFAZOLIN SODIUM-DEXTROSE 2-3 GM-% IV SOLR
INTRAVENOUS | Status: AC
  Filled 2015-08-11: qty 50

## 2015-08-11 MED ORDER — ACETAMINOPHEN 325 MG PO TABS: 650.0000 mg | ORAL_TABLET | ORAL | Status: DC | PRN

## 2015-08-11 MED ORDER — DIPHENHYDRAMINE HCL 25 MG PO CAPS: 25.0000 mg | ORAL_CAPSULE | Freq: Four times a day (QID) | ORAL | Status: DC | PRN

## 2015-08-11 MED ORDER — SCOPOLAMINE 1 MG/3DAYS TD PT72: 1.0000 | MEDICATED_PATCH | Freq: Once | TRANSDERMAL | Status: DC

## 2015-08-11 MED ORDER — NALOXONE HCL 0.4 MG/ML IJ SOLN: 0.4000 mg | INTRAMUSCULAR | Status: DC | PRN

## 2015-08-11 MED ORDER — FENTANYL CITRATE (PF) 100 MCG/2ML IJ SOLN
INTRAMUSCULAR | Status: DC | PRN
  Administered 2015-08-11: 10 ug via INTRATHECAL

## 2015-08-11 MED ORDER — ZOLPIDEM TARTRATE 5 MG PO TABS
5.0000 mg | ORAL_TABLET | Freq: Every evening | ORAL | Status: DC | PRN
Start: 2015-08-11 — End: 2015-08-13

## 2015-08-11 MED ORDER — SODIUM CHLORIDE 0.9 % IJ SOLN: 3.0000 mL | INTRAMUSCULAR | Status: DC | PRN

## 2015-08-11 MED ORDER — BACITRACIN ZINC 500 UNIT/GM EX OINT
TOPICAL_OINTMENT | CUTANEOUS | Status: AC
  Filled 2015-08-11: qty 28.35

## 2015-08-11 MED ORDER — ONDANSETRON HCL 4 MG/2ML IJ SOLN: 4.0000 mg | Freq: Three times a day (TID) | INTRAMUSCULAR | Status: DC | PRN

## 2015-08-11 MED ORDER — LACTATED RINGERS IV SOLN
Freq: Once | INTRAVENOUS | Status: AC
  Administered 2015-08-11 (×2): via INTRAVENOUS

## 2015-08-11 MED ORDER — MEPERIDINE HCL 25 MG/ML IJ SOLN: 6.2500 mg | INTRAMUSCULAR | Status: DC | PRN

## 2015-08-11 MED ORDER — WITCH HAZEL-GLYCERIN EX PADS: 1.0000 "application " | MEDICATED_PAD | CUTANEOUS | Status: DC | PRN

## 2015-08-11 MED ORDER — FENTANYL CITRATE (PF) 100 MCG/2ML IJ SOLN
INTRAMUSCULAR | Status: AC
  Filled 2015-08-11: qty 2

## 2015-08-11 MED ORDER — PRENATAL MULTIVITAMIN CH
1.0000 | ORAL_TABLET | Freq: Every day | ORAL | Status: DC
  Administered 2015-08-12 – 2015-08-13 (×2): 1 via ORAL
  Filled 2015-08-11 (×2): qty 1

## 2015-08-11 MED ORDER — SCOPOLAMINE 1 MG/3DAYS TD PT72
MEDICATED_PATCH | TRANSDERMAL | Status: AC
  Administered 2015-08-11: 1.5 mg via TRANSDERMAL
  Filled 2015-08-11: qty 1

## 2015-08-11 MED ORDER — MORPHINE SULFATE (PF) 0.5 MG/ML IJ SOLN
INTRAMUSCULAR | Status: DC | PRN
  Administered 2015-08-11: .2 mg via INTRATHECAL

## 2015-08-11 MED ORDER — SENNOSIDES-DOCUSATE SODIUM 8.6-50 MG PO TABS
2.0000 | ORAL_TABLET | ORAL | Status: DC
  Administered 2015-08-11 – 2015-08-12 (×2): 2 via ORAL
  Filled 2015-08-11 (×2): qty 2

## 2015-08-11 MED ORDER — OXYTOCIN 40 UNITS IN LACTATED RINGERS INFUSION - SIMPLE MED: 62.5000 mL/h | INTRAVENOUS | Status: AC

## 2015-08-11 MED ORDER — KETOROLAC TROMETHAMINE 30 MG/ML IJ SOLN: 30.0000 mg | Freq: Four times a day (QID) | INTRAMUSCULAR | Status: AC | PRN

## 2015-08-11 MED ORDER — OXYTOCIN 10 UNIT/ML IJ SOLN
INTRAMUSCULAR | Status: AC
  Filled 2015-08-11: qty 4

## 2015-08-11 MED ORDER — DIPHENHYDRAMINE HCL 50 MG/ML IJ SOLN
INTRAMUSCULAR | Status: AC
  Filled 2015-08-11: qty 1

## 2015-08-11 MED ORDER — DIPHENHYDRAMINE HCL 50 MG/ML IJ SOLN: 12.5000 mg | INTRAMUSCULAR | Status: DC | PRN

## 2015-08-11 MED ORDER — SCOPOLAMINE 1 MG/3DAYS TD PT72
1.0000 | MEDICATED_PATCH | Freq: Once | TRANSDERMAL | Status: DC
  Administered 2015-08-11: 1.5 mg via TRANSDERMAL

## 2015-08-11 MED ORDER — ONDANSETRON HCL 4 MG/2ML IJ SOLN: 4.0000 mg | Freq: Once | INTRAMUSCULAR | Status: DC | PRN

## 2015-08-11 MED ORDER — KETOROLAC TROMETHAMINE 30 MG/ML IJ SOLN
30.0000 mg | Freq: Four times a day (QID) | INTRAMUSCULAR | Status: AC | PRN
  Administered 2015-08-11: 30 mg via INTRAVENOUS
  Filled 2015-08-11: qty 1

## 2015-08-11 MED ORDER — DIPHENHYDRAMINE HCL 25 MG PO CAPS: 25.0000 mg | ORAL_CAPSULE | ORAL | Status: DC | PRN

## 2015-08-11 MED ORDER — BUPIVACAINE IN DEXTROSE 0.75-8.25 % IT SOLN
INTRATHECAL | Status: DC | PRN
  Administered 2015-08-11: 1.6 mL via INTRATHECAL

## 2015-08-11 MED ORDER — LACTATED RINGERS IV SOLN
125.0000 mL/h | INTRAVENOUS | Status: DC
  Administered 2015-08-11: 125 mL/h via INTRAVENOUS

## 2015-08-11 MED ORDER — SIMETHICONE 80 MG PO CHEW
80.0000 mg | CHEWABLE_TABLET | Freq: Three times a day (TID) | ORAL | Status: DC
  Administered 2015-08-11 – 2015-08-13 (×5): 80 mg via ORAL
  Filled 2015-08-11 (×5): qty 1

## 2015-08-11 SURGICAL SUPPLY — 39 items
BENZOIN TINCTURE PRP APPL 2/3 (GAUZE/BANDAGES/DRESSINGS) ×3 IMPLANT
CATH ROBINSON RED A/P 16FR (CATHETERS) IMPLANT
CLAMP CORD UMBIL (MISCELLANEOUS) IMPLANT
CLOSURE STERI STRIP 1/2 X4 (GAUZE/BANDAGES/DRESSINGS) ×3 IMPLANT
CLOTH BEACON ORANGE TIMEOUT ST (SAFETY) ×3 IMPLANT
DRAPE SHEET LG 3/4 BI-LAMINATE (DRAPES) IMPLANT
DRSG OPSITE POSTOP 4X10 (GAUZE/BANDAGES/DRESSINGS) ×3 IMPLANT
DURAPREP 26ML APPLICATOR (WOUND CARE) ×3 IMPLANT
ELECT REM PT RETURN 9FT ADLT (ELECTROSURGICAL) ×3
ELECTRODE REM PT RTRN 9FT ADLT (ELECTROSURGICAL) ×1 IMPLANT
EXTRACTOR VACUUM M CUP 4 TUBE (SUCTIONS) IMPLANT
EXTRACTOR VACUUM M CUP 4' TUBE (SUCTIONS)
GLOVE BIOGEL PI IND STRL 7.0 (GLOVE) ×1 IMPLANT
GLOVE BIOGEL PI IND STRL 7.5 (GLOVE) ×2 IMPLANT
GLOVE BIOGEL PI INDICATOR 7.0 (GLOVE) ×2
GLOVE BIOGEL PI INDICATOR 7.5 (GLOVE) ×4
GLOVE ECLIPSE 7.5 STRL STRAW (GLOVE) ×3 IMPLANT
GOWN STRL REUS W/TWL LRG LVL3 (GOWN DISPOSABLE) ×9 IMPLANT
KIT ABG SYR 3ML LUER SLIP (SYRINGE) IMPLANT
NEEDLE HYPO 25X5/8 SAFETYGLIDE (NEEDLE) IMPLANT
NS IRRIG 1000ML POUR BTL (IV SOLUTION) ×3 IMPLANT
PACK C SECTION WH (CUSTOM PROCEDURE TRAY) ×3 IMPLANT
PAD OB MATERNITY 4.3X12.25 (PERSONAL CARE ITEMS) ×3 IMPLANT
PENCIL SMOKE EVAC W/HOLSTER (ELECTROSURGICAL) ×3 IMPLANT
RTRCTR C-SECT PINK 25CM LRG (MISCELLANEOUS) ×3 IMPLANT
SPONGE LAP 18X18 X RAY DECT (DISPOSABLE) ×3 IMPLANT
STRIP CLOSURE SKIN 1/2X4 (GAUZE/BANDAGES/DRESSINGS) ×2 IMPLANT
SUT MNCRL 0 VIOLET CTX 36 (SUTURE) IMPLANT
SUT MON AB-0 CT1 36 (SUTURE) ×9 IMPLANT
SUT MONOCRYL 0 CTX 36 (SUTURE)
SUT PLAIN 2 0 (SUTURE) ×2
SUT PLAIN ABS 2-0 CT1 27XMFL (SUTURE) ×1 IMPLANT
SUT VIC AB 0 CTX 36 (SUTURE) ×6
SUT VIC AB 0 CTX36XBRD ANBCTRL (SUTURE) ×3 IMPLANT
SUT VIC AB 2-0 CT1 27 (SUTURE) ×2
SUT VIC AB 2-0 CT1 TAPERPNT 27 (SUTURE) ×1 IMPLANT
SUT VIC AB 4-0 KS 27 (SUTURE) ×3 IMPLANT
TOWEL OR 17X24 6PK STRL BLUE (TOWEL DISPOSABLE) ×3 IMPLANT
TRAY FOLEY CATH SILVER 14FR (SET/KITS/TRAYS/PACK) ×3 IMPLANT

## 2015-08-11 NOTE — Anesthesia Postprocedure Evaluation (Signed)
  Anesthesia Post-op Note  Patient: [name]  Procedure(s) Performed: Procedure(s) (LRB): REPEAT CESAREAN SECTION (N/A)  Patient Location: PACU  Anesthesia Type: Spinal  Level of Consciousness: awake and alert   Airway and Oxygen Therapy: Patient Spontanous Breathing  Post-op Pain: mild  Post-op Assessment: Post-op Vital signs reviewed, Patient's Cardiovascular Status Stable, Respiratory Function Stable, Patent Airway and No signs of Nausea or vomiting  Last Vitals:  Filed Vitals:   08/11/15 1430  BP: 123/79  Pulse: 82  Temp:   Resp: 23    Post-op Vital Signs: stable   Complications: No apparent anesthesia complications

## 2015-08-11 NOTE — Anesthesia Preprocedure Evaluation (Signed)
Anesthesia Evaluation  Patient identified by MRN, date of birth, ID band Patient awake    Reviewed: Allergy & Precautions, NPO status , Patient's Chart, lab work & pertinent test results  History of Anesthesia Complications Negative for: history of anesthetic complications  Airway Mallampati: III  TM Distance: >3 FB Neck ROM: Full    Dental no notable dental hx. (+) Dental Advisory Given, Chipped,    Pulmonary neg pulmonary ROS,    Pulmonary exam normal breath sounds clear to auscultation       Cardiovascular negative cardio ROS Normal cardiovascular exam Rhythm:Regular Rate:Normal     Neuro/Psych PSYCHIATRIC DISORDERS Anxiety Depression negative neurological ROS     GI/Hepatic negative GI ROS, Neg liver ROS,   Endo/Other  obesity  Renal/GU negative Renal ROS  negative genitourinary   Musculoskeletal negative musculoskeletal ROS (+)   Abdominal   Peds negative pediatric ROS (+)  Hematology  (+) [place] conversation with patient about what she will and will not accept. She will accept albumin. She refuses all other blood products. She states that she understands her risk of bleeding and death and in the situation where blood is needed or she will die, she wants to die instead of receive blood. She was asking about cell saver. I told her we did not do that here but she has a right to go to another hospital that offers that. I informed her OB that she had questions. He will go discuss with her.    Anesthesia Other Findings   Reproductive/Obstetrics (+) Pregnancy Prior C/S x 2                             Anesthesia Physical Anesthesia Plan  ASA: III  Anesthesia Plan: Spinal   Post-op Pain Management:    Induction:   Airway Management Planned:   Additional Equipment:   Intra-op Plan:   Post-operative Plan:   Informed Consent: I have reviewed the patients History and  Physical, chart, labs and discussed the procedure including the risks, benefits and alternatives for the proposed anesthesia with the patient or authorized representative who has indicated his/her understanding and acceptance.   Dental advisory given  Plan Discussed with:   Anesthesia Plan Comments:         Anesthesia Quick Evaluation

## 2015-08-11 NOTE — H&P (Signed)
Obstetric Preoperative History and Physical  Amy Kim is a 29 y.o. [id number] with IUP at [redacted]w[redacted]d presenting for presenting for scheduled cesarean section.  No acute concerns.   Clinic  [facility] Prenatal Labs  Dating LMP/8 week [place] Blood type: B/Positive/-- (04/22 0000)   Genetic Screen 1 Screen: Unable to obtain   AFP:     Quad: Declined    NIPS: Antibody:Negative (04/22 0000)  Anatomic [place] Female, pyelectasis , anatomy completed 9/2 Rubella: Immune (04/22 0000)  GTT Early:173 3 hour GTT- WNL.  82/172/133/98 Third trimester: 111 RPR: NON REAC (08/23 1315)   Flu vaccine  8/23 HBsAg: Negative (04/22 0000)   TDaP vaccine  8/23                                           Rhogam: HIV: NONREACTIVE (08/23 1315)   GBS                                              (For PCN allergy, check sensitivities) GBS: NEGATIVE  Baby Food  Breast Pap: 2016 Nornal  Contraception  Depo   Circumcision  girl   Pediatrician  [facility].    Support Person  Spouse-David    Prenatal Course Source of Care: [facility]  with onset of care at 17 weeks Pregnancy complications or risks: Patient Active Problem List   Diagnosis Date Noted  . History of gestational hypertension 04/19/2015  . Abdominal cyst 03/29/2015  . Obesity in pregnancy, antepartum   . History of cesarean delivery, currently pregnant 03/24/2015  . Supervision of normal pregnancy in second trimester 03/22/2015   She plans to breastfeed She desires Depo-Provera for postpartum contraception.   Prenatal labs and studies: ABO, Rh: --/--/B POS, B POS (11/16 0955) Antibody: NEG (11/16 0955) Rubella: Immune (04/22 0000) RPR: Non Reactive (11/16 0955)  HBsAg: Negative (04/22 0000)  HIV: NONREACTIVE (08/23 1315)  [id number] (10/19 0000) 1 hr Glucola  Early GTT- 173, 3 hr wnl, Third trimester wnl Genetic screening declined Anatomy US abnormal with Fetal pyelectasis  Prenatal Transfer Tool  Maternal Diabetes: No Genetic Screening:  Declined Maternal Ultrasounds/Referrals: Abnormal:  Findings:   Other: Fetal pyelectasis Fetal Ultrasounds or other Referrals:  None Maternal Substance Abuse:  No Significant Maternal Medications:  None Significant Maternal Lab Results: Lab values include: Other: No GBS done given planned rLTCS  Past Medical History  Diagnosis Date  . Urinary tract infection   . Anxiety   . Depression     No meds. since + UPT    Past Surgical History  Procedure Laterality Date  . Cesarean section      x 2    OB History  Gravida Para Term Preterm AB SAB TAB Ectopic Multiple Living  4 2 2  0 1 1 0 0 0 2    # Outcome Date GA Lbr Len/2nd Weight Sex Delivery Anes PTL Lv  4 Current           3 Term 05/07/13 [redacted]w[redacted]d  6 lb 14 oz (3.118 kg) M CS-Unspec   Y     Comments: no complications, born [place]  2 Term 08/2010 [redacted]w[redacted]d  6 lb 11 oz (3.033 kg) M CS-Unspec   Y     Comments: emergency c/s  due to maternal blood pressure and fhr, born [place]  1 SAB 09/2009 [redacted]w[redacted]d             Social History   Social History  . Marital Status: Married    Spouse Name: N/A  . Number of Children: N/A  . Years of Education: N/A   Social History Main Topics  . Smoking status: Never Smoker   . Smokeless tobacco: Never Used  . Alcohol Use: Yes     Comment: Occas. when not pregnant  . Drug Use: No  . Sexual Activity: Yes    Birth Control/ Protection: None   Other Topics Concern  . None   Social History Narrative    Family History  Problem Relation Age of Onset  . Hypertension Mother   . Hypertension Father   . Heart disease Paternal Grandmother   .       Prescriptions prior to admission  Medication Sig Dispense Refill Last Dose  . Prenatal Vit-Fe Fumarate-FA (PRENATAL MULTIVITAMIN) TABS tablet Take 1 tablet by mouth daily at 12 noon.   08/10/2015 at Unknown time    No Known Allergies  Review of Systems: Negative except for what is mentioned in HPI.  Physical Exam: BP 119/68 mmHg  Pulse 98   Temp(Src) 98 F (36.7 C) (Oral)  SpO2 99%  LMP 11/10/2014 FHR by Doppler: 142 bpm CONSTITUTIONAL: Well-developed, well-nourished female in no acute distress.  HENT:  Normocephalic, atraumatic, External right and left ear normal. Oropharynx is clear and moist EYES: Conjunctivae and EOM are normal. No scleral icterus.  NECK: Normal range of motion, supple, no masses SKIN: Skin is warm and dry. No rash noted. Not diaphoretic. No erythema. No pallor. [place]: Alert and oriented to person, place, and time. Normal reflexes, muscle tone coordination. No cranial nerve deficit noted. PSYCHIATRIC: Normal mood and affect. Normal behavior. Normal judgment and thought content. CARDIOVASCULAR: Normal heart rate noted, regular rhythm RESPIRATORY: Effort and breath sounds normal, no problems with respiration noted ABDOMEN: Soft, nontender, nondistended, gravid. Well-healed Pfannenstiel incision. PELVIC: Deferred MUSCULOSKELETAL: Normal range of motion. No edema and no tenderness. 2+ distal pulses.   Pertinent Labs/Studies:   Results for orders placed or performed in visit on 08/10/15 (from the past 72 hour(s))  ABO/Rh     Status: None   Collection Time: 08/10/15  9:55 AM  Result Value Ref Range   ABO/RH(D) B POS   POCT urinalysis dip (device)     Status: Abnormal   Collection Time: 08/10/15 10:39 AM  Result Value Ref Range   Glucose, UA NEGATIVE NEGATIVE mg/dL   Bilirubin Urine SMALL (A) NEGATIVE   Ketones, ur TRACE (A) NEGATIVE mg/dL   Specific Gravity, Urine 1.020 1.005 - 1.030   Hgb urine dipstick NEGATIVE NEGATIVE   pH 7.0 5.0 - 8.0   Protein, ur 30 (A) NEGATIVE mg/dL   Urobilinogen, UA 1.0 0.0 - 1.0 mg/dL   Nitrite NEGATIVE NEGATIVE   Leukocytes, UA NEGATIVE NEGATIVE    Comment: Biochemical Testing Only. Please order routine urinalysis from main lab if confirmatory testing is needed.    Assessment and Plan :Amy Kim is a 29 y.o. [id number] at [redacted]w[redacted]d being admitted being admitted  for scheduled cesarean section. The risks of cesarean section discussed with the patient included but were not limited to: bleeding which may require transfusion or reoperation; infection which may require antibiotics; injury to bowel, bladder, ureters or other surrounding organs; injury to the fetus; need for additional procedures including hysterectomy in the  event of a life-threatening hemorrhage; placental abnormalities wth subsequent pregnancies, incisional problems, thromboembolic phenomenon and other postoperative/anesthesia complications. The patient concurred with the proposed plan, giving informed written consent for the procedure. Patient has been NPO since last night she will remain NPO for procedure. Anesthesia and OR aware. Preoperative prophylactic antibiotics and SCDs ordered on call to the OR. To OR when ready.   Caren Macadam, MD  OB Fellow Faculty Practice, [facility]

## 2015-08-11 NOTE — Anesthesia Procedure Notes (Signed)
Spinal Patient location during procedure: OR Staffing Anesthesiologist: Angla Delahunt Performed by: anesthesiologist  Preanesthetic Checklist Completed: patient identified, site marked, surgical consent, pre-op evaluation, timeout performed, IV checked, risks and benefits discussed and monitors and equipment checked Spinal Block Patient position: sitting Prep: DuraPrep Patient monitoring: continuous pulse ox, blood pressure and heart rate Approach: midline Location: L3-4 Injection technique: single-shot Needle Needle type: Sprotte  Needle gauge: 24 G Needle length: 9 cm Additional Notes Functioning IV was confirmed and monitors were applied. Sterile prep and drape, including hand hygiene, mask and sterile gloves were used. The patient was positioned and the spine was prepped. The skin was anesthetized with lidocaine.  Free flow of clear CSF was obtained prior to injecting local anesthetic into the CSF.  The spinal needle aspirated freely following injection.  The needle was carefully withdrawn.  The patient tolerated the procedure well. Consent was obtained prior to procedure with all questions answered and concerns addressed. Risks including but not limited to bleeding, infection, nerve damage, paralysis, failed block, inadequate analgesia, allergic reaction, high spinal, itching and headache were discussed and the patient wished to proceed.   Faria Casella, MD     

## 2015-08-11 NOTE — Brief Op Note (Signed)
08/11/2015  2:36 PM  PATIENT:  Amy Kim  29 y.o. female  PRE-OPERATIVE DIAGNOSIS:   REPEAT c/s  POST-OPERATIVE DIAGNOSIS:  repeat c/s  PROCEDURE:  Procedure(s): REPEAT CESAREAN SECTION (N/A)  SURGEON:  Surgeon(s) and Role:    * Truett Mainland, DO - Primary    * Caren Macadam, MD - Assisting  PHYSICIAN ASSISTANT:   ASSISTANTS: none   ANESTHESIA:   spinal  EBL:  Total I/O In: 46 [I.V.:2600] Out: 850 [Urine:200; Blood:650]  BLOOD ADMINISTERED:none  DRAINS: none   LOCAL MEDICATIONS USED:  NONE  SPECIMEN:  Source of Specimen:  Placenta  DISPOSITION OF SPECIMEN:  labor and delivery  COUNTS:  YES  TOURNIQUET:  * No tourniquets in log *  DICTATION: .Note written in EPIC  PLAN OF CARE: Admit to inpatient   PATIENT DISPOSITION:  PACU - hemodynamically stable.   Delay start of Pharmacological VTE agent (>24hrs) due to surgical blood loss or risk of bleeding: yes

## 2015-08-11 NOTE — Transfer of Care (Signed)
Immediate Anesthesia Transfer of Care Note  Patient: [name]  Procedure(s) Performed: Procedure(s): REPEAT CESAREAN SECTION (N/A)  Patient Location: PACU  Anesthesia Type:Spinal  Level of Consciousness: awake, alert  and oriented  Airway & Oxygen Therapy: Patient Spontanous Breathing  Post-op Assessment: Report given to RN and Post -op Vital signs reviewed and stable  Post vital signs: Reviewed and stable  Last Vitals:  Filed Vitals:   08/11/15 0819  BP: 119/68  Pulse: 98  Temp: 123XX123 C    Complications: No apparent anesthesia complications

## 2015-08-11 NOTE — Op Note (Signed)
Amy Kim PROCEDURE DATE: 08/11/2015  PROCEDURE:  Procedure(s): REPEAT CESAREAN SECTION (N/A)  SURGEON:  Surgeon(s) and Role:    * Truett Mainland, DO - Primary    * Caren Macadam, MD - Assisting  ASSISTANTS: none   ANESTHESIA:   spinal  EBL:  Total I/O In: 2600 [I.V.:2600] Out: 850 [Urine:200; Blood:650]  INDICATIONS: Amy Kim is a 29 y.o. [id number] at [redacted]w[redacted]d here for cesarean section secondary to the indications listed under preoperative diagnoses; please see preoperative note for further details.  The risks of cesarean section were discussed with the patient including but were not limited to: bleeding which may require transfusion or reoperation; infection which may require antibiotics; injury to bowel, bladder, ureters or other surrounding organs; injury to the fetus; need for additional procedures including hysterectomy in the event of a life-threatening hemorrhage; placental abnormalities wth subsequent pregnancies, incisional problems, thromboembolic phenomenon and other postoperative/anesthesia complications.   The patient concurred with the proposed plan, giving informed written consent for the procedure.    FINDINGS:  Viable female infant in cephalic presentation.  Apgars 9 and 9.  Clear amniotic fluid.  Intact placenta, three vessel cord.  Dense adhesions of muscle to fascia. 4cm uterine window in the lower uterine segment, to the left of midline extending laterally to broad ligament. Normal uterus, fallopian tubes and ovaries bilaterally.  PROCEDURE IN DETAIL:  The patient preoperatively received intravenous antibiotics and had sequential compression devices applied to her lower extremities.  She was then taken to the operating room where spinal anesthesia was administered and was found to be adequate. She was then placed in a dorsal supine position with a leftward tilt, and prepped and draped in a sterile manner.  A foley catheter was placed into her bladder and  attached to constant gravity.  After an adequate timeout was performed, a Pfannenstiel skin incision was made with scalpel and carried through to the underlying layer of fascia. The fascia was incised in the midline, and this incision was extended bilaterally using the Mayo scissors.  Kocher clamps were applied to the superior aspect of the fascial incision and the underlying rectus muscles were dissected off bluntly and with bovie. We encountered dense adhesion of the rectus muscle to the fascia and these were removed using a combination of blunt dissection and cauterization. A similar process was carried out on the inferior aspect of the fascial incision. The rectus muscles were separated in the midline sharply given scare tissue and the peritoneum was entered sharply with a combination of hemostats and Metzenbaum scissors. Attention was turned to the lower uterine segment where we noted a 4 cm uterine window in the lower uterine segment to the left of midline. in the midline but incorporating the uterine window a low transverse hysterotomy was made with a scalpel and extended bilaterally bluntly with careful attention to avoid left lateral edge given risk of extension into uterine artery.  The infant was successfully delivered, the cord was clamped and cut and the infant was handed over to awaiting neonatology team. Uterine massage was then administered, and the placenta delivered intact with a three-vessel cord. The uterus was then cleared of clot and debris.  The very thin lower uterine segment was noted with a vertical, midline extension towards bladder but not involving the bladder. Extension was approximately 1 cm in length. This extension was repaired with 0-monocryl. The remaining transverse hysterotomy was closed with 0 Monocryl in a running locked fashion, and an imbricating layer was also placed  with 0 Monocryl.  The pelvis was cleared of all clot and debris. Hemostasis was confirmed on all surfaces.   The peritoneum and the muscles were reapproximated using 0 Vicryl interrupted stitches. The fascia was then closed using 0 Vicryl in a running fashion.  The subcutaneous layer was irrigated, then reapproximated with 2-0 plain gut interrupted stitche.  The skin was closed with a 4-0 Vicryl subcuticular stitch. The patient tolerated the procedure well. Sponge, lap, instrument and needle counts were correct x 2.  She was taken to the recovery room in stable condition.   Caren Macadam, MD Family Medicine, OB Fellow [facility]

## 2015-08-11 NOTE — Lactation Note (Signed)
This note was copied from the chart of Amy Kim. Lactation Consultation Note  Patient Name: Amy Kim Today's Date: 08/11/2015 Reason for consult: Initial assessment Baby at 3 of life and mom reports bf is going well. She pumped for her oldest for 6 months, he was in the NICU and had heart problems. She bf her 2nd child for 1 yr with no issues. Discussed getting a DEBP from insurance. Reviewed feeding frequency, voids, wt loss, baby behavior, manual expression, breast changes, and nipple care. Given lactation handouts. She is aware of OP services and support group. She will call as needed for bf help.   Maternal Data Formula Feeding for Exclusion: No Has patient been taught Hand Expression?: Yes Does the patient have breastfeeding experience prior to this delivery?: Yes  Feeding Feeding Type: Breast Fed Length of feed: 60 min (off and on)  LATCH Score/Interventions Latch: Repeated attempts needed to sustain latch, nipple held in mouth throughout feeding, stimulation needed to elicit sucking reflex. Intervention(s): Assist with latch;Breast compression;Adjust position  Audible Swallowing: A few with stimulation Intervention(s): Skin to skin;Hand expression  Type of Nipple: Everted at rest and after stimulation  Comfort (Breast/Nipple): Soft / non-tender     Hold (Positioning): Assistance needed to correctly position infant at breast and maintain latch. Intervention(s): Breastfeeding basics reviewed;Support Pillows;Position options;Skin to skin  LATCH Score: 7  Lactation Tools Discussed/Used WIC Program: No   Consult Status Consult Status: PRN    Denzil Hughes 08/11/2015, 6:55 PM

## 2015-08-12 ENCOUNTER — Encounter (HOSPITAL_COMMUNITY): Payer: Self-pay | Admitting: Family Medicine

## 2015-08-12 LAB — CBC
HEMATOCRIT: 30.7 % — AB (ref 36.0–46.0)
Hemoglobin: 10 g/dL — ABNORMAL LOW (ref 12.0–15.0)
MCH: 26.1 pg (ref 26.0–34.0)
MCHC: 32.6 g/dL (ref 30.0–36.0)
MCV: 80.2 fL (ref 78.0–100.0)
PLATELETS: 329 10*3/uL (ref 150–400)
RBC: 3.83 MIL/uL — ABNORMAL LOW (ref 3.87–5.11)
RDW: 15 % (ref 11.5–15.5)
WBC: 9.3 10*3/uL (ref 4.0–10.5)

## 2015-08-12 LAB — BIRTH TISSUE RECOVERY COLLECTION (PLACENTA DONATION)

## 2015-08-12 NOTE — Clinical Social Work Maternal (Signed)
CLINICAL SOCIAL WORK MATERNAL/CHILD NOTE  Patient Details  Name: Amy Kim MRN: [id number] Date of Birth: 07-24-1986  Date:  08/12/2015  Clinical Social Worker Initiating Note:  Lucita Ferrara, MSW, LCSW Date/ Time Initiated:  08/12/15/1030     Child's Name:  Silverio Lay   Legal Guardian:  Nat Math and Jeanella Cara   Need for Interpreter:  None   Date of Referral:  08/11/15     Reason for Referral:  History of depression and anxiety  Referral Source:  [facility]   Address:  [place], [place] [address]  Phone number:  [phone]   Household Members:  Minor Children, Spouse   Natural Supports (not living in the home):  Extended Family, Immediate Family   Professional Supports: None   Employment:   Unemployed  Type of Work:   N/A  Education:    N/A  Pensions consultant:  Medicaid   Other Resources:  [employer]   Cultural/Religious Considerations Which May Impact Care:  None reported  Strengths:  Ability to meet basic needs , Engineer, materials , Home prepared for child    Risk Factors/Current Problems:   1)Mental Health Concerns : MOB presents with history of depression and anxiety, and was participating in therapy and medication management prior to the pregnancy.  Pregnancy and mental health complicated by death of MOB's sister and her stepfather experiencing acute health concerns.  MOB recently relocated to [facility], and has not yet identified local mental health providers.   Cognitive State:  Alert , Insightful , Goal Oriented , Linear Thinking    Mood/Affect:  Comfortable , Happy , Calm , Interested    CSW Assessment:  CSW received request for consult due to MOB presenting with a history of depression and anxiety. Per chart review, MOB was prescribed medications for symptoms until she became pregnant.   MOB presented in a pleasant mood, and displayed an appropriate range in affect during the assessment. She was providing skin to  skin to the infant, and was noted to be smiling as she interacted with the infant.  MOB stated that she waiting for the FOB to return to her room with their other children (ages 99 and 2), and shared that it would be the first time her other children met the infant. MOB reported that she is looking forward to seeing their interactions together as a family.  MOB stated that she feels well supported by her family, and denied questions or concerns as she prepares to discharge home.  She discussed that she has had 2 previous C-sections prior to this, and reported that she knows what to anticipate and expect about the recovery and transition postpartum. She also reported that breastfeeding is going well, and shared that she is "happy" about how well feedings are going. MOB confirmed that the home is prepared for the infant.   MOB reported history of depression and anxiety since "forever". She shared that she has a prior history of participating in therapy and medication management. Per MOB, she is from [place], and moved to [place] in June. She stated that prior to her move, she was participating in therapy. She reported that she was not followed by a psychiatrist, but reported that her primary care doctor prescribed Prozac. MOB stated that she discontinued the Prozac with the +UPT.  MOB shared that it was difficult to assess her mental health during the pregnancy due to her sister dying during the pregnancy and due to her stepfather suffering from a serious brain injury.  She reflected upon the grief and loss that she experienced, and reported that it was difficult to determine which was grief and which was depression and anxiety.  CSW provided supportive listening, and provided MOB opportunity to express her feelings.  MOB did not discuss her feelings in great detail, but stated that she is doing "better". She shared that her stepfather is at home and recovering well, and she decided to name the infant after her  sister.   Per MOB, she feels that she can talk to her family and FOB about her thoughts and feelings. MOB denied prior history of postpartum depression, but acknowledged increased risk based on prior mental health history and increase stress during the pregnancy.  CSW provided education on perinatal mood disorders, and MOB presented as attentive and engaged.  CSW offered to provide MOB with referral information about therapists in the area.  MOB expressed appreciation in the event that needs arise.   MOB expressed appreciation for the visit, acknowledged ongoing CSW availability, and agreed to contact CSW if needs arise during the admission.   CSW Plan/Description:   1)Patient/Family Education: Perinatal mood disorders 2)Information/Referral to [employer]: Outpatient therapists, Feelings After Birth Support group 3)No Further Intervention Required/No Barriers to Discharge    Sharyl Nimrod 08/12/2015, 12:49 PM

## 2015-08-12 NOTE — Progress Notes (Signed)
UR chart review completed.  

## 2015-08-12 NOTE — Addendum Note (Signed)
Addendum  created 08/12/15 0800 by Garner Nash, CRNA   Modules edited: Notes Section   Notes Section:  File: [id number]

## 2015-08-12 NOTE — Progress Notes (Signed)
Post Partum Day 1 Subjective: Amy Kim is a 29 y.o. [id number] [redacted]w[redacted]d s/p rLTCS.  No acute events overnight.  Pt denies problems with ambulating, voiding or po intake.  She denies nausea, vomiting, headaches, blurry vision, chest pain, trouble breathing, midepigastric pain, or dizziness with ambulation.  Pain is well controlled.  She has had flatus.  Lochia is moderate.  Plan for birth control is Depo-Provera.  Method of feeding is breast.  Objective: Blood pressure 100/56, pulse 70, temperature 98.4 F (36.9 C), temperature source Oral, resp. rate 20, last menstrual period 11/10/2014, SpO2 100 %, unknown if currently breastfeeding.  Physical Exam:  General: alert, cooperative and no distress Lochia: normal flow Chest: normal WOB, clear to auscultation bilaterally Heart: regular rate and rhythm, S1S2 heard Abdomen: +BS, appropriately tender Uterine Fundus: firm, located 1cm above level of umbilicus Extremities: no pitting edema   Recent Labs  08/10/15 0955 08/12/15 0605  HGB 11.2* 10.0*  HCT 34.3* 30.7*    Assessment/Plan:  ASSESSMENT: Amy Kim is a 29 y.o. [id number] [redacted]w[redacted]d s/p rLTCS  POD#1 H/H stable, continue to monitor for s/s of anemia or postpartum hemorrhage Continue routine PP care Breastfeeding is going well, lactation support PRN Discharge on POD#3   LOS: 1 day   Laurene Footman, MS3 08/12/2015, 7:34 AM   OB fellow attestation Post Partum Day 1/ POD#1 I have seen and examined this patient and agree with above documentation in the resident's note.   Amy Kim is a 29 y.o. [id number] s/p rLTCS.  Pt denies problems with ambulating, voiding or po intake. Pain is well controlled.  Plan for birth control is Depo-Provera.  Method of Feeding: Breast  PE:  BP 101/55 mmHg  Pulse 81  Temp(Src) 98.1 F (36.7 C) (Oral)  Resp 18  SpO2 100%  LMP 11/10/2014  Breastfeeding? Unknown Gen: well appearing Heart: reg rate Lungs: normal WOB Fundus firm Ext:  soft, no pain, no edema  Plan for discharge:  POD#2 or #3 -continue current plan -pain meds prn -lactation support prn  Caren Macadam, MD 10:07 AM

## 2015-08-12 NOTE — Lactation Note (Signed)
This note was copied from the chart of Amy Brystal Halvorsen. Lactation Consultation Note  Patient Name: Amy Kim [id number] Date: 08/12/2015 Reason for consult: Follow-up assessment;Infant < 6lbs   Follow up with mom of 28 year old infant. Infant born at 7 weeks. Infant weight today 5 lb 13.1 oz, weight loss 2%. Infant with 10+ BF for 30-60 minutes, 4 voids and no stool in last 24 hours. No stool since birth, parents say she has been passing gas today. Mom was burping infant when I went into room, she latched infant on to left breast independently. Mom with large pendulous breasts with compressible areola. Infant latched easily with rhythmic sucking and intermittent swallows. Mom was indenting breasts giving air space. Assisted mom with positioning by leaning mom back in bed and using pillows for support. Was able to get infant positioned on pillows without having to give extra breathing space. Mom denies nipple pain. Encouraged mom to massage/compress breasts with feeding to maximize milk transfer. Encouraged parents to stimulate infants rectum with next diaper change with warm wash cloth. Reviewed BF information in Taking Care of Baby and Me Booklet. Reviewed Engorgement prevention and gave mom manual pump for prn home use. Enc mom to call as needed for assistance.   Maternal Data Formula Feeding for Exclusion: No Has patient been taught Hand Expression?: Yes Does the patient have breastfeeding experience prior to this delivery?: Yes  Feeding Feeding Type: Breast Fed Length of feed: 20 min  LATCH Score/Interventions Latch: Grasps breast easily, tongue down, lips flanged, rhythmical sucking. Intervention(s): Breast compression;Breast massage  Audible Swallowing: Spontaneous and intermittent  Type of Nipple: Everted at rest and after stimulation  Comfort (Breast/Nipple): Soft / non-tender     Hold (Positioning): No assistance needed to correctly position infant at  breast. Intervention(s): Breastfeeding basics reviewed;Support Pillows;Position options;Skin to skin  LATCH Score: 10  Lactation Tools Discussed/Used WIC Program: No   Consult Status Consult Status: Follow-up Date: 08/13/15 Follow-up type: In-patient    Debby Freiberg Aleksei Goodlin 08/12/2015, 4:11 PM

## 2015-08-12 NOTE — Anesthesia Postprocedure Evaluation (Signed)
  Anesthesia Post-op Note  Patient: Amy Kim  Procedure(s) Performed: Procedure(s): REPEAT CESAREAN SECTION (N/A)  Patient Location: Mother/Baby  Anesthesia Type:Spinal  Level of Consciousness: awake and alert   Airway and Oxygen Therapy: Patient Spontanous Breathing  Post-op Pain: mild  Post-op Assessment: Post-op Vital signs reviewed, Patient's Cardiovascular Status Stable, Respiratory Function Stable, No signs of Nausea or vomiting, Pain level controlled, No headache, Spinal receding and Patient able to bend at knees              Post-op Vital Signs: Reviewed  Last Vitals:  Filed Vitals:   08/12/15 0506  BP: 100/56  Pulse: 70  Temp: 36.9 C  Resp: 20    Complications: No apparent anesthesia complications

## 2015-08-13 MED ORDER — IBUPROFEN 600 MG PO TABS: 600.0000 mg | ORAL_TABLET | Freq: Four times a day (QID) | ORAL | Status: DC

## 2015-08-13 NOTE — Discharge Instructions (Signed)

## 2015-08-13 NOTE — Discharge Summary (Signed)
OB Discharge Summary     Patient Name: Scottie Kriston DOB: [birth year] MRN: [id number]  Date of admission: 08/11/2015 Delivering MD: Lauretta Chester NILES   Date of discharge: 08/13/2015  Admitting diagnosis: cpt [phone] - REPEAT c-s Intrauterine pregnancy: [redacted]w[redacted]d     Secondary diagnosis:  Principal Problem:   S/P repeat low transverse C-section Active Problems:   Supervision of normal pregnancy in second trimester   History of cesarean delivery, currently pregnant   Obesity in pregnancy, antepartum   History of gestational hypertension  Additional problems: none     Discharge diagnosis: Term Pregnancy Delivered and Gestational Hypertension                                                                                                Post partum procedures:none  Augmentation: none.   Complications: None  Hospital course:  Sceduled C/S   29 y.o. yo [id number] at [redacted]w[redacted]d was admitted to the hospital 08/11/2015 for scheduled cesarean section with the following indication:Elective Repeat.  Membrane Rupture Time/Date: 12:37 PM ,08/11/2015   Patient delivered a Viable infant.08/11/2015  Details of operation can be found in separate operative note.  Pateint had an uncomplicated postpartum course.  She is ambulating, tolerating a regular diet, passing flatus, and urinating well. Patient is discharged home in stable condition on No discharge date for patient encounter.          Physical exam  Filed Vitals:   08/12/15 0506 08/12/15 0930 08/12/15 1830 08/13/15 0602  BP: 100/56 101/55 110/64 122/74  Pulse: 70 81 88 81  Temp: 98.4 F (36.9 C) 98.1 F (36.7 C) 98.2 F (36.8 C) 97.8 F (36.6 C)  TempSrc: Oral Oral Oral Oral  Resp: 20 18 20 18   SpO2: 100%      General: alert, cooperative and no distress Lochia: appropriate Uterine Fundus: firm Incision: Healing well with no significant drainage, No significant erythema, Dressing is clean, dry, and intact DVT Evaluation: No evidence  of DVT seen on physical exam. Labs: Lab Results  Component Value Date   WBC 9.3 08/12/2015   HGB 10.0* 08/12/2015   HCT 30.7* 08/12/2015   MCV 80.2 08/12/2015   PLT 329 08/12/2015   CMP Latest Ref Rng 03/29/2015  Total Protein 6.0 - 8.3 g/dL 6.5  Total Bilirubin 0.2 - 1.2 mg/dL 0.2  Alkaline Phos 39 - 117 U/L 71  AST 0 - 37 U/L 14  ALT 0 - 35 U/L 25    Discharge instruction: per After Visit Summary and "Baby and Me Booklet".  After visit meds:    Medication List    ASK your doctor about these medications        prenatal multivitamin Tabs tablet  Take 1 tablet by mouth daily at 12 noon.        Diet: routine diet  Activity: Advance as tolerated. Pelvic rest for 6 weeks.   Outpatient follow up:2 weeks - 4 weeks. Follow up Appt:Future Appointments Date Time Provider [place]  09/08/2015 12:45 PM Woodroe Mode, MD [facility]   Follow up Visit:No Follow-up on file.  Postpartum  contraception: desires depo shot.    Newborn Data: Live born female  Birth Weight: 5 lb 15.4 oz (2705 g) APGAR: 9, 9  Baby Feeding: Breast Disposition:home with mother   08/13/2015 Paula Compton, MD   CNM attestation I have seen and examined this patient and agree with above documentation in the resident's note.   Keyari Gilchrest is a 29 y.o. [id number] s/p rLTCS.   Pain is well controlled.  Plan for birth control is Depo-Provera.  Method of Feeding: breast  PE:  BP 122/74 mmHg  Pulse 81  Temp(Src) 97.8 F (36.6 C) (Oral)  Resp 18  SpO2 100%  LMP 11/10/2014  Breastfeeding? Unknown Fundus firm  No results for input(s): HGB, HCT in the last 72 hours.   Plan: discharge today - postpartum care discussed - f/u clinic in 6 weeks for postpartum visit   Davarius Ridener, CNM 11:05 PM

## 2015-09-08 ENCOUNTER — Encounter: Payer: Self-pay | Admitting: Family

## 2015-09-08 ENCOUNTER — Ambulatory Visit (INDEPENDENT_AMBULATORY_CARE_PROVIDER_SITE_OTHER): Payer: Medicaid Other | Admitting: Family

## 2015-09-08 ENCOUNTER — Other Ambulatory Visit (HOSPITAL_COMMUNITY)
Admission: RE | Admit: 2015-09-08 | Discharge: 2015-09-08 | Disposition: A | Discharge status: Home / Self Care | Payer: Medicaid Other | Source: Ambulatory Visit | Attending: Obstetrics & Gynecology | Admitting: Obstetrics & Gynecology

## 2015-09-08 VITALS — BP 134/87 | HR 68 | Temp 98.2°F | Ht 61.0 in | Wt 189.0 lb

## 2015-09-08 DIAGNOSIS — Z3492 Encounter for supervision of normal pregnancy, unspecified, second trimester: Secondary | ICD-10-CM

## 2015-09-08 DIAGNOSIS — Z01419 Encounter for gynecological examination (general) (routine) without abnormal findings: Secondary | ICD-10-CM | POA: Insufficient documentation

## 2015-09-08 DIAGNOSIS — Z3042 Encounter for surveillance of injectable contraceptive: Secondary | ICD-10-CM | POA: Diagnosis not present

## 2015-09-08 DIAGNOSIS — Z98891 History of uterine scar from previous surgery: Secondary | ICD-10-CM

## 2015-09-08 DIAGNOSIS — Z3202 Encounter for pregnancy test, result negative: Secondary | ICD-10-CM | POA: Diagnosis not present

## 2015-09-08 DIAGNOSIS — Z8759 Personal history of other complications of pregnancy, childbirth and the puerperium: Secondary | ICD-10-CM | POA: Diagnosis not present

## 2015-09-08 LAB — POCT PREGNANCY, URINE: Preg Test, Ur: NEGATIVE

## 2015-09-08 MED ORDER — MEDROXYPROGESTERONE ACETATE 150 MG/ML IM SUSP
150.0000 mg | INTRAMUSCULAR | Status: DC
  Administered 2015-09-08 – 2016-12-31 (×6): 150 mg via INTRAMUSCULAR

## 2015-09-08 NOTE — Progress Notes (Signed)
Patient ID: Amy Kim, female   DOB: 1985-12-20, 29 y.o.   MRN: [id number] Subjective:     Amy Kim is a 29 y.o. female who presents for a postpartum visit. She is 4 weeks postpartum following a low cervical transverse Cesarean section. I have fully reviewed the prenatal and intrapartum course. The delivery was at 39.1 gestational weeks. Outcome: repeat cesarean section, low transverse incision. Anesthesia: spinal. Postpartum course has been unremarkable. Baby's course has been unremarkable. Baby is feeding by breast. Bleeding no bleeding.  Bled intermittently x 2 weeks.   Bowel function is normal. Bladder function is normal. Patient is not sexually active. Contraception method is none. Interested in depo injections Postpartum depression screening: negative.  The following portions of the patient's history were reviewed and updated as appropriate: allergies, current medications, past family history, past medical history, past social history, past surgical history and problem list.  Review of Systems Pertinent items are noted in HPI.   Objective:    BP 134/87 mmHg  Pulse 68  Temp(Src) 98.2 F (36.8 C) (Oral)  Ht 5\' 1"  (1.549 m)  Wt 189 lb (85.73 kg)  BMI 35.73 kg/m2  Breastfeeding? Yes        General:  alert, cooperative and appears stated age   Breasts:  inspection negative, no nipple discharge or bleeding, no masses or nodularity palpable  Lungs: clear to auscultation bilaterally  Heart:  regular rate and rhythm, S1, S2 normal, no murmur, click, rub or gallop  Abdomen: soft, non-tender; bowel sounds normal; no masses,  no organomegaly   Vulva:  normal  Vagina: normal vagina, no discharge, exudate, lesion, or erythema; healed well  Cervix:  no cervical motion tenderness; no bleeding noted after pap  Corpus: normal size, contour, position, consistency, mobility, non-tender  Adnexa:  normal adnexa  Rectal Exam: Not performed.   Results for orders placed or performed in  visit on 09/08/15 (from the past 24 hour(s))  Pregnancy, urine POC     Status: None   Collection Time: 09/08/15  1:09 PM  Result Value Ref Range   Preg Test, Ur NEGATIVE NEGATIVE    Assessment:     Normal postpartum exam. Pap smear done at today's visit.   Plan:    1. Contraception: Depo-Provera injections 2.   Follow up in: 12 weeks or as needed.    Venia Carbon Michiel Cowboy, CNM

## 2015-09-09 LAB — CYTOLOGY - PAP

## 2015-11-24 ENCOUNTER — Ambulatory Visit (INDEPENDENT_AMBULATORY_CARE_PROVIDER_SITE_OTHER): Payer: Medicaid Other

## 2015-11-24 VITALS — BP 122/76 | HR 71 | Wt 189.9 lb

## 2015-11-24 DIAGNOSIS — Z3042 Encounter for surveillance of injectable contraceptive: Secondary | ICD-10-CM

## 2015-11-24 NOTE — Progress Notes (Signed)
Pt returned for her depo-provera. Pt tolerated this well and will returned in three months.

## 2016-02-09 ENCOUNTER — Ambulatory Visit: Payer: Medicaid Other

## 2016-02-10 ENCOUNTER — Ambulatory Visit (INDEPENDENT_AMBULATORY_CARE_PROVIDER_SITE_OTHER): Payer: Medicaid Other | Admitting: *Deleted

## 2016-02-10 VITALS — BP 122/87 | HR 90

## 2016-02-10 DIAGNOSIS — Z3042 Encounter for surveillance of injectable contraceptive: Secondary | ICD-10-CM

## 2016-02-14 ENCOUNTER — Telehealth: Payer: Self-pay | Admitting: *Deleted

## 2016-02-14 DIAGNOSIS — IMO0002 Reserved for concepts with insufficient information to code with codable children: Secondary | ICD-10-CM

## 2016-02-14 NOTE — Telephone Encounter (Signed)
-----   Message from Donnamae Jude, MD sent at 02/10/2016 10:24 AM EDT ----- She needs a contrasted CT of the abdomen ----- Message -----    From: Samuel Germany, RN    Sent: 02/10/2016  10:06 AM      To: Donnamae Jude, MD  Amy Kim here for depo-provera. She states and per chart review an abdominal cyst was found incidentially during pregnancy- per chart plan was to get abd ct after pregnancy,. She delivered 07/2015 and she states she got busy.  Will proceed with ordering CT of abd/pelvis with and without contrast and then visit after that or what orders do you desire.  Geralyn Flash, RN

## 2016-02-14 NOTE — Telephone Encounter (Addendum)
Per discussion with  Dr. Kennon Rounds- needs ct abd/pelvis with/without contrast and then appt afterwards to discuss results- will probably need to see surgeon.  Called to schedule and was told by scheduling/needs to be with contrast only.  Appt for CT 02/22/16 at 0900. Needs to pick up contrast before then.    Called [facility] and left message we are calling about an appointment  We have scheduled- please call clinic. Once we have notified patient we need to send to registars to schedule gyn follow up. Also need to preauth CT scan if she has medicaid.

## 2016-02-15 NOTE — Telephone Encounter (Signed)
Called patient and informed her of appt & instructions for contrast. Also told patient someone from the front office will contact her to set up a follow up appt. Patient verbalized understanding & had no questions

## 2016-02-22 ENCOUNTER — Ambulatory Visit (HOSPITAL_COMMUNITY)
Admission: RE | Admit: 2016-02-22 | Discharge: 2016-02-22 | Disposition: A | Discharge status: Home / Self Care | Payer: Medicaid Other | Source: Ambulatory Visit | Attending: Family Medicine | Admitting: Family Medicine

## 2016-02-22 DIAGNOSIS — K7689 Other specified diseases of liver: Secondary | ICD-10-CM | POA: Insufficient documentation

## 2016-02-22 DIAGNOSIS — IMO0002 Reserved for concepts with insufficient information to code with codable children: Secondary | ICD-10-CM

## 2016-02-22 DIAGNOSIS — Z9049 Acquired absence of other specified parts of digestive tract: Secondary | ICD-10-CM | POA: Diagnosis not present

## 2016-02-22 MED ORDER — IOPAMIDOL (ISOVUE-300) INJECTION 61%
100.0000 mL | Freq: Once | INTRAVENOUS | Status: AC | PRN
  Administered 2016-02-22: 100 mL via INTRAVENOUS

## 2016-02-23 ENCOUNTER — Telehealth: Payer: Self-pay | Admitting: General Practice

## 2016-02-23 DIAGNOSIS — K821 Hydrops of gallbladder: Secondary | ICD-10-CM

## 2016-02-23 NOTE — Telephone Encounter (Signed)
Per Dr Kennon Rounds, patient needs appt with general surgery. Scheduled appt for 6/9 @ 10:10 with [facility]. Called patient & informed her of CT results & appt with [facility]. Patient verbalized understanding to all & had no questions

## 2016-03-06 ENCOUNTER — Encounter: Payer: Self-pay | Admitting: *Deleted

## 2016-03-09 ENCOUNTER — Ambulatory Visit: Payer: Self-pay | Admitting: General Surgery

## 2016-03-14 NOTE — Pre-Procedure Instructions (Signed)
Truc Sanor  03/14/2016      [facility] [address] [place], [place] - [address] AT [address] [place] [address] Phone: [phone] Fax: [phone]  [facility] [place] [address]), [place] - [id number] [place] ([place] [address] Phone: [phone] Fax: [phone]    Your procedure is scheduled on Wed, June 28 @ 12:00 PM  Report to [facility] Admitting at 10:00 AM  Call this number if you have problems the morning of surgery:  [phone]   Remember:  Do not eat food or drink liquids after midnight.               No Goody's,BC's,Aleve,Aspirin,Ibuprofen,Motrin,Advil,Fish Oil,or any Herbal Medications.    Do not wear jewelry, make-up or nail polish.  Do not wear lotions, powders, or perfumes.    Do not shave 48 hours prior to surgery.    Do not bring valuables to the hospital.  [facility] is not responsible for any belongings or valuables.  Contacts, dentures or bridgework may not be worn into surgery.  Leave your suitcase in the car.  After surgery it may be brought to your room.  For patients admitted to the hospital, discharge time will be determined by your treatment team.  Patients discharged the day of surgery will not be allowed to drive home.    [facility]  Before surgery, you can play an important role.  Because skin is not sterile, your skin needs to be as free of germs as possible.  You can reduce the number of germs on you skin by washing with CHG (chlorahexidine gluconate) soap before surgery.  CHG is an antiseptic cleaner which kills germs and bonds with the skin to continue killing germs even after washing.  Please DO NOT use if you have an allergy to CHG or antibacterial soaps.  If your skin becomes reddened/irritated stop using the CHG and inform your nurse when you arrive at Short Stay.  Do not shave  (including legs and underarms) for at least 48 hours prior to the first CHG shower.  You may shave your face.  Please follow these instructions carefully:   1.  Shower with CHG Soap the night before surgery and the                                morning of Surgery.  2.  If you choose to wash your hair, wash your hair first as usual with your       normal shampoo.  3.  After you shampoo, rinse your hair and body thoroughly to remove the                      Shampoo.  4.  Use CHG as you would any other liquid soap.  You can apply chg directly       to the skin and wash gently with scrungie or a clean washcloth.  5.  Apply the CHG Soap to your body ONLY FROM THE NECK DOWN.        Do not use on open wounds or open sores.  Avoid contact with your eyes,       ears, mouth and genitals (private parts).  Wash genitals (private parts)       with your normal soap.  6.  Wash thoroughly, paying special attention to the area where your surgery        will be performed.  7.  Thoroughly rinse your body with warm water from the neck down.  8.  DO NOT shower/wash with your normal soap after using and rinsing off       the CHG Soap.  9.  Pat yourself dry with a clean towel.            10.  Wear clean pajamas.            11.  Place clean sheets on your bed the night of your first shower and do not        sleep with pets.  Day of Surgery  Do not apply any lotions/deoderants the morning of surgery.  Please wear clean clothes to the hospital/surgery center.  ns:   Please read over the following fact sheets that you were given. Pain Booklet

## 2016-03-15 ENCOUNTER — Encounter (HOSPITAL_COMMUNITY)
Admission: RE | Admit: 2016-03-15 | Discharge: 2016-03-15 | Disposition: A | Discharge status: Home / Self Care | Payer: Medicaid Other | Source: Ambulatory Visit | Attending: General Surgery | Admitting: General Surgery

## 2016-03-15 ENCOUNTER — Encounter (HOSPITAL_COMMUNITY): Payer: Self-pay

## 2016-03-15 DIAGNOSIS — K8020 Calculus of gallbladder without cholecystitis without obstruction: Secondary | ICD-10-CM | POA: Diagnosis not present

## 2016-03-15 DIAGNOSIS — Z01812 Encounter for preprocedural laboratory examination: Secondary | ICD-10-CM | POA: Diagnosis not present

## 2016-03-15 HISTORY — DX: Dorsalgia, unspecified: M54.9

## 2016-03-15 HISTORY — DX: Personal history of other diseases of the nervous system and sense organs: Z86.69

## 2016-03-15 HISTORY — DX: Frequency of micturition: R35.0

## 2016-03-15 LAB — BASIC METABOLIC PANEL
Anion gap: 8 (ref 5–15)
BUN: 16 mg/dL (ref 6–20)
CHLORIDE: 109 mmol/L (ref 101–111)
CO2: 21 mmol/L — AB (ref 22–32)
Calcium: 9.3 mg/dL (ref 8.9–10.3)
Creatinine, Ser: 0.88 mg/dL (ref 0.44–1.00)
Glucose, Bld: 83 mg/dL (ref 65–99)
POTASSIUM: 3.7 mmol/L (ref 3.5–5.1)
SODIUM: 138 mmol/L (ref 135–145)

## 2016-03-15 LAB — HCG, SERUM, QUALITATIVE: Preg, Serum: NEGATIVE

## 2016-03-15 LAB — CBC
HCT: 37.9 % (ref 36.0–46.0)
Hemoglobin: 12 g/dL (ref 12.0–15.0)
MCH: 25.2 pg — ABNORMAL LOW (ref 26.0–34.0)
MCHC: 31.7 g/dL (ref 30.0–36.0)
MCV: 79.5 fL (ref 78.0–100.0)
PLATELETS: 522 10*3/uL — AB (ref 150–400)
RBC: 4.77 MIL/uL (ref 3.87–5.11)
RDW: 13.9 % (ref 11.5–15.5)
WBC: 8.9 10*3/uL (ref 4.0–10.5)

## 2016-03-15 LAB — NO BLOOD PRODUCTS

## 2016-03-15 MED ORDER — CHLORHEXIDINE GLUCONATE CLOTH 2 % EX PADS: 6.0000 | MEDICATED_PAD | Freq: Once | CUTANEOUS | Status: DC

## 2016-03-15 NOTE — Progress Notes (Signed)
Cardiologist denies  If needs to see a physician will go to [facility]  Echo denies  Stress test denies  Heart cath denies  EKG and CXR denies in past yr

## 2016-03-20 MED ORDER — CEFAZOLIN SODIUM-DEXTROSE 2-4 GM/100ML-% IV SOLN: 2.0000 g | INTRAVENOUS | Status: AC

## 2016-03-21 ENCOUNTER — Encounter (HOSPITAL_COMMUNITY): Payer: Self-pay | Admitting: *Deleted

## 2016-03-21 ENCOUNTER — Ambulatory Visit (HOSPITAL_COMMUNITY): Payer: Medicaid Other | Admitting: Anesthesiology

## 2016-03-21 ENCOUNTER — Ambulatory Visit (HOSPITAL_COMMUNITY)
Admission: RE | Admit: 2016-03-21 | Discharge: 2016-03-21 | Disposition: A | Discharge status: Home / Self Care | Payer: Medicaid Other | Source: Ambulatory Visit | Attending: General Surgery | Admitting: General Surgery

## 2016-03-21 ENCOUNTER — Encounter (HOSPITAL_COMMUNITY)
Admission: RE | Disposition: A | Discharge status: Home / Self Care | Payer: Self-pay | Source: Ambulatory Visit | Attending: General Surgery

## 2016-03-21 DIAGNOSIS — Q402 Other specified congenital malformations of stomach: Secondary | ICD-10-CM | POA: Insufficient documentation

## 2016-03-21 DIAGNOSIS — K828 Other specified diseases of gallbladder: Secondary | ICD-10-CM | POA: Insufficient documentation

## 2016-03-21 DIAGNOSIS — Z793 Long term (current) use of hormonal contraceptives: Secondary | ICD-10-CM | POA: Diagnosis not present

## 2016-03-21 DIAGNOSIS — K821 Hydrops of gallbladder: Secondary | ICD-10-CM | POA: Diagnosis present

## 2016-03-21 HISTORY — PX: CHOLECYSTECTOMY: SHX55

## 2016-03-21 SURGERY — LAPAROSCOPIC CHOLECYSTECTOMY
Anesthesia: General | Site: Abdomen

## 2016-03-21 MED ORDER — ONDANSETRON HCL 4 MG/2ML IJ SOLN
4.0000 mg | Freq: Once | INTRAMUSCULAR | Status: AC | PRN
  Administered 2016-03-21: 4 mg via INTRAVENOUS

## 2016-03-21 MED ORDER — OXYCODONE HCL 5 MG PO TABS
5.0000 mg | ORAL_TABLET | ORAL | Status: DC | PRN
  Administered 2016-03-21: 10 mg via ORAL

## 2016-03-21 MED ORDER — 0.9 % SODIUM CHLORIDE (POUR BTL) OPTIME
TOPICAL | Status: DC | PRN
  Administered 2016-03-21: 1000 mL

## 2016-03-21 MED ORDER — HYDROMORPHONE HCL 1 MG/ML IJ SOLN
INTRAMUSCULAR | Status: AC
  Filled 2016-03-21: qty 1

## 2016-03-21 MED ORDER — LIDOCAINE HCL (CARDIAC) 20 MG/ML IV SOLN
INTRAVENOUS | Status: DC | PRN
  Administered 2016-03-21: 100 mg via INTRAVENOUS

## 2016-03-21 MED ORDER — DEXAMETHASONE SODIUM PHOSPHATE 10 MG/ML IJ SOLN
INTRAMUSCULAR | Status: DC | PRN
  Administered 2016-03-21: 10 mg via INTRAVENOUS

## 2016-03-21 MED ORDER — ACETAMINOPHEN 325 MG PO TABS: 650.0000 mg | ORAL_TABLET | ORAL | Status: DC | PRN

## 2016-03-21 MED ORDER — OXYCODONE-ACETAMINOPHEN 5-325 MG PO TABS: 1.0000 | ORAL_TABLET | ORAL | Status: DC | PRN

## 2016-03-21 MED ORDER — HYDROMORPHONE HCL 1 MG/ML IJ SOLN
0.5000 mg | INTRAMUSCULAR | Status: DC | PRN
  Administered 2016-03-21 (×2): 0.5 mg via INTRAVENOUS

## 2016-03-21 MED ORDER — FENTANYL CITRATE (PF) 100 MCG/2ML IJ SOLN
INTRAMUSCULAR | Status: DC | PRN
  Administered 2016-03-21 (×3): 50 ug via INTRAVENOUS
  Administered 2016-03-21 (×2): 100 ug via INTRAVENOUS

## 2016-03-21 MED ORDER — ESMOLOL HCL 100 MG/10ML IV SOLN
INTRAVENOUS | Status: DC | PRN
  Administered 2016-03-21: 20 mg via INTRAVENOUS

## 2016-03-21 MED ORDER — MIDAZOLAM HCL 2 MG/2ML IJ SOLN
INTRAMUSCULAR | Status: AC
  Filled 2016-03-21: qty 2

## 2016-03-21 MED ORDER — BUPIVACAINE HCL 0.25 % IJ SOLN
INTRAMUSCULAR | Status: DC | PRN
  Administered 2016-03-21: 5 mL

## 2016-03-21 MED ORDER — BUPIVACAINE HCL (PF) 0.25 % IJ SOLN
INTRAMUSCULAR | Status: AC
  Filled 2016-03-21: qty 30

## 2016-03-21 MED ORDER — OXYCODONE HCL 5 MG PO TABS
ORAL_TABLET | ORAL | Status: AC
  Filled 2016-03-21: qty 2

## 2016-03-21 MED ORDER — SUGAMMADEX SODIUM 500 MG/5ML IV SOLN
INTRAVENOUS | Status: AC
  Filled 2016-03-21: qty 5

## 2016-03-21 MED ORDER — MORPHINE SULFATE (PF) 2 MG/ML IV SOLN: 2.0000 mg | INTRAVENOUS | Status: DC | PRN

## 2016-03-21 MED ORDER — LACTATED RINGERS IV SOLN
INTRAVENOUS | Status: DC
  Administered 2016-03-21 (×2): via INTRAVENOUS

## 2016-03-21 MED ORDER — FENTANYL CITRATE (PF) 250 MCG/5ML IJ SOLN
INTRAMUSCULAR | Status: AC
Start: 2016-03-21 — End: 2016-03-21
  Filled 2016-03-21: qty 5

## 2016-03-21 MED ORDER — ONDANSETRON HCL 4 MG/2ML IJ SOLN
INTRAMUSCULAR | Status: DC | PRN
  Administered 2016-03-21 (×2): 4 mg via INTRAVENOUS

## 2016-03-21 MED ORDER — SODIUM CHLORIDE 0.9 % IR SOLN
Status: DC | PRN
  Administered 2016-03-21: 1000 mL

## 2016-03-21 MED ORDER — FENTANYL CITRATE (PF) 250 MCG/5ML IJ SOLN
INTRAMUSCULAR | Status: AC
  Filled 2016-03-21: qty 5

## 2016-03-21 MED ORDER — SODIUM CHLORIDE 0.9% FLUSH: 3.0000 mL | INTRAVENOUS | Status: DC | PRN

## 2016-03-21 MED ORDER — DEXAMETHASONE SODIUM PHOSPHATE 10 MG/ML IJ SOLN
INTRAMUSCULAR | Status: AC
  Filled 2016-03-21: qty 1

## 2016-03-21 MED ORDER — SODIUM CHLORIDE 0.9 % IV SOLN: 250.0000 mL | INTRAVENOUS | Status: DC | PRN

## 2016-03-21 MED ORDER — PHENYLEPHRINE HCL 10 MG/ML IJ SOLN
INTRAMUSCULAR | Status: DC | PRN
  Administered 2016-03-21: 80 ug via INTRAVENOUS

## 2016-03-21 MED ORDER — SUGAMMADEX SODIUM 200 MG/2ML IV SOLN
INTRAVENOUS | Status: AC
  Filled 2016-03-21: qty 2

## 2016-03-21 MED ORDER — ONDANSETRON HCL 4 MG/2ML IJ SOLN
INTRAMUSCULAR | Status: AC
  Filled 2016-03-21: qty 4

## 2016-03-21 MED ORDER — ROCURONIUM BROMIDE 100 MG/10ML IV SOLN
INTRAVENOUS | Status: DC | PRN
  Administered 2016-03-21: 50 mg via INTRAVENOUS

## 2016-03-21 MED ORDER — LIDOCAINE 2% (20 MG/ML) 5 ML SYRINGE
INTRAMUSCULAR | Status: AC
  Filled 2016-03-21: qty 5

## 2016-03-21 MED ORDER — SODIUM CHLORIDE 0.9% FLUSH: 3.0000 mL | Freq: Two times a day (BID) | INTRAVENOUS | Status: DC

## 2016-03-21 MED ORDER — SUGAMMADEX SODIUM 200 MG/2ML IV SOLN
INTRAVENOUS | Status: DC | PRN
  Administered 2016-03-21: 175 mg via INTRAVENOUS

## 2016-03-21 MED ORDER — ONDANSETRON HCL 4 MG/2ML IJ SOLN
INTRAMUSCULAR | Status: AC
  Filled 2016-03-21: qty 2

## 2016-03-21 MED ORDER — PROPOFOL 10 MG/ML IV BOLUS
INTRAVENOUS | Status: DC | PRN
  Administered 2016-03-21: 200 mg via INTRAVENOUS

## 2016-03-21 MED ORDER — CEFAZOLIN SODIUM-DEXTROSE 2-4 GM/100ML-% IV SOLN
INTRAVENOUS | Status: AC
  Administered 2016-03-21: 2 g via INTRAVENOUS
  Filled 2016-03-21: qty 100

## 2016-03-21 MED ORDER — MIDAZOLAM HCL 5 MG/5ML IJ SOLN
INTRAMUSCULAR | Status: DC | PRN
  Administered 2016-03-21: 2 mg via INTRAVENOUS

## 2016-03-21 MED ORDER — PHENYLEPHRINE 40 MCG/ML (10ML) SYRINGE FOR IV PUSH (FOR BLOOD PRESSURE SUPPORT)
PREFILLED_SYRINGE | INTRAVENOUS | Status: AC
  Filled 2016-03-21: qty 10

## 2016-03-21 MED ORDER — ACETAMINOPHEN 650 MG RE SUPP: 650.0000 mg | RECTAL | Status: DC | PRN

## 2016-03-21 MED ORDER — ROCURONIUM BROMIDE 50 MG/5ML IV SOLN
INTRAVENOUS | Status: AC
  Filled 2016-03-21: qty 1

## 2016-03-21 SURGICAL SUPPLY — 38 items
BENZOIN TINCTURE PRP APPL 2/3 (GAUZE/BANDAGES/DRESSINGS) ×3 IMPLANT
CANISTER SUCTION 2500CC (MISCELLANEOUS) ×3 IMPLANT
CHLORAPREP W/TINT 26ML (MISCELLANEOUS) ×3 IMPLANT
CLIP LIGATING HEMO O LOK GREEN (MISCELLANEOUS) ×3 IMPLANT
CLOSURE WOUND 1/2 X4 (GAUZE/BANDAGES/DRESSINGS) ×1
COVER SURGICAL LIGHT HANDLE (MISCELLANEOUS) ×3 IMPLANT
COVER TRANSDUCER ULTRASND (DRAPES) ×3 IMPLANT
DEVICE TROCAR PUNCTURE CLOSURE (ENDOMECHANICALS) ×3 IMPLANT
ELECT REM PT RETURN 9FT ADLT (ELECTROSURGICAL) ×3
ELECTRODE REM PT RTRN 9FT ADLT (ELECTROSURGICAL) ×1 IMPLANT
GAUZE SPONGE 2X2 8PLY STRL LF (GAUZE/BANDAGES/DRESSINGS) ×1 IMPLANT
GLOVE BIO SURGEON STRL SZ7.5 (GLOVE) ×3 IMPLANT
GLOVE EUDERMIC 7 POWDERFREE (GLOVE) ×3 IMPLANT
GOWN STRL REUS W/ TWL LRG LVL3 (GOWN DISPOSABLE) ×2 IMPLANT
GOWN STRL REUS W/ TWL XL LVL3 (GOWN DISPOSABLE) ×1 IMPLANT
GOWN STRL REUS W/TWL LRG LVL3 (GOWN DISPOSABLE) ×4
GOWN STRL REUS W/TWL XL LVL3 (GOWN DISPOSABLE) ×2
KIT BASIN OR (CUSTOM PROCEDURE TRAY) ×3 IMPLANT
KIT ROOM TURNOVER OR (KITS) ×3 IMPLANT
NEEDLE INSUFFLATION 14GA 120MM (NEEDLE) ×3 IMPLANT
NS IRRIG 1000ML POUR BTL (IV SOLUTION) ×3 IMPLANT
PAD ARMBOARD 7.5X6 YLW CONV (MISCELLANEOUS) ×6 IMPLANT
POUCH RETRIEVAL ECOSAC 10 (ENDOMECHANICALS) ×1 IMPLANT
POUCH RETRIEVAL ECOSAC 10MM (ENDOMECHANICALS) ×2
SCISSORS LAP 5X35 DISP (ENDOMECHANICALS) ×3 IMPLANT
SET IRRIG TUBING LAPAROSCOPIC (IRRIGATION / IRRIGATOR) ×3 IMPLANT
SLEEVE ENDOPATH XCEL 5M (ENDOMECHANICALS) ×6 IMPLANT
SPECIMEN JAR SMALL (MISCELLANEOUS) ×3 IMPLANT
SPONGE GAUZE 2X2 STER 10/PKG (GAUZE/BANDAGES/DRESSINGS) ×2
STRIP CLOSURE SKIN 1/2X4 (GAUZE/BANDAGES/DRESSINGS) ×2 IMPLANT
SUT MNCRL AB 3-0 PS2 18 (SUTURE) ×3 IMPLANT
SUT MNCRL AB 4-0 PS2 18 (SUTURE) ×3 IMPLANT
TOWEL OR 17X24 6PK STRL BLUE (TOWEL DISPOSABLE) ×3 IMPLANT
TOWEL OR 17X26 10 PK STRL BLUE (TOWEL DISPOSABLE) IMPLANT
TRAY LAPAROSCOPIC MC (CUSTOM PROCEDURE TRAY) ×3 IMPLANT
TROCAR XCEL NON-BLD 11X100MML (ENDOMECHANICALS) ×3 IMPLANT
TROCAR XCEL NON-BLD 5MMX100MML (ENDOMECHANICALS) ×3 IMPLANT
TUBING INSUFFLATION (TUBING) ×3 IMPLANT

## 2016-03-21 NOTE — H&P (Signed)
History of Present Illness The patient is a 30 year old female who presents for evaluation of gall stones. The patient is a 30 year old female who is referred by Dr. Jeannetta Nap for an evaluation of a large hydropic gallbladder. Patient does state that she has pain to the right upper quadrant at times. Patient underwent ultrasound while pregnant which revealed a large right upper quadrant/abdominal mass. Patient ultimately underwent a CT scan which revealed a large cystic/hydropic gallbladder. Patient has undergone 3 previous C-sections. Patient's last childbirth was approximate 6 months ago.   Past Surgical History Cesarean Section - Multiple  Allergies  No Known Drug Allergies06/05/2016  Medication History  No Current Medications Medications Reconciled  Social History Tobacco use Never smoker.    Review of Systems  General Present- Feeling well. Not Present- Fever. Respiratory Not Present- Cough and Difficulty Breathing. Cardiovascular Not Present- Chest Pain. Gastrointestinal Present- Abdominal Pain and Nausea. Female Genitourinary Not Present- Frequency, Nocturia, Painful Urination, Pelvic Pain and Urgency. Musculoskeletal Not Present- Myalgia. Neurological Present- Headaches. Not Present- Decreased Memory, Fainting, Numbness, Seizures, Tingling, Tremor, Trouble walking and Weakness. Psychiatric Not Present- Anxiety, Bipolar, Change in Sleep Pattern, Depression, Fearful and Frequent crying. Endocrine Not Present- Cold Intolerance, Excessive Hunger, Hair Changes, Heat Intolerance, Hot flashes and New Diabetes. Hematology Not Present- Easy Bruising, Excessive bleeding, Gland problems, HIV and Persistent Infections.  BP 122/70 mmHg  Pulse 83  Temp(Src) 98.6 F (37 C) (Oral)  Resp 20  Wt 85.73 kg (189 lb)  SpO2 100%   Physical Exam General Mental Status-Alert. General Appearance-Consistent with stated age. Hydration-Well hydrated. Voice-Normal.  Head and  Neck Head-normocephalic, atraumatic with no lesions or palpable masses.  Eye Eyeball - Bilateral-Extraocular movements intact. Sclera/Conjunctiva - Bilateral-No scleral icterus.  Chest and Lung Exam Chest and lung exam reveals -quiet, even and easy respiratory effort with no use of accessory muscles. Inspection Chest Wall - Normal. Back - normal.  Cardiovascular Cardiovascular examination reveals -normal heart sounds, regular rate and rhythm with no murmurs.  Abdomen Inspection Normal Exam - No Hernias. Palpation/Percussion Normal exam - Soft, Non Tender, No Rebound tenderness, No Rigidity (guarding) and No hepatosplenomegaly. Abdominal Mass Palpable - Location - Note: Right upper quadrant mass palpable on deep palpation. Auscultation Normal exam - Bowel sounds normal.  Neurologic Neurologic evaluation reveals -alert and oriented x 3 with no impairment of recent or remote memory. Mental Status-Normal.  Musculoskeletal Normal Exam - Left-Upper Extremity Strength Normal and Lower Extremity Strength Normal. Normal Exam - Right-Upper Extremity Strength Normal, Lower Extremity Weakness.    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 Patient is symptomatic from this.  1. We will proceed to the operating room for a laparoscopic cholecystectomy, possible open cholecystectomy. 2. Risks and benefits were discussed with the patient to generally include, but not limited to: infection, bleeding, possible need for post op ERCP, damage to the bile ducts, bile leak, and possible need for further surgery. Alternatives were offered and described. All questions were answered and the patient voiced understanding of the procedure and wishes to proceed at this point with a laparoscopic cholecystectomy

## 2016-03-21 NOTE — Transfer of Care (Signed)
Immediate Anesthesia Transfer of Care Note  Patient: Amy Kim  Procedure(s) Performed: Procedure(s): LAPAROSCOPIC CHOLECYSTECTOMY (N/A)  Patient Location: PACU  Anesthesia Type:General  Level of Consciousness:  sedated, patient cooperative and responds to stimulation  Airway & Oxygen Therapy:Patient Spontanous Breathing and Patient connected to face mask oxgen  Post-op Assessment:  Report given to PACU RN and Post -op Vital signs reviewed and stable  Post vital signs:  Reviewed and stable  Last Vitals:  Filed Vitals:   03/21/16 1003 03/21/16 1333  BP: 122/70   Pulse: 83   Temp: 37 C 36.8 C  Resp: 20 24    Complications: No apparent anesthesia complications

## 2016-03-21 NOTE — Anesthesia Procedure Notes (Signed)
Procedure Name: Intubation Date/Time: 03/21/2016 1:08 PM Performed by: Freddie Breech Pre-anesthesia Checklist: Patient identified, Emergency Drugs available, Suction available, Patient being monitored and Timeout performed Patient Re-evaluated:Patient Re-evaluated prior to inductionOxygen Delivery Method: Circle system utilized Preoxygenation: Pre-oxygenation with 100% oxygen Intubation Type: IV induction Ventilation: Mask ventilation without difficulty Laryngoscope Size: Mac and 3 Grade View: Grade II Tube type: Oral Tube size: 7.0 mm Number of attempts: 1 Airway Equipment and Method: Patient positioned with wedge pillow and Stylet Placement Confirmation: ETT inserted through vocal cords under direct vision,  positive ETCO2,  CO2 detector and breath sounds checked- equal and bilateral Secured at: 22 cm Tube secured with: Tape Dental Injury: Teeth and Oropharynx as per pre-operative assessment

## 2016-03-21 NOTE — Anesthesia Preprocedure Evaluation (Signed)
Anesthesia Evaluation  Patient identified by MRN, date of birth, ID band Patient awake    Reviewed: Allergy & Precautions, NPO status , reviewed documented beta blocker date and time   Airway Mallampati: I  TM Distance: >3 FB     Dental   Pulmonary    Pulmonary exam normal        Cardiovascular Normal cardiovascular exam     Neuro/Psych    GI/Hepatic   Endo/Other    Renal/GU      Musculoskeletal   Abdominal   Peds  Hematology   Anesthesia Other Findings   Reproductive/Obstetrics                             Anesthesia Physical Anesthesia Plan  ASA: I  Anesthesia Plan: General   Post-op Pain Management:    Induction: Intravenous  Airway Management Planned: Oral ETT  Additional Equipment:   Intra-op Plan:   Post-operative Plan: Extubation in OR  Informed Consent: I have reviewed the patients History and Physical, chart, labs and discussed the procedure including the risks, benefits and alternatives for the proposed anesthesia with the patient or authorized representative who has indicated his/her understanding and acceptance.     Plan Discussed with: CRNA, Anesthesiologist and Surgeon  Anesthesia Plan Comments:         Anesthesia Quick Evaluation

## 2016-03-21 NOTE — Op Note (Signed)
03/21/2016  1:14 PM  PATIENT:  Amy Kim  30 y.o. female  PRE-OPERATIVE DIAGNOSIS:  hydroptic gallbladder  POST-OPERATIVE DIAGNOSIS:  Same  PROCEDURE:  Procedure(s): LAPAROSCOPIC CHOLECYSTECTOMY (N/A)  SURGEON:  Surgeon(s) and Role:    * Ralene Ok, MD - Primary    * Fanny Skates, MD - Assisting  ANESTHESIA:   local and general  EBL:<5cc  Total I/O In: 1000 [I.V.:1000] Out: -   BLOOD ADMINISTERED:none  DRAINS: none   LOCAL MEDICATIONS USED:  BUPIVICAINE   SPECIMEN:  Source of Specimen:  gallbladder  DISPOSITION OF SPECIMEN:  PATHOLOGY  COUNTS:  YES  TOURNIQUET:  * No tourniquets in log *  DICTATION: .Dragon Dictation The patient was taken to the operating and placed in the supine position with bilateral SCDs in place. The patient was prepped and draped in the usual sterile fashion. A time out was called and all facts were verified. A pneumoperitoneum was obtained via A Veress needle technique to a pressure of 3mm of mercury at Palmer's point.  A 6mm trochar was then placed in the left upper quadrant under visualization, and there were no injuries to any abdominal organs. A 11 mm port was then placed in the umbilical region after infiltrating with local anesthesia under direct visualization. A second and third port right upper quadrant and right lower quadrant port placement under direct visualization, respectively.  The gallbladder was identified and seen to be very distended.  The gallbladder was drained with suction.  750cc were aspirated of bile.  The gallbladder was easily retracted, the peritoneum was then sharply dissected from the gallbladder and this dissection was carried down to Calot's triangle. The gallbladder was identified and stripped away circumferentially and seen going into the gallbladder 360, the critical angle was obtained.  2 clips were placed proximally one distally and the cystic duct transected. The cystic artery was identified and 2  clips placed proximally and one distally and transected. We then proceeded to remove the gallbladder off the hepatic fossa with Bovie cautery. A retrieval bag was then placed in the abdomen and gallbladder placed in the bag. The hepatic fossa was then reexamined and hemostasis was achieved with Bovie cautery and was excellent at the end of the case. The subhepatic fossa and perihepatic fossa was then irrigated until the effluent was clear.  The gallbladder and bag were removed from the abdominal cavity. The 11 mm trocar fascia was reapproximated with the Endo Close #1 Vicryl x2. The pneumoperitoneum was evacuated and all trochars removed under direct visulalization. The skin was then closed with 4-0 Monocryl and the skin dressed with Steri-Strips, gauze, and tape. The patient was awaken from general anesthesia and taken to the recovery room in stable condition.   PLAN OF CARE: Discharge to home after PACU  PATIENT DISPOSITION:  PACU - hemodynamically stable.   Delay start of Pharmacological VTE agent (>24hrs) due to surgical blood loss or risk of bleeding: not applicable

## 2016-03-21 NOTE — Anesthesia Postprocedure Evaluation (Signed)
Anesthesia Post Note  Patient: [name]  Procedure(s) Performed: Procedure(s) (LRB): LAPAROSCOPIC CHOLECYSTECTOMY (N/A)  Patient location during evaluation: PACU Anesthesia Type: General Level of consciousness: awake, sedated, oriented and patient cooperative Pain management: pain level controlled Vital Signs Assessment: post-procedure vital signs reviewed and stable Respiratory status: spontaneous breathing and respiratory function stable Cardiovascular status: blood pressure returned to baseline and stable Anesthetic complications: no    Last Vitals:  Filed Vitals:   03/21/16 1410 03/21/16 1425  BP: 149/88 137/94  Pulse: 94 90  Temp:    Resp: 25 19    Last Pain:  Filed Vitals:   03/21/16 1431  PainSc: 5                  [name] EDWARD

## 2016-03-21 NOTE — Discharge Instructions (Signed)
[facility] ______[facility] °LAPAROSCOPIC SURGERY: POST OP INSTRUCTIONS °Always review your discharge instruction sheet given to you by the facility where your surgery was performed. °IF YOU HAVE DISABILITY OR FAMILY LEAVE FORMS, YOU MUST BRING THEM TO THE OFFICE FOR PROCESSING.   °DO NOT GIVE THEM TO YOUR DOCTOR. ° °1. A prescription for pain medication may be given to you upon discharge.  Take your pain medication as prescribed, if needed.  If narcotic pain medicine is not needed, then you may take acetaminophen (Tylenol) or ibuprofen (Advil) as needed. °2. Take your usually prescribed medications unless otherwise directed. °3. If you need a refill on your pain medication, please contact your pharmacy.  They will contact our office to request authorization. Prescriptions will not be filled after 5pm or on week-ends. °4. You should follow a light diet the first few days after arrival home, such as soup and crackers, etc.  Be sure to include lots of fluids daily. °5. Most patients will experience some swelling and bruising in the area of the incisions.  Ice packs will help.  Swelling and bruising can take several days to resolve.  °6. It is common to experience some constipation if taking pain medication after surgery.  Increasing fluid intake and taking a stool softener (such as Colace) will usually help or prevent this problem from occurring.  A mild laxative (Milk of Magnesia or Miralax) should be taken according to package instructions if there are no bowel movements after 48 hours. °7. Unless discharge instructions indicate otherwise, you may remove your bandages 24-48 hours after surgery, and you may shower at that time.  You may have steri-strips (small skin tapes) in place directly over the incision.  These strips should be left on the skin for 7-10 days.  If your surgeon used skin glue on the incision, you may shower in 24 hours.  The glue will flake off over the next 2-3 weeks.  Any sutures or  staples will be removed at the office during your follow-up visit. °8. ACTIVITIES:  You may resume regular (light) daily activities beginning the next day--such as daily self-care, walking, climbing stairs--gradually increasing activities as tolerated.  You may have sexual intercourse when it is comfortable.  Refrain from any heavy lifting or straining until approved by your doctor. °a. You may drive when you are no longer taking prescription pain medication, you can comfortably wear a seatbelt, and you can safely maneuver your car and apply brakes. °b. RETURN TO WORK:  __________________________________________________________ °9. You should see your doctor in the office for a follow-up appointment approximately 2-3 weeks after your surgery.  Make sure that you call for this appointment within a day or two after you arrive home to insure a convenient appointment time. °10. OTHER INSTRUCTIONS: __________________________________________________________________________________________________________________________ __________________________________________________________________________________________________________________________ °WHEN TO CALL YOUR DOCTOR: °1. Fever over 101.0 °2. Inability to urinate °3. Continued bleeding from incision. °4. Increased pain, redness, or drainage from the incision. °5. Increasing abdominal pain ° °The clinic staff is available to answer your questions during regular business hours.  Please don’t hesitate to call and ask to speak to one of the nurses for clinical concerns.  If you have a medical emergency, go to the nearest emergency room or call 911.  A surgeon from [facility] is always on call at the hospital. °[address], [address], [place], [place]  [address] ? [address] [address], , [place]   [address] °[phone] ? [phone] ? FAX [phone] °Web site:   [email or web address] °

## 2016-03-22 ENCOUNTER — Encounter (HOSPITAL_COMMUNITY): Payer: Self-pay | Admitting: General Surgery

## 2016-05-01 ENCOUNTER — Ambulatory Visit (INDEPENDENT_AMBULATORY_CARE_PROVIDER_SITE_OTHER): Payer: Medicaid Other | Admitting: General Practice

## 2016-05-01 VITALS — BP 119/78 | HR 83 | Ht 61.0 in | Wt 185.0 lb

## 2016-05-01 DIAGNOSIS — Z3042 Encounter for surveillance of injectable contraceptive: Secondary | ICD-10-CM

## 2016-07-17 ENCOUNTER — Ambulatory Visit (INDEPENDENT_AMBULATORY_CARE_PROVIDER_SITE_OTHER): Payer: Medicaid Other | Admitting: *Deleted

## 2016-07-17 VITALS — BP 128/74 | HR 80 | Wt 185.8 lb

## 2016-07-17 DIAGNOSIS — Z3042 Encounter for surveillance of injectable contraceptive: Secondary | ICD-10-CM | POA: Diagnosis not present

## 2016-07-17 MED ORDER — MEDROXYPROGESTERONE ACETATE 150 MG/ML IM SUSP
150.0000 mg | Freq: Once | INTRAMUSCULAR | Status: AC
  Administered 2016-07-17: 150 mg via INTRAMUSCULAR

## 2016-07-17 NOTE — Progress Notes (Signed)
Pt here for scheduled depo Provera injection. She tolerated procedure well and has no complaints. Next injection due 10/02/16-10/16/16

## 2016-10-02 ENCOUNTER — Ambulatory Visit (INDEPENDENT_AMBULATORY_CARE_PROVIDER_SITE_OTHER): Payer: Medicaid Other | Admitting: General Practice

## 2016-10-02 DIAGNOSIS — Z3042 Encounter for surveillance of injectable contraceptive: Secondary | ICD-10-CM

## 2016-12-31 ENCOUNTER — Ambulatory Visit: Payer: Medicaid Other

## 2016-12-31 ENCOUNTER — Other Ambulatory Visit (HOSPITAL_COMMUNITY)
Admission: RE | Admit: 2016-12-31 | Discharge: 2016-12-31 | Disposition: A | Discharge status: Home / Self Care | Payer: Medicaid Other | Source: Ambulatory Visit | Attending: Family Medicine | Admitting: Family Medicine

## 2016-12-31 ENCOUNTER — Encounter: Payer: Self-pay | Admitting: Advanced Practice Midwife

## 2016-12-31 ENCOUNTER — Ambulatory Visit (INDEPENDENT_AMBULATORY_CARE_PROVIDER_SITE_OTHER): Payer: Medicaid Other | Admitting: Advanced Practice Midwife

## 2016-12-31 VITALS — BP 140/96 | HR 96 | Wt 195.3 lb

## 2016-12-31 DIAGNOSIS — Z124 Encounter for screening for malignant neoplasm of cervix: Secondary | ICD-10-CM | POA: Insufficient documentation

## 2016-12-31 DIAGNOSIS — Z3042 Encounter for surveillance of injectable contraceptive: Secondary | ICD-10-CM | POA: Diagnosis not present

## 2016-12-31 NOTE — Progress Notes (Signed)
Subjective:     Patient ID: Amy Kim, female   DOB: [birth year], 31 y.o.   MRN: [id number]  HPI [id number] presents to the office for annual exam/Pap and Depo provera injection. She denies any gyn complications or problems. She denies pain or irregular vaginal bleeding.    Review of Systems  Constitutional: Negative for chills, fatigue and fever.  Respiratory: Negative for shortness of breath.   Cardiovascular: Negative for chest pain.  Gastrointestinal: Negative for abdominal pain, nausea and vomiting.  Genitourinary: Negative for difficulty urinating, dysuria, flank pain, pelvic pain, vaginal bleeding, vaginal discharge and vaginal pain.  Neurological: Negative for dizziness and headaches.  Psychiatric/Behavioral: Negative.        Objective:   Physical Exam  BP (!) 140/96   Pulse 96   Wt 195 lb 4.8 oz (88.6 kg)   Breastfeeding? No   BMI 36.90 kg/m    VS reviewed, nursing note reviewed,  Constitutional: well developed, well nourished, no distress HEENT: normocephalic CV: normal rate HEART: normal rate, heart sounds, regular rhythm RESP: normal effort, lung sounds clear and equal bilaterally Abdomen: soft Neuro: alert and oriented x 3 Skin: warm, dry Psych: affect normal  Pelvic exam: Cervix pink, visually closed, without lesion, scant white creamy discharge, vaginal walls and external genitalia normal     Assessment:     1. Surveillance for Depo-Provera contraception --Discussed LARCs as most effective forms of birth control.  Pt desires to continue Depo at this time.   --Depo injection given today.    2. Encounter for screening for cervical cancer  --- Cytology - PAP    Plan:     F/U in 3 months for Depo injection, well woman exam in 1 year

## 2017-01-02 LAB — CYTOLOGY - PAP
CHLAMYDIA, DNA PROBE: NEGATIVE
DIAGNOSIS: NEGATIVE
HPV (WINDOPATH): NOT DETECTED
Neisseria Gonorrhea: NEGATIVE

## 2017-02-14 ENCOUNTER — Ambulatory Visit (INDEPENDENT_AMBULATORY_CARE_PROVIDER_SITE_OTHER): Payer: Self-pay | Admitting: *Deleted

## 2017-02-14 DIAGNOSIS — Z3200 Encounter for pregnancy test, result unknown: Secondary | ICD-10-CM

## 2017-02-14 DIAGNOSIS — Z3202 Encounter for pregnancy test, result negative: Secondary | ICD-10-CM

## 2017-02-14 LAB — POCT PREGNANCY, URINE: Preg Test, Ur: NEGATIVE

## 2017-02-14 NOTE — Progress Notes (Signed)
When I called pt from lobby to inform her of negative UPT results I was advised that while waiting for results of UPT, pt informed front office staff that she had misread her home UPT and that in fact it was negative - not positive as previously thought. She was not in lobby and I was not able to tell her of negative UPT results here.

## 2017-03-18 ENCOUNTER — Ambulatory Visit: Payer: Medicaid Other

## 2017-06-27 IMAGING — CT CT ABD-PELV W/ CM
1 of 2 series · 15 of 32 positions shown, 19 images · IV contrast (OMNIPAQUE)
Comparison: None.

CLINICAL DATA: Evaluate liver cyst

EXAM:
CT ABDOMEN AND PELVIS WITH CONTRAST
TECHNIQUE: Multidetector CT imaging of the abdomen and pelvis was performed
using the standard protocol following bolus administration of
intravenous contrast.
CONTRAST:  100mL [id number] IOPAMIDOL ([id number]) INJECTION 61%

[Series 2: routine abdomen/pelvis with · axial · 0.94mm/px · z∈[-467,-67]mm · 15 of 88 slices shown, 19 images]
[im 4/88  soft-tissue]
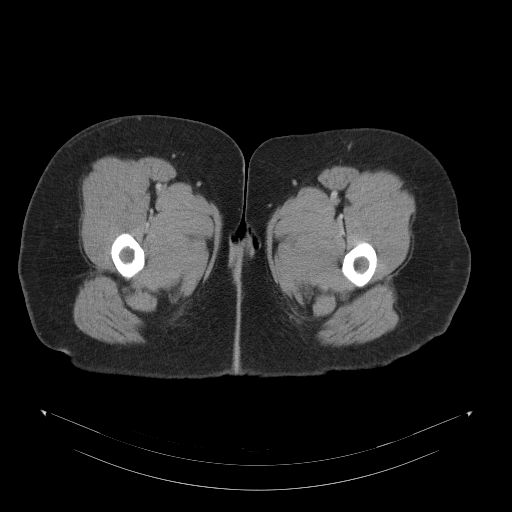
[im 4/88  bone]
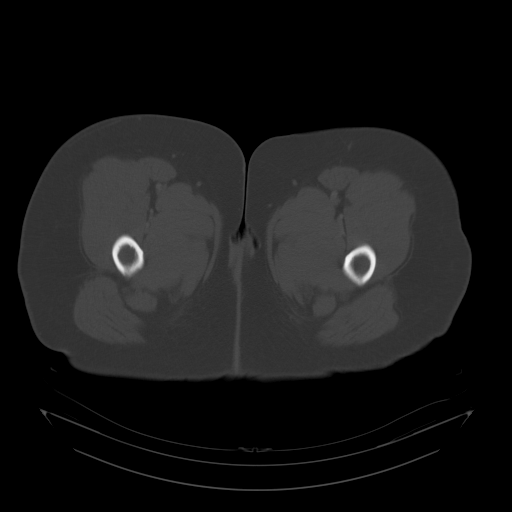
[im 11/88  soft-tissue]
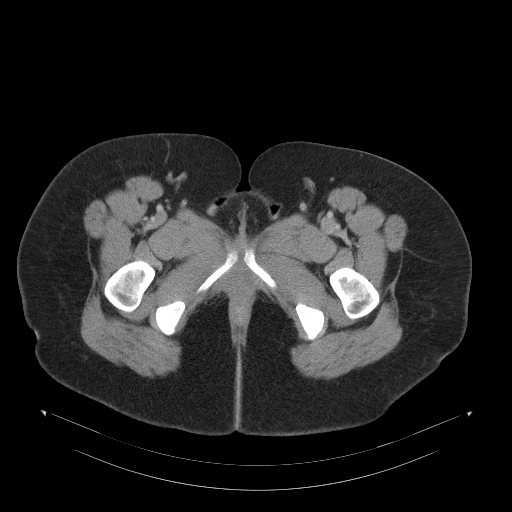
[im 18/88  soft-tissue]
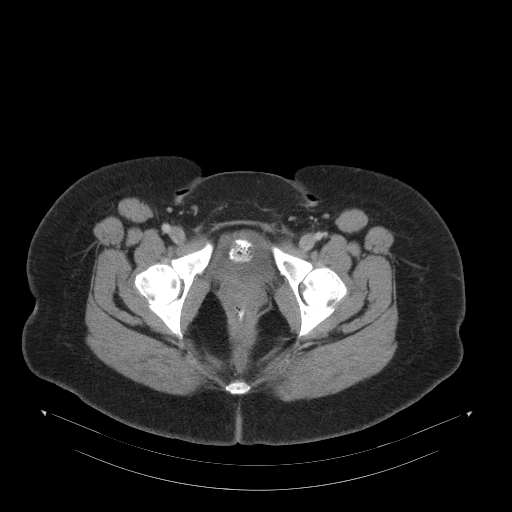
[im 25/88  soft-tissue]
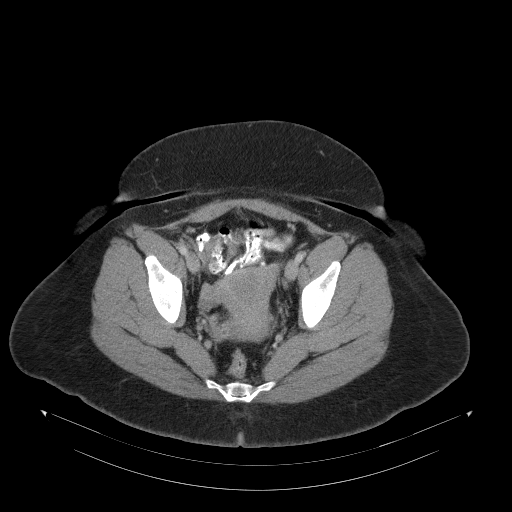
[im 32/88  soft-tissue]
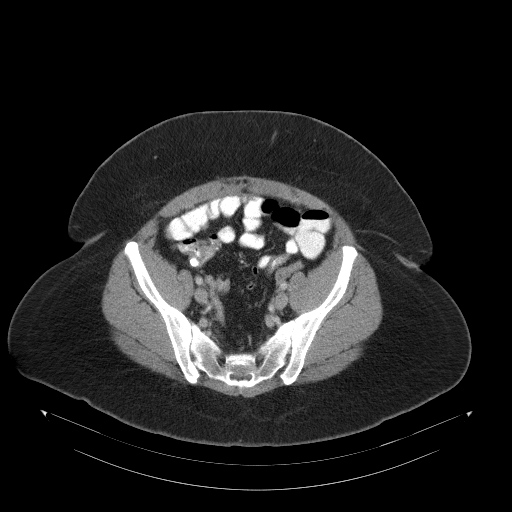
[im 39/88  soft-tissue]
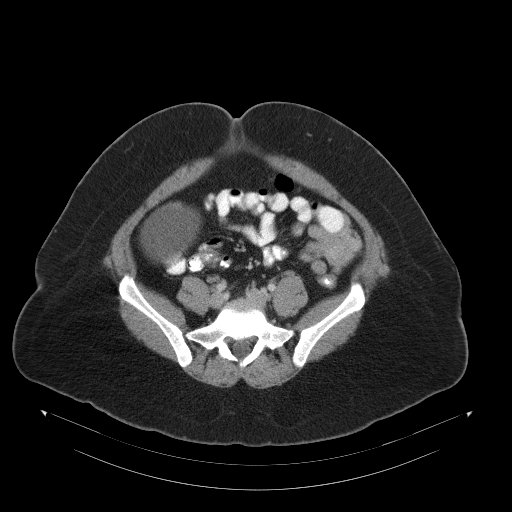
[im 46/88  soft-tissue]
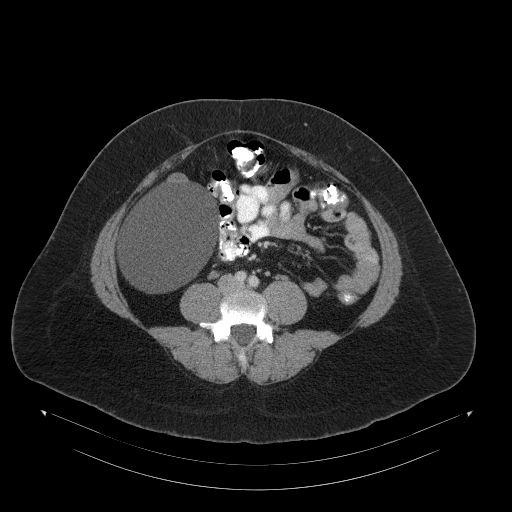
[im 49/88  soft-tissue]
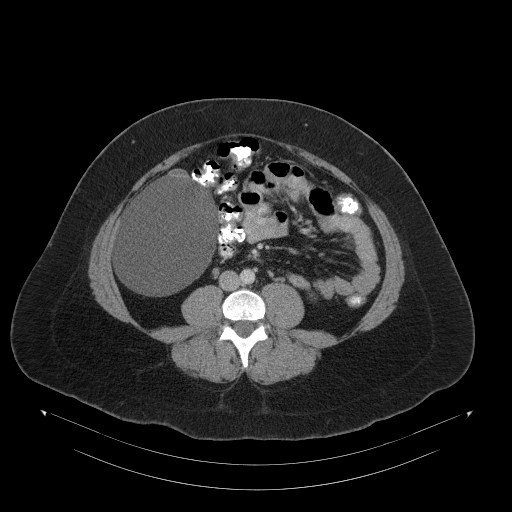
[im 56/88  soft-tissue]
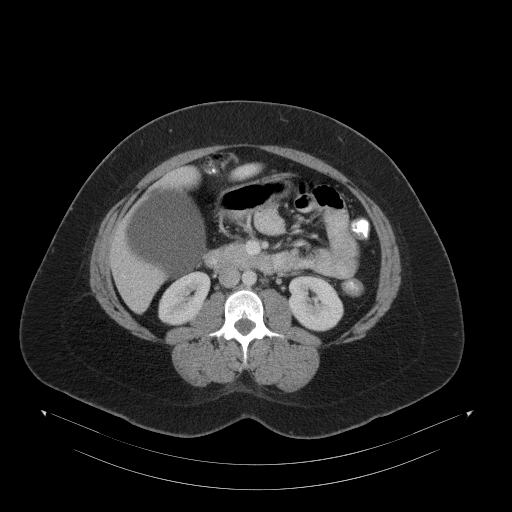
[im 56/88  bone]
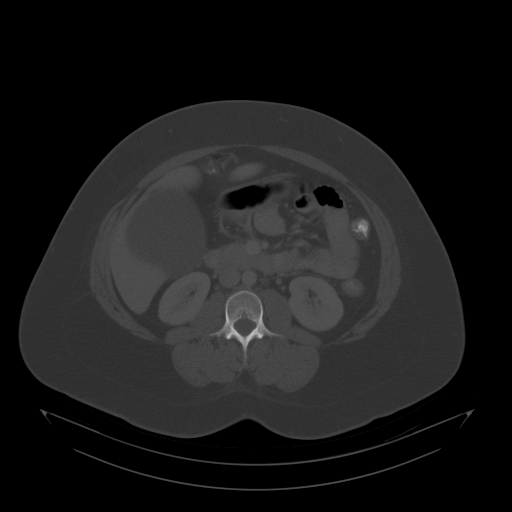
[im 63/88  soft-tissue]
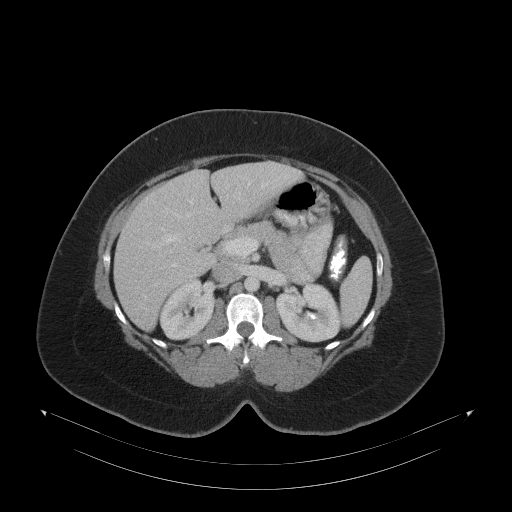
[im 70/88  soft-tissue]
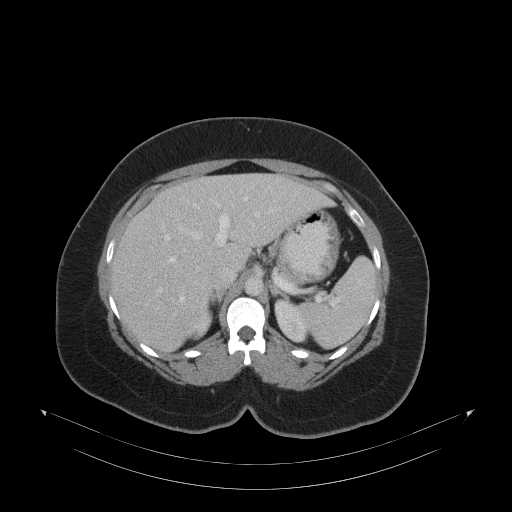
[im 74/88  lung]
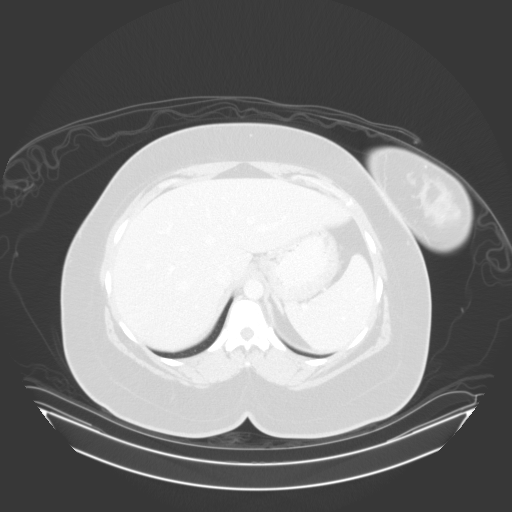
[im 77/88  soft-tissue]
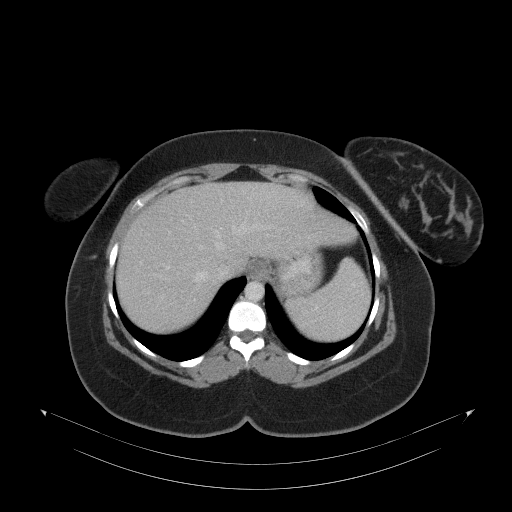
[im 77/88  lung]
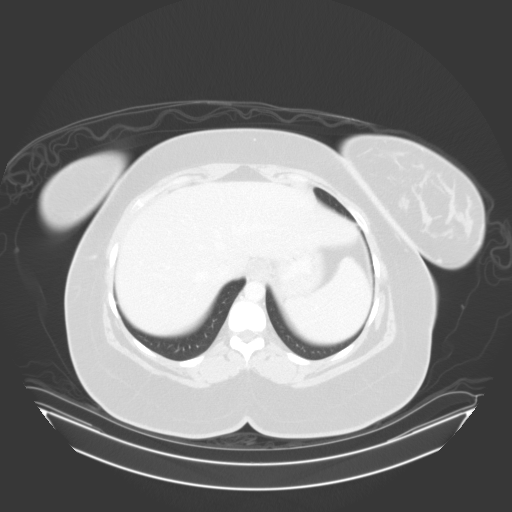
[im 81/88  lung]
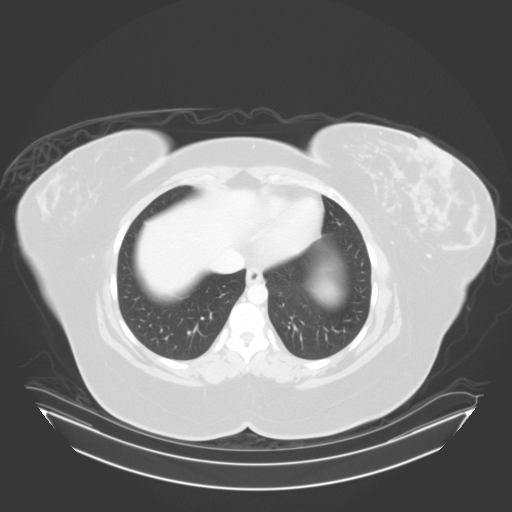
[im 84/88  soft-tissue]
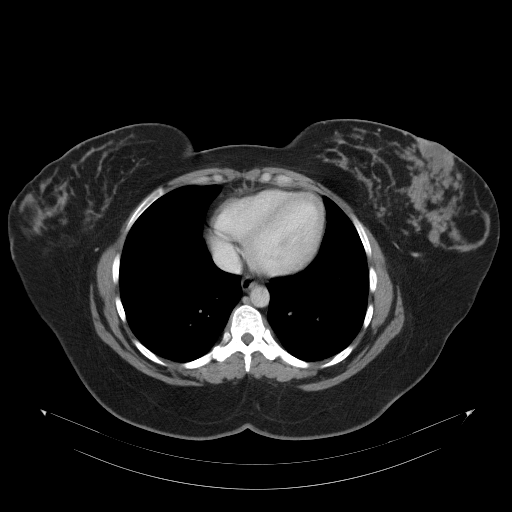
[im 84/88  lung]
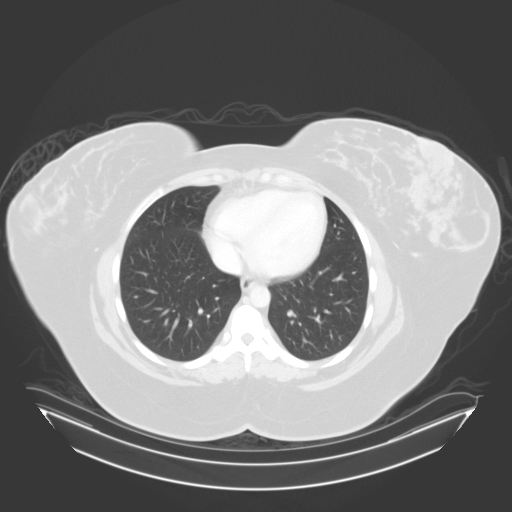

[15 of 32 positions shown; findings below may reference images not displayed]

FINDINGS: Lower chest: The lung bases are clear. No pleural or pericardial
effusion.

Hepatobiliary: No focal liver abnormality identified. Large cystic
structure within the right upper quadrant of the abdomen is
identified measure 10.7 x 9.6 by 10.2 cm. No intrahepatic bile duct
dilatation. The common bile duct is normal in caliber.

Pancreas: No mass, inflammatory changes, or other significant
abnormality.

Spleen: Within normal limits in size and appearance.

Adrenals/Urinary Tract: Normal adrenal glands.

Stomach/Bowel: The stomach appears normal. No dilated loops of small
or large bowel identified. Normal appearance of the colon. The
appendix is visualized and appears normal.

Vascular/Lymphatic: Normal appearance of the abdominal aorta. No
enlarged retroperitoneal or mesenteric adenopathy. No enlarged
pelvic or inguinal lymph nodes.

Reproductive: The uterus and adnexal structures are unremarkable.

Other: There is no ascites or focal fluid collections within the
abdomen or pelvis.

Musculoskeletal: No aggressive lytic or sclerotic bone lesions
identified.
IMPRESSION: 1. Large cystic structure within the right upper quadrant of the
abdomen is identified. In the absence of prior cholecystectomy this
is felt a likely represent a hydropic gallbladder. Nuclear medicine
hepatobiliary scan may be helpful to confirm.
2. No additional abnormalities identified within the abdomen or
pelvis.

## 2018-01-28 ENCOUNTER — Ambulatory Visit: Payer: Self-pay

## 2018-01-29 ENCOUNTER — Ambulatory Visit (INDEPENDENT_AMBULATORY_CARE_PROVIDER_SITE_OTHER): Payer: Medicaid Other | Admitting: General Practice

## 2018-01-29 ENCOUNTER — Encounter: Payer: Self-pay | Admitting: General Practice

## 2018-01-29 ENCOUNTER — Encounter: Payer: Self-pay | Admitting: Nurse Practitioner

## 2018-01-29 DIAGNOSIS — Z3201 Encounter for pregnancy test, result positive: Secondary | ICD-10-CM | POA: Diagnosis present

## 2018-01-29 LAB — POCT PREGNANCY, URINE: Preg Test, Ur: POSITIVE — AB

## 2018-01-29 NOTE — Progress Notes (Signed)
I have reviewed the chart and agree with nursing staff's documentation of this patient's encounter.  Mora Bellman, MD 01/29/2018 2:39 PM

## 2018-01-29 NOTE — Progress Notes (Signed)
Patient here for UPT today. UPT +. Patient reports first positive home test last Wednesday. LMP 12/27/17 EDD 10/03/18 [redacted]w[redacted]d. Patient reports only taking PNV. Recommended she begin OB care. Patient verbalized understanding & had no questions.

## 2018-02-05 ENCOUNTER — Other Ambulatory Visit: Payer: Self-pay

## 2018-02-05 ENCOUNTER — Inpatient Hospital Stay (HOSPITAL_COMMUNITY)
Admission: AD | Admit: 2018-02-05 | Discharge: 2018-02-05 | Disposition: A | Discharge status: Home / Self Care | Payer: Medicaid Other | Source: Ambulatory Visit | Attending: Obstetrics and Gynecology | Admitting: Obstetrics and Gynecology

## 2018-02-05 ENCOUNTER — Encounter (HOSPITAL_COMMUNITY): Payer: Self-pay | Admitting: *Deleted

## 2018-02-05 ENCOUNTER — Inpatient Hospital Stay (HOSPITAL_COMMUNITY): Payer: Medicaid Other

## 2018-02-05 DIAGNOSIS — Z3490 Encounter for supervision of normal pregnancy, unspecified, unspecified trimester: Secondary | ICD-10-CM

## 2018-02-05 DIAGNOSIS — Z3A01 Less than 8 weeks gestation of pregnancy: Secondary | ICD-10-CM | POA: Insufficient documentation

## 2018-02-05 DIAGNOSIS — O209 Hemorrhage in early pregnancy, unspecified: Secondary | ICD-10-CM | POA: Diagnosis not present

## 2018-02-05 LAB — WET PREP, GENITAL
Sperm: NONE SEEN
TRICH WET PREP: NONE SEEN
Yeast Wet Prep HPF POC: NONE SEEN

## 2018-02-05 LAB — CBC
HCT: 37.9 % (ref 36.0–46.0)
Hemoglobin: 12.3 g/dL (ref 12.0–15.0)
MCH: 26.3 pg (ref 26.0–34.0)
MCHC: 32.5 g/dL (ref 30.0–36.0)
MCV: 81.2 fL (ref 78.0–100.0)
Platelets: 489 10*3/uL — ABNORMAL HIGH (ref 150–400)
RBC: 4.67 MIL/uL (ref 3.87–5.11)
RDW: 15.7 % — AB (ref 11.5–15.5)
WBC: 9.5 10*3/uL (ref 4.0–10.5)

## 2018-02-05 LAB — HCG, QUANTITATIVE, PREGNANCY: HCG, BETA CHAIN, QUANT, S: 24367 m[IU]/mL — AB (ref <5)

## 2018-02-05 NOTE — Discharge Instructions (Signed)
Prenatal Care Providers [facility]  [phone]     Phone: [place]                      [facility]  @[place]     Phone: [phone]  Phone: [place]     Phone: [phone]  Phone: [facility] [place]                hone: [phone]  Phone: [phone]         [facility] [address]      Phone: [phone]  Phone: [phone]         [place] [facility]                Phone: [phone]  [place])          Phone: [phone] [facility]   Phone: [phone] Safe Medications in Pregnancy   Acne: Benzoyl Peroxide Salicylic Acid  Backache/Headache: Tylenol: 2 regular strength every 4 hours OR              2 Extra strength every 6 hours  Colds/Coughs/Allergies: Benadryl (alcohol free) 25 mg every 6 hours as needed Breath right strips Claritin Cepacol throat lozenges Chloraseptic throat spray Cold-Eeze- up to three times per day Cough drops, alcohol free Flonase (by prescription only) Guaifenesin Mucinex Robitussin DM (plain only, alcohol free) Saline nasal spray/drops Sudafed (pseudoephedrine) & Actifed ** use only after [redacted] weeks gestation and if you do not have high blood pressure Tylenol Vicks Vaporub Zinc lozenges Zyrtec   Constipation: Colace Ducolax suppositories Fleet enema Glycerin suppositories Metamucil Milk of magnesia Miralax Senokot Smooth move tea  Diarrhea: Kaopectate Imodium A-D  *NO pepto Bismol  Hemorrhoids: Anusol Anusol HC Preparation H Tucks  Indigestion: Tums Maalox Mylanta Zantac  Pepcid  Insomnia: Benadryl (alcohol free) 25mg  every 6 hours as needed Tylenol  PM Unisom, no Gelcaps  Leg Cramps: Tums MagGel  Nausea/Vomiting:  Bonine Dramamine Emetrol Ginger extract Sea bands Meclizine  Nausea medication to take during pregnancy:  Unisom (doxylamine succinate 25 mg tablets) Take one tablet daily at bedtime. If symptoms are not adequately controlled, the dose can be increased to a maximum recommended dose of two tablets daily (1/2 tablet in the morning, 1/2 tablet mid-afternoon and one at bedtime). Vitamin B6 100mg  tablets. Take one tablet twice a day (up to 200 mg per day).  Skin Rashes: Aveeno products Benadryl cream or 25mg  every 6 hours as needed Calamine Lotion 1% cortisone cream  Yeast infection: Gyne-lotrimin 7 Monistat 7   **If taking multiple medications, please check labels to avoid duplicating the same active ingredients **take medication as directed on the label ** Do not exceed 4000 mg of tylenol in 24 hours **Do not take medications that contain aspirin or ibuprofen

## 2018-02-05 NOTE — MAU Note (Signed)
Urine in lab 

## 2018-02-05 NOTE — MAU Note (Signed)
Started cramping last night.  Bleeding noted this morning when she wiped

## 2018-02-05 NOTE — MAU Provider Note (Signed)
History     CSN: [id number]  Arrival date and time: 02/05/18 5188   First Provider Initiated Contact with Patient 02/05/18 [phone]      Chief Complaint  Patient presents with  . Vaginal Bleeding   Vaginal Bleeding  The patient's primary symptoms include pelvic pain and vaginal bleeding. This is a new problem. The current episode started yesterday. The problem occurs constantly. The problem has been gradually worsening. Pain severity now: 6/10. The problem affects the left side. She is pregnant. The vaginal discharge was bloody. The vaginal bleeding is spotting. She has not been passing clots. She has not been passing tissue. Nothing aggravates the symptoms. She has tried nothing for the symptoms. Contraceptive use: Was on Depo, last injection 07/2017. Stopped because it was "making me emotional" Her menstrual history has been irregular (LMP: 12/27/17, +UPT at the beginning of May).   Past Medical History:  Diagnosis Date  . Back pain    DDD  . History of migraine    last one 2 days ago  . Medical history non-contributory   . Urinary frequency     Past Surgical History:  Procedure Laterality Date  . CESAREAN SECTION     x 3   . CESAREAN SECTION N/A 08/11/2015   Procedure: REPEAT CESAREAN SECTION;  Surgeon: Truett Mainland, DO;  Location: [facility];  Service: Obstetrics;  Laterality: N/A;  . CHOLECYSTECTOMY N/A 03/21/2016   Procedure: LAPAROSCOPIC CHOLECYSTECTOMY;  Surgeon: Ralene Ok, MD;  Location: [facility];  Service: General;  Laterality: N/A;    Family History  Problem Relation Age of Onset  . Hypertension Mother   . Hypertension Father   . Heart disease Paternal Grandmother     Social History   Tobacco Use  . Smoking status: Never Smoker  . Smokeless tobacco: Never Used  Substance Use Topics  . Alcohol use: Not Currently    Comment: occaionally  . Drug use: No    Allergies: No Known Allergies  Facility-Administered Medications Prior to Admission  Medication Dose  Route Frequency Provider Last Rate Last Dose  . medroxyPROGESTERone (DEPO-PROVERA) injection 150 mg  150 mg Intramuscular Q90 days Kathrine Haddock N, CNM   150 mg at 12/31/16 1530   Medications Prior to Admission  Medication Sig Dispense Refill Last Dose  . oxyCODONE-acetaminophen (ROXICET) 5-325 MG tablet Take 1-2 tablets by mouth every 4 (four) hours as needed. (Patient not taking: Reported on 12/31/2016) 30 tablet 0 Not Taking    Review of Systems  Genitourinary: Positive for pelvic pain and vaginal bleeding.   Physical Exam   Blood pressure (!) 145/96, pulse 77, temperature 98.4 F (36.9 C), temperature source Oral, resp. rate 16, weight 203 lb 8 oz (92.3 kg), last menstrual period 12/27/2017.  Physical Exam  Nursing note and vitals reviewed. Constitutional: She is oriented to person, place, and time. She appears well-developed and well-nourished. No distress.  HENT:  Head: Normocephalic.  Cardiovascular: Normal rate.  Respiratory: Effort normal.  GI: Soft. There is no tenderness. There is no rebound.  Neurological: She is alert and oriented to person, place, and time.  Skin: Skin is warm and dry.  Psychiatric: She has a normal mood and affect.   Results for orders placed or performed during the hospital encounter of 02/05/18 (from the past 24 hour(s))  Wet prep, genital     Status: Abnormal   Collection Time: 02/05/18  9:08 AM  Result Value Ref Range   Yeast Wet Prep HPF POC NONE SEEN NONE  SEEN   Trich, Wet Prep NONE SEEN NONE SEEN   Clue Cells Wet Prep HPF POC PRESENT (A) NONE SEEN   WBC, Wet Prep HPF POC FEW (A) NONE SEEN   Sperm NONE SEEN   hCG, quantitative, pregnancy     Status: Abnormal   Collection Time: 02/05/18  9:15 AM  Result Value Ref Range   hCG, Beta Chain, Quant, S 24,367 (H) <5 mIU/mL  CBC     Status: Abnormal   Collection Time: 02/05/18  9:16 AM  Result Value Ref Range   WBC 9.5 4.0 - 10.5 K/uL   RBC 4.67 3.87 - 5.11 MIL/uL   Hemoglobin 12.3 12.0 -  15.0 g/dL   HCT 37.9 36.0 - 46.0 %   MCV 81.2 78.0 - 100.0 fL   MCH 26.3 26.0 - 34.0 pg   MCHC 32.5 30.0 - 36.0 g/dL   RDW 15.7 (H) 11.5 - 15.5 %   Platelets 489 (H) 150 - 400 K/uL   US Ob Comp Less 14 Wks  Result Date: 02/05/2018 CLINICAL DATA:  Vaginal bleeding EXAM: OBSTETRIC <14 WK [place] AND TRANSVAGINAL OB US TECHNIQUE: Both transabdominal and transvaginal ultrasound examinations were performed for complete evaluation of the gestation as well as the maternal uterus, adnexal regions, and pelvic cul-de-sac. Transvaginal technique was performed to assess early pregnancy. COMPARISON:  None. FINDINGS: Intrauterine gestational sac: Single Yolk sac:  Visualized Embryo:  Visualized Cardiac Activity: Visualized Heart Rate: 88 bpm MSD:   mm    w     d CRL:  2.35 mm   5 w   5 d                  [place] EDC: 10/03/2018 Subchorionic hemorrhage:  None visualized. Maternal uterus/adnexae: No adnexal mass or free fluid. IMPRESSION: Five week 5 day intrauterine pregnancy. Fetal bradycardia of 88 beats per minute which may be related to early gestational age. This could be followed with repeat ultrasound in 10-14 days to ensure expected progression. No acute maternal findings. Electronically Signed   By: Rolm Baptise M.D.   On: 02/05/2018 10:41   US Ob Transvaginal  Result Date: 02/05/2018 CLINICAL DATA:  Vaginal bleeding EXAM: OBSTETRIC <14 WK [place] AND TRANSVAGINAL OB US TECHNIQUE: Both transabdominal and transvaginal ultrasound examinations were performed for complete evaluation of the gestation as well as the maternal uterus, adnexal regions, and pelvic cul-de-sac. Transvaginal technique was performed to assess early pregnancy. COMPARISON:  None. FINDINGS: Intrauterine gestational sac: Single Yolk sac:  Visualized Embryo:  Visualized Cardiac Activity: Visualized Heart Rate: 88 bpm MSD:   mm    w     d CRL:  2.35 mm   5 w   5 d                  [place] EDC: 10/03/2018 Subchorionic hemorrhage:  None visualized. Maternal  uterus/adnexae: No adnexal mass or free fluid. IMPRESSION: Five week 5 day intrauterine pregnancy. Fetal bradycardia of 88 beats per minute which may be related to early gestational age. This could be followed with repeat ultrasound in 10-14 days to ensure expected progression. No acute maternal findings. Electronically Signed   By: Rolm Baptise M.D.   On: 02/05/2018 10:41    MAU Course  Procedures  MDM   Assessment and Plan   1. Vaginal bleeding in pregnancy, first trimester   2. [redacted] weeks gestation of pregnancy   3. Intrauterine pregnancy    DC home Comfort measures reviewed  1st Trimester precautions  Bleeding precautions RX: none  Return to MAU as needed FU with OB as planned  Follow-up Information    Department, [facility] Follow up.   Contact information: [place] [address] [phone]            Marcille Buffy 02/05/2018, 9:04 AM

## 2018-02-06 LAB — GC/CHLAMYDIA PROBE AMP (~~LOC~~) NOT AT ARMC
CHLAMYDIA, DNA PROBE: NEGATIVE
NEISSERIA GONORRHEA: NEGATIVE

## 2018-02-06 LAB — RPR: RPR Ser Ql: NONREACTIVE

## 2018-02-06 LAB — HIV ANTIBODY (ROUTINE TESTING W REFLEX): HIV SCREEN 4TH GENERATION: NONREACTIVE

## 2018-02-11 ENCOUNTER — Inpatient Hospital Stay (HOSPITAL_COMMUNITY): Payer: Medicaid Other

## 2018-02-11 ENCOUNTER — Inpatient Hospital Stay (HOSPITAL_COMMUNITY)
Admission: AD | Admit: 2018-02-11 | Discharge: 2018-02-11 | Disposition: A | Discharge status: Home / Self Care | Payer: Medicaid Other | Source: Ambulatory Visit | Attending: Obstetrics and Gynecology | Admitting: Obstetrics and Gynecology

## 2018-02-11 ENCOUNTER — Other Ambulatory Visit: Payer: Self-pay

## 2018-02-11 DIAGNOSIS — R109 Unspecified abdominal pain: Secondary | ICD-10-CM

## 2018-02-11 DIAGNOSIS — O26891 Other specified pregnancy related conditions, first trimester: Secondary | ICD-10-CM | POA: Diagnosis not present

## 2018-02-11 DIAGNOSIS — Z3A01 Less than 8 weeks gestation of pregnancy: Secondary | ICD-10-CM | POA: Diagnosis not present

## 2018-02-11 DIAGNOSIS — Z3491 Encounter for supervision of normal pregnancy, unspecified, first trimester: Secondary | ICD-10-CM

## 2018-02-11 DIAGNOSIS — O418X10 Other specified disorders of amniotic fluid and membranes, first trimester, not applicable or unspecified: Secondary | ICD-10-CM

## 2018-02-11 DIAGNOSIS — R1030 Lower abdominal pain, unspecified: Secondary | ICD-10-CM | POA: Diagnosis present

## 2018-02-11 DIAGNOSIS — O468X1 Other antepartum hemorrhage, first trimester: Secondary | ICD-10-CM

## 2018-02-11 DIAGNOSIS — O209 Hemorrhage in early pregnancy, unspecified: Secondary | ICD-10-CM | POA: Diagnosis not present

## 2018-02-11 NOTE — MAU Provider Note (Signed)
History     CSN: [id number]  Arrival date and time: 02/11/18 1407   First Provider Initiated Contact with Patient 02/11/18 1635      Chief Complaint  Patient presents with  . Vaginal Bleeding  . Abdominal Pain   HPI  Amy Kim is a 32 y.o. [id number] at [redacted]w[redacted]d who presents with lower abdominal cramping and vaginal bleeding. Had ultrasound last week that showed IUP with FHR 88.  Had an episode of spotting last week that resolved. Then started again last night. Reports pink spotting on toilet paper. Not bleeding into pad or passing clots. No recent intercourse. Also reports mild lower abdominal cramping that she rates 3/10. Has prenatal care scheduled with [facility] next month.   OB History    Gravida  5   Para  3   Term  3   Preterm  0   AB  1   Living  3     SAB  1   TAB  0   Ectopic  0   Multiple  0   Live Births  3           Past Medical History:  Diagnosis Date  . Back pain    DDD  . History of migraine    last one 2 days ago  . Medical history non-contributory   . Urinary frequency     Past Surgical History:  Procedure Laterality Date  . CESAREAN SECTION     x 3   . CESAREAN SECTION N/A 08/11/2015   Procedure: REPEAT CESAREAN SECTION;  Surgeon: Truett Mainland, DO;  Location: [facility];  Service: Obstetrics;  Laterality: N/A;  . CHOLECYSTECTOMY N/A 03/21/2016   Procedure: LAPAROSCOPIC CHOLECYSTECTOMY;  Surgeon: Ralene Ok, MD;  Location: [facility];  Service: General;  Laterality: N/A;    Family History  Problem Relation Age of Onset  . Hypertension Mother   . Hypertension Father   . Heart disease Paternal Grandmother     Social History   Tobacco Use  . Smoking status: Never Smoker  . Smokeless tobacco: Never Used  Substance Use Topics  . Alcohol use: Not Currently    Comment: occaionally  . Drug use: No    Allergies: No Known Allergies  No medications prior to admission.    Review of Systems  Constitutional: Negative.    Gastrointestinal: Positive for abdominal pain. Negative for constipation, diarrhea, nausea and vomiting.  Genitourinary: Positive for vaginal bleeding. Negative for dysuria and vaginal discharge.   Physical Exam   Blood pressure 130/86, pulse 84, temperature 99.5 F (37.5 C), temperature source Oral, resp. rate 17, weight 202 lb 8 oz (91.9 kg), last menstrual period 12/27/2017, SpO2 100 %.  Physical Exam  Nursing note and vitals reviewed. Constitutional: She is oriented to person, place, and time. She appears well-developed and well-nourished. No distress.  HENT:  Head: Normocephalic and atraumatic.  Eyes: Conjunctivae are normal. Right eye exhibits no discharge. Left eye exhibits no discharge. No scleral icterus.  Neck: Normal range of motion.  Respiratory: Effort normal. No respiratory distress.  Neurological: She is alert and oriented to person, place, and time.  Skin: She is not diaphoretic.  Psychiatric: She has a normal mood and affect. Her behavior is normal. Judgment and thought content normal.    MAU Course  Procedures No results found for this or any previous visit (from the past 24 hour(s)). US Ob Transvaginal  Result Date: 02/11/2018 CLINICAL DATA:  Bleeding in first trimester of pregnancy EXAM:  TRANSVAGINAL OB ULTRASOUND TECHNIQUE: Transvaginal ultrasound was performed for complete evaluation of the gestation as well as the maternal uterus, adnexal regions, and pelvic cul-de-sac. COMPARISON:  02/05/2018 FINDINGS: Intrauterine gestational sac: Present, single Yolk sac:  Present Embryo:  Present Cardiac Activity: Present Heart Rate: 132 bpm CRL:   8.0 mm   6 w 5 d                  [place] EDC: 10/02/2018 Subchorionic hemorrhage:  Small subchronic hemorrhage Maternal uterus/adnexae: Uterus otherwise normal morphology without mass Ovaries normal appearance. No adnexal masses. Trace free pelvic fluid. IMPRESSION: Single live intrauterine gestation at 6 weeks 5 days EGA by crown-rump  length. Small subchronic hemorrhage. Electronically Signed   By: Lavonia Dana M.D.   On: 02/11/2018 15:52    MDM B positive  Ultrasound shows SIUP with FHR of 132 & small [place]  Assessment and Plan  A:  1. Normal IUP (intrauterine pregnancy) on prenatal ultrasound, first trimester   2. Bleeding in early pregnancy   3. Subchorionic hematoma in first trimester, single or unspecified fetus    P: Discharge home Pelvic rest Discussed reasons to return to MAU Keep scheduled ob appt  Jorje Guild 02/11/2018, 4:35 PM

## 2018-02-11 NOTE — Discharge Instructions (Signed)
Subchorionic Hematoma °A subchorionic hematoma is a gathering of blood between the outer wall of the placenta and the inner wall of the womb (uterus). The placenta is the organ that connects the fetus to the wall of the uterus. The placenta performs the feeding, breathing (oxygen to the fetus), and waste removal (excretory work) of the fetus. °Subchorionic hematoma is the most common abnormality found on a result from ultrasonography done during the first trimester or early second trimester of pregnancy. If there has been little or no vaginal bleeding, early small hematomas usually shrink on their own and do not affect your baby or pregnancy. The blood is gradually absorbed over 1-2 weeks. When bleeding starts later in pregnancy or the hematoma is larger or occurs in an older pregnant woman, the outcome may not be as good. Larger hematomas may get bigger, which increases the chances for miscarriage. Subchorionic hematoma also increases the risk of premature detachment of the placenta from the uterus, preterm (premature) labor, and stillbirth. °Follow these instructions at home: °· Stay on bed rest if your health care provider recommends this. Although bed rest will not prevent more bleeding or prevent a miscarriage, your health care provider may recommend bed rest until you are advised otherwise. °· Avoid heavy lifting (more than 10 lb [4.5 kg]), exercise, sexual intercourse, or douching as directed by your health care provider. °· Keep track of the number of pads you use each day and how soaked (saturated) they are. Write down this information. °· Do not use tampons. °· Keep all follow-up appointments as directed by your health care provider. Your health care provider may ask you to have follow-up blood tests or ultrasound tests or both. °Get help right away if: °· You have severe cramps in your stomach, back, abdomen, or pelvis. °· You have a fever. °· You pass large clots or tissue. Save any tissue for your  health care provider to look at. °· Your bleeding increases or you become lightheaded, feel weak, or have fainting episodes. °This information is not intended to replace advice given to you by your health care provider. Make sure you discuss any questions you have with your health care provider. °Document Released: 12/26/2006 Document Revised: 02/16/2016 Document Reviewed: 04/09/2013 °Elsevier Interactive Patient Education © 2017 Elsevier Inc. ° °

## 2018-02-11 NOTE — MAU Note (Signed)
Had some bleeding last Wed, went away,  Started again yesterday.  Continues today.  Called the clinic, told to come in. Cramping.

## 2018-03-20 ENCOUNTER — Encounter: Payer: Self-pay | Admitting: Obstetrics and Gynecology

## 2018-03-20 ENCOUNTER — Ambulatory Visit (INDEPENDENT_AMBULATORY_CARE_PROVIDER_SITE_OTHER): Payer: Medicaid Other | Admitting: Obstetrics and Gynecology

## 2018-03-20 ENCOUNTER — Other Ambulatory Visit (HOSPITAL_COMMUNITY)
Admission: RE | Admit: 2018-03-20 | Discharge: 2018-03-20 | Disposition: A | Discharge status: Home / Self Care | Payer: Medicaid Other | Source: Ambulatory Visit | Attending: Obstetrics and Gynecology | Admitting: Obstetrics and Gynecology

## 2018-03-20 DIAGNOSIS — Z3480 Encounter for supervision of other normal pregnancy, unspecified trimester: Secondary | ICD-10-CM | POA: Diagnosis present

## 2018-03-20 DIAGNOSIS — Z3481 Encounter for supervision of other normal pregnancy, first trimester: Secondary | ICD-10-CM

## 2018-03-20 DIAGNOSIS — Z8759 Personal history of other complications of pregnancy, childbirth and the puerperium: Secondary | ICD-10-CM

## 2018-03-20 NOTE — Progress Notes (Signed)
  Subjective:    Amy Kim is being seen today for her first obstetrical visit.  This is a planned pregnancy. She is at [redacted]w[redacted]d gestation. Her obstetrical history is significant for obesity and H/O C/S x 3 and H/O gHTN. Relationship with FOB: spouse, living together. Patient does intend to breast feed. Pregnancy history fully reviewed.  Patient reports nausea, but does not require any medication at this time.  Review of Systems:   Review of Systems  Constitutional: Negative.   HENT: Negative.   Eyes: Negative.   Respiratory: Negative.   Cardiovascular: Negative.   Gastrointestinal: Positive for nausea.  Endocrine: Negative.   Genitourinary: Negative.   Musculoskeletal: Negative.   Skin: Negative.   Allergic/Immunologic: Negative.   Neurological: Negative.   Hematological: Negative.   Psychiatric/Behavioral: Negative.     Objective:     BP 111/77   Pulse 98   Wt 197 lb 3.2 oz (89.4 kg)   LMP 12/27/2017 (Exact Date)   BMI 37.26 kg/m  Physical Exam  Nursing note and vitals reviewed. Constitutional: She is oriented to person, place, and time. She appears well-developed and well-nourished.  HENT:  Head: Normocephalic and atraumatic.  Right Ear: External ear normal.  Left Ear: External ear normal.  Nose: Nose normal.  Mouth/Throat: Oropharynx is clear and moist.  Eyes: Pupils are equal, round, and reactive to light. Conjunctivae and EOM are normal.  Neck: Normal range of motion. Neck supple.  Cardiovascular: Normal rate, regular rhythm, normal heart sounds and intact distal pulses.  Respiratory: Effort normal and breath sounds normal.  GI: Soft. Bowel sounds are normal.  Genitourinary: Uterus normal. Vaginal discharge (moderate amount of yellowish-white, WP, GC/CT done) found.  Musculoskeletal: Normal range of motion.  Neurological: She is alert and oriented to person, place, and time. She has normal reflexes.  Skin: Skin is warm and dry.  Psychiatric: She has a  normal mood and affect. Her behavior is normal. Judgment and thought content normal.    Maternal Exam:  Abdomen: Patient reports no abdominal tenderness. Surgical scars: low transverse.   Introitus: Normal vulva. Vagina is positive for vaginal discharge (moderate amount of yellowish-white, WP, GC/CT done).  Ferning test: not done.  Nitrazine test: not done. Amniotic fluid character: not assessed.  Pelvis: adequate for delivery.   Cervix: Cervix evaluated by sterile speculum exam and digital exam.     Fetal Exam Fetal Monitor Review: Mode: hand-held doppler probe.   Baseline rate: 156.         Assessment:    Pregnancy: [id number] Patient Active Problem List   Diagnosis Date Noted  . Supervision of other normal pregnancy, antepartum 03/20/2018  . S/P repeat low transverse C-section 08/11/2015  . Obesity complicating pregnancy, childbirth, or puerperium, antepartum 05/23/2015  . History of gestational hypertension 04/19/2015  . Abdominal cyst 03/29/2015  . History of C-section 03/24/2015       Plan:     Initial labs and Panorama drawn. Prenatal vitamins. Problem list reviewed and updated. AFP3 discussed: undecided. Role of ultrasound in pregnancy discussed; fetal survey: NT ordered. Amniocentesis discussed: not indicated. Follow up in 4 weeks. 100% of 45 min visit spent on counseling and coordination of care.     Laury Deep, CNM 03/20/2018

## 2018-03-20 NOTE — Progress Notes (Signed)
ce

## 2018-03-21 ENCOUNTER — Encounter: Payer: Self-pay | Admitting: General Practice

## 2018-03-21 ENCOUNTER — Other Ambulatory Visit: Payer: Self-pay | Admitting: Obstetrics and Gynecology

## 2018-03-21 LAB — CBC
HEMOGLOBIN: 12.1 g/dL (ref 11.1–15.9)
Hematocrit: 37.4 % (ref 34.0–46.6)
MCH: 26 pg — ABNORMAL LOW (ref 26.6–33.0)
MCHC: 32.4 g/dL (ref 31.5–35.7)
MCV: 80 fL (ref 79–97)
Platelets: 330 10*3/uL (ref 150–450)
RBC: 4.65 x10E6/uL (ref 3.77–5.28)
RDW: 15.8 % — ABNORMAL HIGH (ref 12.3–15.4)
WBC: 8.6 10*3/uL (ref 3.4–10.8)

## 2018-03-21 LAB — COMPREHENSIVE METABOLIC PANEL
ALBUMIN: 3.8 g/dL (ref 3.5–5.5)
ALT: 20 IU/L (ref 0–32)
AST: 16 IU/L (ref 0–40)
Albumin/Globulin Ratio: 1.2 (ref 1.2–2.2)
Alkaline Phosphatase: 68 IU/L (ref 39–117)
BUN / CREAT RATIO: 20 (ref 9–23)
BUN: 11 mg/dL (ref 6–20)
Bilirubin Total: 0.2 mg/dL (ref 0.0–1.2)
CALCIUM: 9.6 mg/dL (ref 8.7–10.2)
CO2: 19 mmol/L — AB (ref 20–29)
CREATININE: 0.54 mg/dL — AB (ref 0.57–1.00)
Chloride: 105 mmol/L (ref 96–106)
GFR, EST AFRICAN AMERICAN: 145 mL/min/{1.73_m2} (ref >59)
GFR, EST NON AFRICAN AMERICAN: 126 mL/min/{1.73_m2} (ref >59)
GLUCOSE: 68 mg/dL (ref 65–99)
Globulin, Total: 3.3 g/dL (ref 1.5–4.5)
Potassium: 4.2 mmol/L (ref 3.5–5.2)
Sodium: 139 mmol/L (ref 134–144)
TOTAL PROTEIN: 7.1 g/dL (ref 6.0–8.5)

## 2018-03-21 LAB — CERVICOVAGINAL ANCILLARY ONLY
Bacterial vaginitis: POSITIVE — AB
Candida vaginitis: NEGATIVE
Chlamydia: NEGATIVE
NEISSERIA GONORRHEA: NEGATIVE
TRICH (WINDOWPATH): NEGATIVE

## 2018-03-22 ENCOUNTER — Other Ambulatory Visit: Payer: Self-pay | Admitting: Obstetrics and Gynecology

## 2018-03-22 DIAGNOSIS — B9689 Other specified bacterial agents as the cause of diseases classified elsewhere: Secondary | ICD-10-CM

## 2018-03-22 DIAGNOSIS — N760 Acute vaginitis: Principal | ICD-10-CM

## 2018-03-22 MED ORDER — METRONIDAZOLE 500 MG PO TABS: 500.0000 mg | ORAL_TABLET | Freq: Two times a day (BID) | ORAL | 0 refills | Status: DC

## 2018-03-22 NOTE — Progress Notes (Signed)
TC to notify patient of BV dx. Advised that Rx is needed to treat. Pharmacy of choice verified. Patient advised to take med with food, to take as prescribed and to complete course.  Patient verbalized an understanding of the plan of care and agrees.    Rx for Flagyl 500 mg po BID x 7 days sent to [facility] on [address] Letta Kocher, [place], [place]  03/22/2018 11:43 AM

## 2018-03-23 LAB — CULTURE, OB URINE

## 2018-03-23 LAB — URINE CULTURE, OB REFLEX

## 2018-03-25 ENCOUNTER — Encounter (HOSPITAL_COMMUNITY): Payer: Self-pay

## 2018-03-28 LAB — SMN1 COPY NUMBER ANALYSIS (SMA CARRIER SCREENING)

## 2018-03-28 LAB — HEMOGLOBINOPATHY EVALUATION
HEMOGLOBIN A2 QUANTITATION: 2.3 % (ref 1.8–3.2)
HGB A: 97.7 % (ref 96.4–98.8)
HGB C: 0 %
HGB S: 0 %
HGB VARIANT: 0 %
Hemoglobin F Quantitation: 0 % (ref 0.0–2.0)

## 2018-03-28 LAB — OBSTETRIC PANEL, INCLUDING HIV
ANTIBODY SCREEN: NEGATIVE
BASOS: 0 %
Basophils Absolute: 0 10*3/uL (ref 0.0–0.2)
EOS (ABSOLUTE): 0 10*3/uL (ref 0.0–0.4)
EOS: 0 %
HEMATOCRIT: 37.3 % (ref 34.0–46.6)
HEMOGLOBIN: 12.1 g/dL (ref 11.1–15.9)
HEP B S AG: NEGATIVE
HIV Screen 4th Generation wRfx: NONREACTIVE
IMMATURE GRANS (ABS): 0 10*3/uL (ref 0.0–0.1)
IMMATURE GRANULOCYTES: 0 %
LYMPHS: 20 %
Lymphocytes Absolute: 1.8 10*3/uL (ref 0.7–3.1)
MCH: 26.5 pg — ABNORMAL LOW (ref 26.6–33.0)
MCHC: 32.4 g/dL (ref 31.5–35.7)
MCV: 82 fL (ref 79–97)
MONOCYTES: 5 %
Monocytes Absolute: 0.4 10*3/uL (ref 0.1–0.9)
NEUTROS PCT: 75 %
Neutrophils Absolute: 6.8 10*3/uL (ref 1.4–7.0)
Platelets: 463 10*3/uL — ABNORMAL HIGH (ref 150–450)
RBC: 4.56 x10E6/uL (ref 3.77–5.28)
RDW: 16 % — ABNORMAL HIGH (ref 12.3–15.4)
RPR: NONREACTIVE
Rh Factor: POSITIVE
Rubella Antibodies, IGG: 1.44 index (ref >0.99)
WBC: 9.1 10*3/uL (ref 3.4–10.8)

## 2018-03-28 LAB — CYSTIC FIBROSIS MUTATION 97: GENE DIS ANAL CARRIER INTERP BLD/T-IMP: NOT DETECTED

## 2018-03-28 LAB — PROTEIN, URINE, 24 HOUR
PROTEIN 24H UR: 524 mg/(24.h) — AB (ref 30–150)
PROTEIN UR: 58.2 mg/dL

## 2018-04-02 ENCOUNTER — Other Ambulatory Visit: Payer: Self-pay | Admitting: Obstetrics and Gynecology

## 2018-04-02 ENCOUNTER — Ambulatory Visit (HOSPITAL_COMMUNITY): Admission: RE | Admit: 2018-04-02 | Payer: Medicaid Other | Source: Ambulatory Visit

## 2018-04-02 ENCOUNTER — Encounter (HOSPITAL_COMMUNITY): Payer: Self-pay

## 2018-04-02 ENCOUNTER — Ambulatory Visit (HOSPITAL_COMMUNITY)
Admission: RE | Admit: 2018-04-02 | Discharge: 2018-04-02 | Disposition: A | Discharge status: Home / Self Care | Payer: Medicaid Other | Source: Ambulatory Visit | Attending: Obstetrics and Gynecology | Admitting: Obstetrics and Gynecology

## 2018-04-02 DIAGNOSIS — Z3682 Encounter for antenatal screening for nuchal translucency: Secondary | ICD-10-CM | POA: Diagnosis present

## 2018-04-02 DIAGNOSIS — O99212 Obesity complicating pregnancy, second trimester: Secondary | ICD-10-CM

## 2018-04-02 DIAGNOSIS — O99211 Obesity complicating pregnancy, first trimester: Secondary | ICD-10-CM | POA: Diagnosis present

## 2018-04-02 DIAGNOSIS — Z3A13 13 weeks gestation of pregnancy: Secondary | ICD-10-CM

## 2018-04-02 DIAGNOSIS — O34219 Maternal care for unspecified type scar from previous cesarean delivery: Secondary | ICD-10-CM

## 2018-04-02 DIAGNOSIS — Z3480 Encounter for supervision of other normal pregnancy, unspecified trimester: Secondary | ICD-10-CM

## 2018-04-17 ENCOUNTER — Encounter: Payer: Self-pay | Admitting: Obstetrics and Gynecology

## 2018-04-17 ENCOUNTER — Ambulatory Visit (INDEPENDENT_AMBULATORY_CARE_PROVIDER_SITE_OTHER): Payer: Medicaid Other | Admitting: Obstetrics and Gynecology

## 2018-04-17 VITALS — BP 92/66 | HR 83 | Wt 196.0 lb

## 2018-04-17 DIAGNOSIS — Z3480 Encounter for supervision of other normal pregnancy, unspecified trimester: Secondary | ICD-10-CM

## 2018-04-17 DIAGNOSIS — O99212 Obesity complicating pregnancy, second trimester: Secondary | ICD-10-CM

## 2018-04-17 DIAGNOSIS — O34219 Maternal care for unspecified type scar from previous cesarean delivery: Secondary | ICD-10-CM | POA: Insufficient documentation

## 2018-04-17 DIAGNOSIS — E669 Obesity, unspecified: Secondary | ICD-10-CM

## 2018-04-17 NOTE — Progress Notes (Signed)
Pt states some mild cramping, denies bleeding.

## 2018-04-17 NOTE — Progress Notes (Signed)
   PRENATAL VISIT NOTE  Subjective:  Amy Kim is a 32 y.o. [id number] at [redacted]w[redacted]d being seen today for ongoing prenatal care.  She is currently monitored for the following issues for this high-risk pregnancy and has History of C-section; Abdominal cyst; History of gestational hypertension; S/P repeat low transverse C-section; Supervision of other normal pregnancy, antepartum; Obesity complicating pregnancy, childbirth, or puerperium, antepartum; and Previous cesarean delivery affecting pregnancy, antepartum on their problem list.  Patient reports no complaints.  Contractions: Not present. Vag. Bleeding: None.  Movement: Present. Denies leaking of fluid.   The following portions of the patient's history were reviewed and updated as appropriate: allergies, current medications, past family history, past medical history, past social history, past surgical history and problem list. Problem list updated.  Objective:   Vitals:   04/17/18 0924  BP: 92/66  Pulse: 83  Weight: 196 lb (88.9 kg)    Fetal Status: Fetal Heart Rate (bpm): 150 Fundal Height: 16 cm Movement: Present     General:  Alert, oriented and cooperative. Patient is in no acute distress.  Skin: Skin is warm and dry. No rash noted.   Cardiovascular: Normal heart rate noted  Respiratory: Normal respiratory effort, no problems with respiration noted  Abdomen: Soft, gravid, appropriate for gestational age.  Pain/Pressure: Absent     Pelvic: Cervical exam deferred        Extremities: Normal range of motion.     Mental Status: Normal mood and affect. Normal behavior. Normal judgment and thought content.   Assessment and Plan:  Pregnancy: [id number] at [redacted]w[redacted]d  1. Supervision of other normal pregnancy, antepartum - [place] MFM OB COMP + 14 WK; Future - Discussed normal u/s results -- no NT, but reassurance given that normal NIPS an upcoming anatomy U/S will confirm  2. Previous cesarean delivery affecting pregnancy, antepartum - Desires  TOLAC -- will have to sign consent  3. Obesity affecting pregnancy in second trimester - Glucose tolerance, 2 hours; Future  Preterm labor symptoms and general obstetric precautions including but not limited to vaginal bleeding, contractions, leaking of fluid and fetal movement were reviewed in detail with the patient. Please refer to After Visit Summary for other counseling recommendations.  Return in about 1 month (around 05/15/2018) for Return OB 2hr GTT.  Future Appointments  Date Time Provider [place]  05/09/2018  7:45 AM [facility] [place] 2 WH-MFCUS MFC-US  05/15/2018  8:30 AM [place] [facility] None  05/15/2018  9:10 AM Laury Deep, CNM [facility] None    Laury Deep, CNM

## 2018-04-17 NOTE — Progress Notes (Deleted)
   PRENATAL VISIT NOTE  Subjective:  Amy Kim is a 32 y.o. P5P0051 at [redacted]w[redacted]d being seen today for ongoing prenatal care.  She is currently monitored for the following issues for this {Blank single:19197::"high-risk","low-risk"} pregnancy and has History of C-section; Abdominal cyst; History of gestational hypertension; S/P repeat low transverse C-section; Supervision of other normal pregnancy, antepartum; Obesity complicating pregnancy, childbirth, or puerperium, antepartum; and Previous cesarean delivery affecting pregnancy, antepartum on their problem list.  Patient reports {sx:14538}.  Contractions: Not present. Vag. Bleeding: None.  Movement: Present. Denies leaking of fluid.   The following portions of the patient's history were reviewed and updated as appropriate: allergies, current medications, past family history, past medical history, past social history, past surgical history and problem list. Problem list updated.  Objective:   Vitals:   04/17/18 0924  BP: 92/66  Pulse: 83  Weight: 196 lb (88.9 kg)    Fetal Status: Fetal Heart Rate (bpm): 150   Movement: Present     General:  Alert, oriented and cooperative. Patient is in no acute distress.  Skin: Skin is warm and dry. No rash noted.   Cardiovascular: Normal heart rate noted  Respiratory: Normal respiratory effort, no problems with respiration noted  Abdomen: Soft, gravid, appropriate for gestational age.  Pain/Pressure: Absent     Pelvic: {Blank single:19197::"Cervical exam performed","Cervical exam deferred"}        Extremities: Normal range of motion.     Mental Status: Normal mood and affect. Normal behavior. Normal judgment and thought content.   Assessment and Plan:  Pregnancy: T0Y1117 at [redacted]w[redacted]d  1. Supervision of other normal pregnancy, antepartum *** - [place] MFM OB COMP + 14 WK; Future  2. Previous cesarean delivery affecting pregnancy, antepartum ***  3. Obesity affecting pregnancy in second  trimester *** - Glucose tolerance, 2 hours; Future  {Blank single:19197::"Term","Preterm"} labor symptoms and general obstetric precautions including but not limited to vaginal bleeding, contractions, leaking of fluid and fetal movement were reviewed in detail with the patient. Please refer to After Visit Summary for other counseling recommendations.  No follow-ups on file.  Future Appointments  Date Time Provider [place]  05/09/2018  7:45 AM [facility] [place] 2 [facility]-M[facility]US M[facility]-US  05/15/2018  8:30 AM [place] [facility] None  05/15/2018  9:10 AM Laury Deep, CNM [facility] None    Laury Deep, CNM

## 2018-05-09 ENCOUNTER — Ambulatory Visit (HOSPITAL_COMMUNITY)
Admission: RE | Admit: 2018-05-09 | Discharge: 2018-05-09 | Disposition: A | Discharge status: Home / Self Care | Payer: Medicaid Other | Source: Ambulatory Visit | Attending: Obstetrics and Gynecology | Admitting: Obstetrics and Gynecology

## 2018-05-09 ENCOUNTER — Other Ambulatory Visit: Payer: Self-pay | Admitting: Obstetrics and Gynecology

## 2018-05-09 ENCOUNTER — Other Ambulatory Visit (HOSPITAL_COMMUNITY): Payer: Self-pay | Admitting: *Deleted

## 2018-05-09 DIAGNOSIS — Z3480 Encounter for supervision of other normal pregnancy, unspecified trimester: Secondary | ICD-10-CM

## 2018-05-09 DIAGNOSIS — O99212 Obesity complicating pregnancy, second trimester: Secondary | ICD-10-CM

## 2018-05-09 DIAGNOSIS — O34219 Maternal care for unspecified type scar from previous cesarean delivery: Secondary | ICD-10-CM | POA: Diagnosis present

## 2018-05-09 DIAGNOSIS — O09892 Supervision of other high risk pregnancies, second trimester: Secondary | ICD-10-CM

## 2018-05-09 DIAGNOSIS — Z3A19 19 weeks gestation of pregnancy: Secondary | ICD-10-CM | POA: Diagnosis not present

## 2018-05-09 DIAGNOSIS — Z363 Encounter for antenatal screening for malformations: Secondary | ICD-10-CM | POA: Diagnosis not present

## 2018-05-09 DIAGNOSIS — O09293 Supervision of pregnancy with other poor reproductive or obstetric history, third trimester: Secondary | ICD-10-CM | POA: Diagnosis not present

## 2018-05-09 DIAGNOSIS — Z362 Encounter for other antenatal screening follow-up: Secondary | ICD-10-CM

## 2018-05-15 ENCOUNTER — Other Ambulatory Visit: Payer: Medicaid Other

## 2018-05-15 ENCOUNTER — Other Ambulatory Visit: Payer: Self-pay

## 2018-05-15 ENCOUNTER — Ambulatory Visit (INDEPENDENT_AMBULATORY_CARE_PROVIDER_SITE_OTHER): Payer: Medicaid Other | Admitting: Obstetrics and Gynecology

## 2018-05-15 VITALS — BP 106/71 | HR 83 | Wt 196.0 lb

## 2018-05-15 DIAGNOSIS — R51 Headache: Secondary | ICD-10-CM

## 2018-05-15 DIAGNOSIS — R519 Headache, unspecified: Secondary | ICD-10-CM

## 2018-05-15 DIAGNOSIS — Z3480 Encounter for supervision of other normal pregnancy, unspecified trimester: Secondary | ICD-10-CM

## 2018-05-15 DIAGNOSIS — O26892 Other specified pregnancy related conditions, second trimester: Secondary | ICD-10-CM

## 2018-05-15 DIAGNOSIS — O34219 Maternal care for unspecified type scar from previous cesarean delivery: Secondary | ICD-10-CM

## 2018-05-15 DIAGNOSIS — O99212 Obesity complicating pregnancy, second trimester: Secondary | ICD-10-CM

## 2018-05-15 MED ORDER — ACETAMINOPHEN 500 MG PO TABS
1000.0000 mg | ORAL_TABLET | Freq: Once | ORAL | Status: AC
  Administered 2018-05-15: 1000 mg via ORAL

## 2018-05-15 NOTE — Progress Notes (Signed)
   PRENATAL VISIT NOTE  Subjective:  Amy Kim is a 32 y.o. T5H7416 at [redacted]w[redacted]d being seen today for ongoing prenatal care.  She is currently monitored for the following issues for this high-risk pregnancy and has History of C-section; Abdominal cyst; History of gestational hypertension; S/P repeat low transverse C-section; Supervision of other normal pregnancy, antepartum; Obesity complicating pregnancy, childbirth, or puerperium, antepartum; and Previous cesarean delivery affecting pregnancy, antepartum on their problem list.  Patient reports headache and heartburn.  Contractions: Not present. Vag. Bleeding: None.  Movement: Present. Denies leaking of fluid.   The following portions of the patient's history were reviewed and updated as appropriate: allergies, current medications, past family history, past medical history, past social history, past surgical history and problem list. Problem list updated.  Objective:   Vitals:   05/15/18 0905  BP: 106/71  Pulse: 83  Weight: 196 lb (88.9 kg)    Fetal Status: Fetal Heart Rate (bpm): 146 Fundal Height: 20 cm Movement: Present     General:  Alert, oriented and cooperative. Patient is in no acute distress.  Skin: Skin is warm and dry. No rash noted.   Cardiovascular: Normal heart rate noted  Respiratory: Normal respiratory effort, no problems with respiration noted  Abdomen: Soft, gravid, appropriate for gestational age.  Pain/Pressure: Absent     Pelvic: Cervical exam deferred        Extremities: Normal range of motion.  Edema: None  Mental Status: Normal mood and affect. Normal behavior. Normal judgment and thought content.   Assessment and Plan:  Pregnancy: [id number] at [redacted]w[redacted]d  1. Headache in pregnancy, second trimester - Consult with Dr. Rip Harbour @ [phone] - notified of patient's complaints, assessments, lab results, recommended tx plan send home after 2 hr GTT, advise to get something to eat & go to MAU if no improvement 2 hrs after  eating and resting - Acetaminophen (TYLENOL) tablet 1,000 mg -- minimally improved, H/A went from 7/10  - Patient verbalized an understanding of the plan of care and agrees.   Preterm labor symptoms and general obstetric precautions including but not limited to vaginal bleeding, contractions, leaking of fluid and fetal movement were reviewed in detail with the patient. Please refer to After Visit Summary for other counseling recommendations.  Return in about 4 weeks (around 06/12/2018) for Return OB visit.  Future Appointments  Date Time Provider [place]  06/12/2018  8:50 AM Laury Deep, CNM [facility] None  06/13/2018  8:15 AM [facility] [place] 4 WH-MFCUS MFC-US    Laury Deep, CNM

## 2018-05-15 NOTE — Patient Instructions (Signed)
Second Trimester of Pregnancy The second trimester is from week 13 through week 28, month 4 through 6. This is often the time in pregnancy that you feel your best. Often times, morning sickness has lessened or quit. You may have more energy, and you may get hungry more often. Your unborn baby (fetus) is growing rapidly. At the end of the sixth month, he or she is about 9 inches long and weighs about 1 pounds. You will likely feel the baby move (quickening) between 18 and 20 weeks of pregnancy. Follow these instructions at home:  Avoid all smoking, herbs, and alcohol. Avoid drugs not approved by your doctor.  Do not use any tobacco products, including cigarettes, chewing tobacco, and electronic cigarettes. If you need help quitting, ask your doctor. You may get counseling or other support to help you quit.  Only take medicine as told by your doctor. Some medicines are safe and some are not during pregnancy.  Exercise only as told by your doctor. Stop exercising if you start having cramps.  Eat regular, healthy meals.  Wear a good support bra if your breasts are tender.  Do not use hot tubs, steam rooms, or saunas.  Wear your seat belt when driving.  Avoid raw meat, uncooked cheese, and liter boxes and soil used by cats.  Take your prenatal vitamins.  Take 1500-2000 milligrams of calcium daily starting at the 20th week of pregnancy until you deliver your baby.  Try taking medicine that helps you poop (stool softener) as needed, and if your doctor approves. Eat more fiber by eating fresh fruit, vegetables, and whole grains. Drink enough fluids to keep your pee (urine) clear or pale yellow.  Take warm water baths (sitz baths) to soothe pain or discomfort caused by hemorrhoids. Use hemorrhoid cream if your doctor approves.  If you have puffy, bulging veins (varicose veins), wear support hose. Raise (elevate) your feet for 15 minutes, 3-4 times a day. Limit salt in your diet.  Avoid heavy  lifting, wear low heals, and sit up straight.  Rest with your legs raised if you have leg cramps or low back pain.  Visit your dentist if you have not gone during your pregnancy. Use a soft toothbrush to brush your teeth. Be gentle when you floss.  You can have sex (intercourse) unless your doctor tells you not to.  Go to your doctor visits. Get help if:  You feel dizzy.  You have mild cramps or pressure in your lower belly (abdomen).  You have a nagging pain in your belly area.  You continue to feel sick to your stomach (nauseous), throw up (vomit), or have watery poop (diarrhea).  You have bad smelling fluid coming from your vagina.  You have pain with peeing (urination). Get help right away if:  You have a fever.  You are leaking fluid from your vagina.  You have spotting or bleeding from your vagina.  You have severe belly cramping or pain.  You lose or gain weight rapidly.  You have trouble catching your breath and have chest pain.  You notice sudden or extreme puffiness (swelling) of your face, hands, ankles, feet, or legs.  You have not felt the baby move in over an hour.  You have severe headaches that do not go away with medicine.  You have vision changes. This information is not intended to replace advice given to you by your health care provider. Make sure you discuss any questions you have with your health care   provider. Document Released: 12/05/2009 Document Revised: 02/16/2016 Document Reviewed: 11/11/2012 Elsevier Interactive Patient Education  2017 Elsevier Inc.  

## 2018-05-15 NOTE — Progress Notes (Deleted)
   PRENATAL VISIT NOTE  Subjective:  Amy Kim is a 32 y.o. O1B5102 at [redacted]w[redacted]d being seen today for ongoing prenatal care.  She is currently monitored for the following issues for this {Blank single:19197::"high-risk","low-risk"} pregnancy and has History of C-section; Abdominal cyst; History of gestational hypertension; S/P repeat low transverse C-section; Supervision of other normal pregnancy, antepartum; Obesity complicating pregnancy, childbirth, or puerperium, antepartum; and Previous cesarean delivery affecting pregnancy, antepartum on their problem list.  Patient reports {sx:14538}.  Contractions: Not present. Vag. Bleeding: None.  Movement: Present. Denies leaking of fluid.   The following portions of the patient's history were reviewed and updated as appropriate: allergies, current medications, past family history, past medical history, past social history, past surgical history and problem list. Problem list updated.  Objective:   Vitals:   05/15/18 0905  BP: 106/71  Pulse: 83  Weight: 196 lb (88.9 kg)    Fetal Status: Fetal Heart Rate (bpm): 146   Movement: Present     General:  Alert, oriented and cooperative. Patient is in no acute distress.  Skin: Skin is warm and dry. No rash noted.   Cardiovascular: Normal heart rate noted  Respiratory: Normal respiratory effort, no problems with respiration noted  Abdomen: Soft, gravid, appropriate for gestational age.  Pain/Pressure: Absent     Pelvic: {Blank single:19197::"Cervical exam performed","Cervical exam deferred"}        Extremities: Normal range of motion.  Edema: None  Mental Status: Normal mood and affect. Normal behavior. Normal judgment and thought content.   Assessment and Plan:  Pregnancy: H8N2778 at [redacted]w[redacted]d  1. Headache in pregnancy, second trimester *** - acetaminophen (TYLENOL) tablet 1,000 mg  {Blank single:19197::"Term","Preterm"} labor symptoms and general obstetric precautions including but not limited  to vaginal bleeding, contractions, leaking of fluid and fetal movement were reviewed in detail with the patient. Please refer to After Visit Summary for other counseling recommendations.  No follow-ups on file.  Future Appointments  Date Time Provider [place]  06/13/2018  8:15 AM [facility] [place] Jerrell Hart, CNM

## 2018-05-16 LAB — GLUCOSE TOLERANCE, 2 HOURS
Glucose, 2 hour: 123 mg/dL (ref 65–139)
Glucose, GTT - Fasting: 81 mg/dL (ref 65–99)

## 2018-06-12 ENCOUNTER — Ambulatory Visit (INDEPENDENT_AMBULATORY_CARE_PROVIDER_SITE_OTHER): Payer: Medicaid Other | Admitting: Obstetrics and Gynecology

## 2018-06-12 VITALS — BP 108/72 | HR 92 | Wt 202.0 lb

## 2018-06-12 DIAGNOSIS — Z23 Encounter for immunization: Secondary | ICD-10-CM

## 2018-06-12 DIAGNOSIS — Z3482 Encounter for supervision of other normal pregnancy, second trimester: Secondary | ICD-10-CM

## 2018-06-12 DIAGNOSIS — Z98891 History of uterine scar from previous surgery: Secondary | ICD-10-CM

## 2018-06-12 DIAGNOSIS — O4692 Antepartum hemorrhage, unspecified, second trimester: Secondary | ICD-10-CM

## 2018-06-12 DIAGNOSIS — O34219 Maternal care for unspecified type scar from previous cesarean delivery: Secondary | ICD-10-CM

## 2018-06-12 DIAGNOSIS — Z3480 Encounter for supervision of other normal pregnancy, unspecified trimester: Secondary | ICD-10-CM

## 2018-06-12 NOTE — Progress Notes (Signed)
   PRENATAL VISIT NOTE  Subjective:  Amy Kim is a 32 y.o. D3O6712 at [redacted]w[redacted]d being seen today for ongoing prenatal care.  She is currently monitored for the following issues for this low-risk pregnancy and has History of C-section; Abdominal cyst; History of gestational hypertension; S/P repeat low transverse C-section; Supervision of other normal pregnancy, antepartum; Obesity complicating pregnancy, childbirth, or puerperium, antepartum; and Previous cesarean delivery affecting pregnancy, antepartum on their problem list.  Patient reports ?bright, red vaginal bleeding with wiping this morning about 0700. She denies any abdominal pain/contractions, vaginal discharge or SI (last SI ~ 1 wk ago).   Contractions: Not present. Vag. Bleeding: Scant.  Movement: Present. Denies leaking of fluid.   The following portions of the patient's history were reviewed and updated as appropriate: allergies, current medications, past family history, past medical history, past social history, past surgical history and problem list. Problem list updated.  Objective:   Vitals:   06/12/18 0847  BP: 108/72  Pulse: 92  Weight: 202 lb (91.6 kg)    Fetal Status: Fetal Heart Rate (bpm): 140 Fundal Height: 23 cm Movement: Present  Presentation: Undeterminable  General:  Alert, oriented and cooperative. Patient is in no acute distress.  Skin: Skin is warm and dry. No rash noted.   Cardiovascular: Normal heart rate noted  Respiratory: Normal respiratory effort, no problems with respiration noted  Abdomen: Soft, gravid, appropriate for gestational age.  Pain/Pressure: Absent     Pelvic: Cervical exam deferred Dilation: Closed Effacement (%): Thick Station: Ballotable, no evidence of blood in vagina, active bleeding coming from 1 cm round, mobile bump - ? Folliculitis as a cause of bikini shaving  Extremities: Normal range of motion.  Edema: None  Mental Status: Normal mood and affect. Normal behavior. Normal  judgment and thought content.   Assessment and Plan:  Pregnancy: W5Y0998 at [redacted]w[redacted]d  1. Need for immunization against influenza - Flu Vaccine QUAD 36+ mos IM  2. Previous cesarean delivery affecting pregnancy, antepartum - Advised she is NOT a candidate for TOLAC and a TOLAC is NOT recommended per consult with Dr. Ihor Dow - Will schedule appointment with MD at 36 wks for pre-op visit and scheduling of surgery - Patient verbalized an understanding  3. Supervision of other normal pregnancy, antepartum  4. Vaginal bleeding in pregnancy, second trimester - Bleeding found to not be vaginal in source -- bleeding only seen from RT labia majora - Notified that since swollen area is not causing pain, abx treatment is not recommended per Up-to-Date - Advised to call is swelling increases and/or pain starts   Preterm labor symptoms and general obstetric precautions including but not limited to vaginal bleeding, contractions, leaking of fluid and fetal movement were reviewed in detail with the patient. Please refer to After Visit Summary for other counseling recommendations.  Return in about 5 weeks (around 07/17/2018) for Return OB visit.  Future Appointments  Date Time Provider [place]  06/13/2018  8:15 AM [facility] [place] Emalee Knies, CNM

## 2018-06-12 NOTE — Patient Instructions (Addendum)

## 2018-06-13 ENCOUNTER — Ambulatory Visit (HOSPITAL_COMMUNITY)
Admission: RE | Admit: 2018-06-13 | Discharge: 2018-06-13 | Disposition: A | Discharge status: Home / Self Care | Payer: Medicaid Other | Source: Ambulatory Visit | Attending: Obstetrics and Gynecology | Admitting: Obstetrics and Gynecology

## 2018-06-13 DIAGNOSIS — O09292 Supervision of pregnancy with other poor reproductive or obstetric history, second trimester: Secondary | ICD-10-CM

## 2018-06-13 DIAGNOSIS — Z362 Encounter for other antenatal screening follow-up: Secondary | ICD-10-CM | POA: Insufficient documentation

## 2018-06-13 DIAGNOSIS — Z3A24 24 weeks gestation of pregnancy: Secondary | ICD-10-CM | POA: Insufficient documentation

## 2018-06-13 DIAGNOSIS — O99212 Obesity complicating pregnancy, second trimester: Secondary | ICD-10-CM

## 2018-06-13 DIAGNOSIS — O34219 Maternal care for unspecified type scar from previous cesarean delivery: Secondary | ICD-10-CM

## 2018-06-16 ENCOUNTER — Encounter: Payer: Self-pay | Admitting: General Practice

## 2018-07-10 ENCOUNTER — Other Ambulatory Visit: Payer: Medicaid Other

## 2018-07-10 ENCOUNTER — Ambulatory Visit (INDEPENDENT_AMBULATORY_CARE_PROVIDER_SITE_OTHER): Payer: Medicaid Other | Admitting: Obstetrics and Gynecology

## 2018-07-10 VITALS — BP 109/73 | HR 93 | Wt 200.6 lb

## 2018-07-10 DIAGNOSIS — Z23 Encounter for immunization: Secondary | ICD-10-CM

## 2018-07-10 DIAGNOSIS — O34219 Maternal care for unspecified type scar from previous cesarean delivery: Secondary | ICD-10-CM

## 2018-07-10 DIAGNOSIS — Z3480 Encounter for supervision of other normal pregnancy, unspecified trimester: Secondary | ICD-10-CM

## 2018-07-10 DIAGNOSIS — Z3482 Encounter for supervision of other normal pregnancy, second trimester: Secondary | ICD-10-CM

## 2018-07-10 NOTE — Progress Notes (Signed)
   PRENATAL VISIT NOTE  Subjective:  Amy Kim is a 32 y.o. L8V5643 at [redacted]w[redacted]d being seen today for ongoing prenatal care.  She is currently monitored for the following issues for this high-risk pregnancy and has History of C-section; Abdominal cyst; History of gestational hypertension; S/P repeat low transverse C-section; Supervision of other normal pregnancy, antepartum; Obesity complicating pregnancy, childbirth, or puerperium, antepartum; and Previous cesarean delivery affecting pregnancy, antepartum on their problem list.  Patient reports no complaints.  Contractions: Not present. Vag. Bleeding: None.  Movement: Present. Denies leaking of fluid.   The following portions of the patient's history were reviewed and updated as appropriate: allergies, current medications, past family history, past medical history, past social history, past surgical history and problem list. Problem list updated.  Objective:   Vitals:   07/10/18 0848  BP: 109/73  Pulse: 93  Weight: 200 lb 9.6 oz (91 kg)    Fetal Status: Fetal Heart Rate (bpm): 144 Fundal Height: 26 cm Movement: Present     General:  Alert, oriented and cooperative. Patient is in no acute distress.  Skin: Skin is warm and dry. No rash noted.   Cardiovascular: Normal heart rate noted  Respiratory: Normal respiratory effort, no problems with respiration noted  Abdomen: Soft, gravid, appropriate for gestational age.  Pain/Pressure: Absent     Pelvic: Cervical exam deferred        Extremities: Normal range of motion.  Edema: None  Mental Status: Normal mood and affect. Normal behavior. Normal judgment and thought content.   Assessment and Plan:  Pregnancy: P2R5188 at [redacted]w[redacted]d  1. Supervision of other normal pregnancy, antepartum - Information provided on third trimester pregnancy  - Tdap vaccine greater than or equal to 7yo IM  2. Previous cesarean delivery affecting pregnancy, antepartum - Plan to see MD starting at 36  wks  Preterm labor symptoms and general obstetric precautions including but not limited to vaginal bleeding, contractions, leaking of fluid and fetal movement were reviewed in detail with the patient. Please refer to After Visit Summary for other counseling recommendations.  Return in about 3 weeks (around 07/31/2018) for Return OB visit.  Future Appointments  Date Time Provider [place]  07/10/2018  9:10 AM Laury Deep, [place] None    Laury Deep, [place]

## 2018-07-10 NOTE — Progress Notes (Signed)
Patient is here for 28 wk lab work.

## 2018-07-10 NOTE — Patient Instructions (Addendum)
Tdap Vaccine (Tetanus, Diphtheria and Pertussis): What You Need to Know  1. Why get vaccinated? Tetanus, diphtheria and pertussis are very serious diseases. Tdap vaccine can protect [place] from these diseases. And, Tdap vaccine given to pregnant women can protect newborn babies against pertussis. TETANUS (Lockjaw) is rare in the [place] today. It causes painful muscle tightening and stiffness, usually all over the body.  It can lead to tightening of muscles in the head and neck so you can't open your mouth, swallow, or sometimes even breathe. Tetanus kills about 1 out of 10 people who are infected even after receiving the best medical care.  DIPHTHERIA is also rare in the [place] today. It can cause a thick coating to form in the back of the throat.  It can lead to breathing problems, heart failure, paralysis, and death.  PERTUSSIS (Whooping Cough) causes severe coughing spells, which can cause difficulty breathing, vomiting and disturbed sleep.  It can also lead to weight loss, incontinence, and rib fractures. Up to 2 in 100 adolescents and 5 in 100 adults with pertussis are hospitalized or have complications, which could include pneumonia or death.  These diseases are caused by bacteria. Diphtheria and pertussis are spread from person to person through secretions from coughing or sneezing. Tetanus enters the body through cuts, scratches, or wounds. Before vaccines, as many as 200,000 cases of diphtheria, 200,000 cases of pertussis, and hundreds of cases of tetanus, were reported in the [place] each year. Since vaccination began, reports of cases for tetanus and diphtheria have dropped by about 99% and for pertussis by about 80%. 2. Tdap vaccine Tdap vaccine can protect adolescents and adults from tetanus, diphtheria, and pertussis. One dose of Tdap is routinely given at age 14 or 59. People who did not get Tdap at that age should get it as soon as possible. Tdap is especially  important for healthcare professionals and anyone having close contact with a baby younger than 12 months. Pregnant women should get a dose of Tdap during every pregnancy, to protect the newborn from pertussis. Infants are most at risk for severe, life-threatening complications from pertussis. Another vaccine, called Td, protects against tetanus and diphtheria, but not pertussis. A Td booster should be given every 10 years. Tdap may be given as one of these boosters if you have never gotten Tdap before. Tdap may also be given after a severe cut or burn to prevent tetanus infection. Your doctor or the person giving you the vaccine can give you more information. Tdap may safely be given at the same time as other vaccines. 3. Some people should not get this vaccine  A person who has ever had a life-threatening allergic reaction after a previous dose of any diphtheria, tetanus or pertussis containing vaccine, OR has a severe allergy to any part of this vaccine, should not get Tdap vaccine. Tell the person giving the vaccine about any severe allergies.  Anyone who had coma or long repeated seizures within 7 days after a childhood dose of DTP or DTaP, or a previous dose of Tdap, should not get Tdap, unless a cause other than the vaccine was found. They can still get Td.  Talk to your doctor if you: ? have seizures or another nervous system problem, ? had severe pain or swelling after any vaccine containing diphtheria, tetanus or pertussis, ? ever had a condition called Guillain-Barr Syndrome (GBS), ? aren't feeling well on the day the shot is scheduled. 4. Risks With any medicine,  including vaccines, there is a chance of side effects. These are usually mild and go away on their own. Serious reactions are also possible but are rare. Most people who get Tdap vaccine do not have any problems with it. Mild problems following Tdap: (Did not interfere with activities)  Pain where the shot was given (about  3 in 4 adolescents or 2 in 3 adults)  Redness or swelling where the shot was given (about 1 person in 5)  Mild fever of at least 100.1F (up to about 1 in 25 adolescents or 1 in 100 adults)  Headache (about 3 or 4 people in 10)  Tiredness (about 1 person in 3 or 4)  Nausea, vomiting, diarrhea, stomach ache (up to 1 in 4 adolescents or 1 in 10 adults)  Chills, sore joints (about 1 person in 10)  Body aches (about 1 person in 3 or 4)  Rash, swollen glands (uncommon)  Moderate problems following Tdap: (Interfered with activities, but did not require medical attention)  Pain where the shot was given (up to 1 in 5 or 6)  Redness or swelling where the shot was given (up to about 1 in 16 adolescents or 1 in 12 adults)  Fever over 102F (about 1 in 100 adolescents or 1 in 250 adults)  Headache (about 1 in 7 adolescents or 1 in 10 adults)  Nausea, vomiting, diarrhea, stomach ache (up to 1 or 3 people in 100)  Swelling of the entire arm where the shot was given (up to about 1 in 500).  Severe problems following Tdap: (Unable to perform usual activities; required medical attention)  Swelling, severe pain, bleeding and redness in the arm where the shot was given (rare).  Problems that could happen after any vaccine:  People sometimes faint after a medical procedure, including vaccination. Sitting or lying down for about 15 minutes can help prevent fainting, and injuries caused by a fall. Tell your doctor if you feel dizzy, or have vision changes or ringing in the ears.  Some people get severe pain in the shoulder and have difficulty moving the arm where a shot was given. This happens very rarely.  Any medication can cause a severe allergic reaction. Such reactions from a vaccine are very rare, estimated at fewer than 1 in a million doses, and would happen within a few minutes to a few hours after the vaccination. As with any medicine, there is a very remote chance of a vaccine  causing a serious injury or death. The safety of vaccines is always being monitored. For more information, visit: http://www.aguilar.org/ 5. What if there is a serious problem? What should I look for? Look for anything that concerns you, such as signs of a severe allergic reaction, very high fever, or unusual behavior. Signs of a severe allergic reaction can include hives, swelling of the face and throat, difficulty breathing, a fast heartbeat, dizziness, and weakness. These would usually start a few minutes to a few hours after the vaccination. What should I do?  If you think it is a severe allergic reaction or other emergency that can't wait, call 9-1-1 or get the person to the nearest hospital. Otherwise, call your doctor.  Afterward, the reaction should be reported to the Vaccine Adverse Event Reporting System (VAERS). Your doctor might file this report, or you can do it yourself through the VAERS web site at www.vaers.SamedayNews.es, or by calling (403)211-7319. ? VAERS does not give medical advice. 6. The National Vaccine Injury Fiserv The  National Sport and exercise psychologist (VICP) is a Technical brewer that was created to compensate people who may have been injured by certain vaccines. Persons who believe they may have been injured by a vaccine can learn about the program and about filing a claim by calling (743) 039-4639 or visiting the [place] website at GoldCloset.com.ee. There is a time limit to file a claim for compensation. 7. How can I learn more?  Ask your doctor. He or she can give you the vaccine package insert or suggest other sources of information.  Call your local or state health department.  Contact the Centers for Disease Control and Prevention (CDC): ? Call (509) 616-3116 (1-800-CDC-INFO) or ? Visit CDC's website at http://hunter.com/ CDC Tdap Vaccine VIS (11/17/13) This information is not intended to replace advice given to you by your  health care provider. Make sure you discuss any questions you have with your health care provider. Document Released: 03/11/2012 Document Revised: 05/31/2016 Document Reviewed: 05/31/2016 Elsevier Interactive Patient Education  2017 [place] of Pregnancy The third trimester is from week 29 through week 42, months 7 through 9. This trimester is when your unborn baby (fetus) is growing very fast. At the end of the ninth month, the unborn baby is about 20 inches in length. It weighs about 6-10 pounds. Follow these instructions at home:  Avoid all smoking, herbs, and alcohol. Avoid drugs not approved by your doctor.  Do not use any tobacco products, including cigarettes, chewing tobacco, and electronic cigarettes. If you need help quitting, ask your doctor. You may get counseling or other support to help you quit.  Only take medicine as told by your doctor. Some medicines are safe and some are not during pregnancy.  Exercise only as told by your doctor. Stop exercising if you start having cramps.  Eat regular, healthy meals.  Wear a good support bra if your breasts are tender.  Do not use hot tubs, steam rooms, or saunas.  Wear your seat belt when driving.  Avoid raw meat, uncooked cheese, and liter boxes and soil used by cats.  Take your prenatal vitamins.  Take 1500-2000 milligrams of calcium daily starting at the 20th week of pregnancy until you deliver your baby.  Try taking medicine that helps you poop (stool softener) as needed, and if your doctor approves. Eat more fiber by eating fresh fruit, vegetables, and whole grains. Drink enough fluids to keep your pee (urine) clear or pale yellow.  Take warm water baths (sitz baths) to soothe pain or discomfort caused by hemorrhoids. Use hemorrhoid cream if your doctor approves.  If you have puffy, bulging veins (varicose veins), wear support hose. Raise (elevate) your feet for 15 minutes, 3-4 times a day. Limit  salt in your diet.  Avoid heavy lifting, wear low heels, and sit up straight.  Rest with your legs raised if you have leg cramps or low back pain.  Visit your dentist if you have not gone during your pregnancy. Use a soft toothbrush to brush your teeth. Be gentle when you floss.  You can have sex (intercourse) unless your doctor tells you not to.  Do not travel far distances unless you must. Only do so with your doctor's approval.  Take prenatal classes.  Practice driving to the hospital.  Pack your hospital bag.  Prepare the baby's room.  Go to your doctor visits. Get help if:  You are not sure if you are in labor or if your water has broken.  You  are dizzy.  You have mild cramps or pressure in your lower belly (abdominal).  You have a nagging pain in your belly area.  You continue to feel sick to your stomach (nauseous), throw up (vomit), or have watery poop (diarrhea).  You have bad smelling fluid coming from your vagina.  You have pain with peeing (urination). Get help right away if:  You have a fever.  You are leaking fluid from your vagina.  You are spotting or bleeding from your vagina.  You have severe belly cramping or pain.  You lose or gain weight rapidly.  You have trouble catching your breath and have chest pain.  You notice sudden or extreme puffiness (swelling) of your face, hands, ankles, feet, or legs.  You have not felt the baby move in over an hour.  You have severe headaches that do not go away with medicine.  You have vision changes. This information is not intended to replace advice given to you by your health care provider. Make sure you discuss any questions you have with your health care provider. Document Released: 12/05/2009 Document Revised: 02/16/2016 Document Reviewed: 11/11/2012 Elsevier Interactive Patient Education  2017 [employer].

## 2018-07-11 LAB — CBC
HEMATOCRIT: 34.2 % (ref 34.0–46.6)
Hemoglobin: 11.4 g/dL (ref 11.1–15.9)
MCH: 27.5 pg (ref 26.6–33.0)
MCHC: 33.3 g/dL (ref 31.5–35.7)
MCV: 83 fL (ref 79–97)
PLATELETS: 404 10*3/uL (ref 150–450)
RBC: 4.14 x10E6/uL (ref 3.77–5.28)
RDW: 13.4 % (ref 12.3–15.4)
WBC: 9.3 10*3/uL (ref 3.4–10.8)

## 2018-07-11 LAB — GLUCOSE TOLERANCE, 2 HOURS W/ 1HR
GLUCOSE, 1 HOUR: 178 mg/dL (ref 65–179)
GLUCOSE, 2 HOUR: 123 mg/dL (ref 65–152)
GLUCOSE, FASTING: 81 mg/dL (ref 65–91)

## 2018-07-11 LAB — HIV ANTIBODY (ROUTINE TESTING W REFLEX): HIV Screen 4th Generation wRfx: NONREACTIVE

## 2018-07-11 LAB — RPR: RPR: NONREACTIVE

## 2018-08-01 ENCOUNTER — Ambulatory Visit (INDEPENDENT_AMBULATORY_CARE_PROVIDER_SITE_OTHER): Payer: Medicaid Other | Admitting: Obstetrics and Gynecology

## 2018-08-01 ENCOUNTER — Encounter (HOSPITAL_COMMUNITY): Payer: Self-pay

## 2018-08-01 VITALS — BP 104/67 | HR 83 | Wt 201.6 lb

## 2018-08-01 DIAGNOSIS — Z3480 Encounter for supervision of other normal pregnancy, unspecified trimester: Secondary | ICD-10-CM

## 2018-08-01 DIAGNOSIS — Z3483 Encounter for supervision of other normal pregnancy, third trimester: Secondary | ICD-10-CM

## 2018-08-01 DIAGNOSIS — O34219 Maternal care for unspecified type scar from previous cesarean delivery: Secondary | ICD-10-CM

## 2018-08-01 DIAGNOSIS — O26843 Uterine size-date discrepancy, third trimester: Secondary | ICD-10-CM

## 2018-08-01 NOTE — Progress Notes (Signed)
   PRENATAL VISIT NOTE  Subjective:  Amy Kim is a 32 y.o. T0W4097 at [redacted]w[redacted]d being seen today for ongoing prenatal care.  She is currently monitored for the following issues for this low-risk pregnancy and has History of C-section; Abdominal cyst; History of gestational hypertension; S/P repeat low transverse C-section; Supervision of other normal pregnancy, antepartum; Obesity complicating pregnancy, childbirth, or puerperium, antepartum; and Previous cesarean delivery affecting pregnancy, antepartum on their problem list.  Patient reports no complaints.  Contractions: Not present. Vag. Bleeding: None.  Movement: Present. Denies leaking of fluid.   The following portions of the patient's history were reviewed and updated as appropriate: allergies, current medications, past family history, past medical history, past social history, past surgical history and problem list. Problem list updated.  Objective:   Vitals:   08/01/18 0820  BP: 104/67  Pulse: 83  Weight: 201 lb 9.6 oz (91.4 kg)    Fetal Status: Fetal Heart Rate (bpm): 138 Fundal Height: 35 cm Movement: Present     General:  Alert, oriented and cooperative. Patient is in no acute distress.  Skin: Skin is warm and dry. No rash noted.   Cardiovascular: Normal heart rate noted  Respiratory: Normal respiratory effort, no problems with respiration noted  Abdomen: Soft, gravid, appropriate for gestational age.  Pain/Pressure: Present     Pelvic: Cervical exam deferred        Extremities: Normal range of motion.  Edema: None  Mental Status: Normal mood and affect. Normal behavior. Normal judgment and thought content.   Assessment and Plan:  Pregnancy: D5H2992 at [redacted]w[redacted]d  1. Previous cesarean delivery affecting pregnancy, antepartum - Plan to start visits with MD at 37 weeks for RCS planning  2. Supervision of other normal pregnancy, antepartum  3. Uterine size date discrepancy pregnancy, third trimester - Growth U/S in 1  wk  Preterm labor symptoms and general obstetric precautions including but not limited to vaginal bleeding, contractions, leaking of fluid and fetal movement were reviewed in detail with the patient. Please refer to After Visit Summary for other counseling recommendations.  Return in about 1 week (around 08/08/2018) for Return OB visit.  Future Appointments  Date Time Provider [place]  08/06/2018  2:45 PM [place] 2 [facility]  08/14/2018 10:30 AM Laury Deep, CNM [facility] None  08/28/2018  8:30 AM Laury Deep, CNM [facility] None  09/01/2018  8:30 AM [place], Vickii Chafe, MD [place] None    Laury Deep, CNM

## 2018-08-06 ENCOUNTER — Ambulatory Visit (HOSPITAL_COMMUNITY)
Admission: RE | Admit: 2018-08-06 | Discharge: 2018-08-06 | Disposition: A | Discharge status: Home / Self Care | Payer: Medicaid Other | Source: Ambulatory Visit | Attending: Obstetrics and Gynecology | Admitting: Obstetrics and Gynecology

## 2018-08-06 ENCOUNTER — Other Ambulatory Visit: Payer: Self-pay | Admitting: Obstetrics and Gynecology

## 2018-08-06 DIAGNOSIS — Z3A31 31 weeks gestation of pregnancy: Secondary | ICD-10-CM | POA: Diagnosis not present

## 2018-08-06 DIAGNOSIS — O34219 Maternal care for unspecified type scar from previous cesarean delivery: Secondary | ICD-10-CM

## 2018-08-06 DIAGNOSIS — O26843 Uterine size-date discrepancy, third trimester: Secondary | ICD-10-CM

## 2018-08-06 DIAGNOSIS — O99213 Obesity complicating pregnancy, third trimester: Secondary | ICD-10-CM

## 2018-08-06 DIAGNOSIS — O09293 Supervision of pregnancy with other poor reproductive or obstetric history, third trimester: Secondary | ICD-10-CM

## 2018-08-07 ENCOUNTER — Other Ambulatory Visit: Payer: Self-pay

## 2018-08-07 ENCOUNTER — Encounter (HOSPITAL_COMMUNITY): Payer: Self-pay | Admitting: *Deleted

## 2018-08-07 ENCOUNTER — Inpatient Hospital Stay (HOSPITAL_COMMUNITY)
Admission: AD | Admit: 2018-08-07 | Discharge: 2018-08-07 | Disposition: A | Discharge status: Home / Self Care | Payer: Medicaid Other | Source: Ambulatory Visit | Attending: Obstetrics and Gynecology | Admitting: Obstetrics and Gynecology

## 2018-08-07 DIAGNOSIS — O368130 Decreased fetal movements, third trimester, not applicable or unspecified: Secondary | ICD-10-CM | POA: Diagnosis not present

## 2018-08-07 DIAGNOSIS — Z3689 Encounter for other specified antenatal screening: Secondary | ICD-10-CM

## 2018-08-07 DIAGNOSIS — Z3A31 31 weeks gestation of pregnancy: Secondary | ICD-10-CM | POA: Insufficient documentation

## 2018-08-07 NOTE — [facility] Note (Signed)
Presents with no FM since last night.  Denies LOF or VB.  States has had something to eat& drink this morning.

## 2018-08-07 NOTE — Telephone Encounter (Signed)
Called patient to inform her she need to go to MAU now. She has tried everything from home. Laury Deep, CNM has notified MAU.  Derl Barrow, RN

## 2018-08-07 NOTE — MAU Provider Note (Signed)
History   [id number]   Chief Complaint  Patient presents with  . Decreased Fetal Movement    HPI Amy Kim is a 32 y.o. female  [phone] here with report of decreased fetal movement since last night.  Reports feeling the baby move approximately 0 times since last night, until she arrive to MAU.  Denies vaginal bleeding or leaking of fluid.  Denies abdominal pain, fall, or trauma.   Patient's last menstrual period was 12/27/2017 (exact date).  OB History  Gravida Para Term Preterm AB Living  5 3 3  0 1 3  SAB TAB Ectopic Multiple Live Births  1 0 0 0 3    # Outcome Date GA Lbr Len/2nd Weight Sex Delivery Anes PTL Lv  5 Current           4 Term 08/11/15 [redacted]w[redacted]d  2705 g F CS-LTranv Spinal  LIV  3 Term 05/07/13 [redacted]w[redacted]d  3118 g M CS-Unspec   LIV     Birth Comments: no complications, born [place], [place]  2 Term 08/2010 [redacted]w[redacted]d  3033 g M CS-Unspec   LIV     Birth Comments: emergency c/s due to maternal blood pressure and fhr, born [place], [place]  1 SAB 09/2009 [redacted]w[redacted]d           Past Medical History:  Diagnosis Date  . Back pain    DDD  . History of migraine    last one 2 days ago  . Medical history non-contributory   . Urinary frequency     Family History  Problem Relation Age of Onset  . Hypertension Mother   . Hypertension Father   . Heart disease Paternal Grandmother     Social History   Socioeconomic History  . Marital status: Married    Spouse name: Not on file  . Number of children: Not on file  . Years of education: Not on file  . Highest education level: Not on file  Occupational History  . Not on file  Social Needs  . Financial resource strain: Not on file  . Food insecurity:    Worry: Not on file    Inability: Not on file  . Transportation needs:    Medical: Not on file    Non-medical: Not on file  Tobacco Use  . Smoking status: Never Smoker  . Smokeless tobacco: Never Used  Substance and Sexual Activity  . Alcohol use: Not Currently    Comment:  occaionally  . Drug use: No  . Sexual activity: Yes    Birth control/protection: None  Lifestyle  . Physical activity:    Days per week: Not on file    Minutes per session: Not on file  . Stress: Not on file  Relationships  . Social connections:    Talks on phone: Not on file    Gets together: Not on file    Attends religious service: Not on file    Active member of club or organization: Not on file    Attends meetings of clubs or organizations: Not on file    Relationship status: Not on file  Other Topics Concern  . Not on file  Social History Narrative  . Not on file    No Known Allergies  No current facility-administered medications on file prior to encounter.    Current Outpatient Medications on File Prior to Encounter  Medication Sig Dispense Refill  . Prenat-Methylfol-Chol-Fish Oil (PRENATAL + COMPLETE MULTI PO) Take by mouth.       Review  of Systems  Constitutional: Negative.   Gastrointestinal: Negative.   Genitourinary: Negative.      Physical Exam   Vitals:   08/07/18 0954 08/07/18 0959  BP:  115/71  Pulse:  91  Resp:  18  Temp:  97.9 F (36.6 C)  TempSrc:  Oral  SpO2:  99%  Weight: 91.1 kg   Height: 5\' 1"  (1.549 m)     Physical Exam  Constitutional: She is oriented to person, place, and time. She appears well-developed and well-nourished. No distress.  Respiratory: Effort normal and breath sounds normal.  GI: Soft. There is no tenderness.  Neurological: She is alert and oriented to person, place, and time.  Skin: Skin is warm and dry. She is not diaphoretic.  Psychiatric: She has a normal mood and affect. Her behavior is normal. Judgment and thought content normal.   NST:  Baseline: 145 bpm, Variability: Good {> 6 bpm), Accelerations: Reactive and Decelerations: Absent  MAU Course  Procedures No results found for this or any previous visit (from the past 24 hour(s)).  MDM Reactive NST Pt reports normal movement since being in  MAU Movement heard through monitor Pt reassured  Assessment and Plan  A: 1. Decreased fetal movements in third trimester, single or unspecified fetus   2. [redacted] weeks gestation of pregnancy   3. NST (non-stress test) reactive    P: Discharge home in stable condition Fetal movement form Discussed reasons to return to MAU   Jorje Guild, NP 08/07/2018 10:57 AM

## 2018-08-07 NOTE — Discharge Instructions (Signed)

## 2018-08-07 NOTE — Progress Notes (Signed)
FM audible, pt reports has felt movement since arrival to hospital.

## 2018-08-14 ENCOUNTER — Ambulatory Visit (INDEPENDENT_AMBULATORY_CARE_PROVIDER_SITE_OTHER): Payer: Medicaid Other | Admitting: Obstetrics and Gynecology

## 2018-08-14 VITALS — BP 108/70 | HR 96 | Wt 203.4 lb

## 2018-08-14 DIAGNOSIS — Z3483 Encounter for supervision of other normal pregnancy, third trimester: Secondary | ICD-10-CM

## 2018-08-14 DIAGNOSIS — O34219 Maternal care for unspecified type scar from previous cesarean delivery: Secondary | ICD-10-CM

## 2018-08-14 DIAGNOSIS — Z3480 Encounter for supervision of other normal pregnancy, unspecified trimester: Secondary | ICD-10-CM

## 2018-08-14 DIAGNOSIS — Z3A32 32 weeks gestation of pregnancy: Secondary | ICD-10-CM

## 2018-08-14 NOTE — Progress Notes (Signed)
   PRENATAL VISIT NOTE  Subjective:  Amy Kim is a 32 y.o. [id number] at [redacted]w[redacted]d being seen today for ongoing prenatal care.  She is currently monitored for the following issues for this low-risk pregnancy and has History of C-section; Abdominal cyst; History of gestational hypertension; S/P repeat low transverse C-section; Supervision of other normal pregnancy, antepartum; Obesity complicating pregnancy, childbirth, or puerperium, antepartum; and Previous cesarean delivery affecting pregnancy, antepartum on their problem list.  Patient reports no complaints.  Contractions: Irritability. Vag. Bleeding: None.  Movement: Present. Denies leaking of fluid.   The following portions of the patient's history were reviewed and updated as appropriate: allergies, current medications, past family history, past medical history, past social history, past surgical history and problem list. Problem list updated.  Objective:   Vitals:   08/14/18 1034  BP: 108/70  Pulse: 96  Weight: 203 lb 6.4 oz (92.3 kg)    Fetal Status: Fetal Heart Rate (bpm): 142 Fundal Height: 32 cm Movement: Present     General:  Alert, oriented and cooperative. Patient is in no acute distress.  Skin: Skin is warm and dry. No rash noted.   Cardiovascular: Normal heart rate noted  Respiratory: Normal respiratory effort, no problems with respiration noted  Abdomen: Soft, gravid, appropriate for gestational age.  Pain/Pressure: Present     Pelvic: Cervical exam deferred        Extremities: Normal range of motion.  Edema: None  Mental Status: Normal mood and affect. Normal behavior. Normal judgment and thought content.   Assessment and Plan:  Pregnancy: [id number] at [redacted]w[redacted]d  Previous cesarean delivery affecting pregnancy, antepartum - Repeat scheduled for 39 wks  Supervision of other normal pregnancy, antepartum   Preterm labor symptoms and general obstetric precautions including but not limited to vaginal bleeding,  contractions, leaking of fluid and fetal movement were reviewed in detail with the patient. Please refer to After Visit Summary for other counseling recommendations.  Return in about 2 weeks (around 08/28/2018) for Return OB visit.  Future Appointments  Date Time Provider [place]  08/28/2018  8:30 AM Laury Deep, CNM [facility] None  09/01/2018  8:30 AM Constant, Vickii Chafe, MD [facility] None    Laury Deep, CNM

## 2018-08-28 ENCOUNTER — Encounter: Payer: Medicaid Other | Admitting: Obstetrics and Gynecology

## 2018-09-01 ENCOUNTER — Ambulatory Visit (INDEPENDENT_AMBULATORY_CARE_PROVIDER_SITE_OTHER): Payer: Medicaid Other | Admitting: Obstetrics and Gynecology

## 2018-09-01 ENCOUNTER — Encounter: Payer: Self-pay | Admitting: Obstetrics and Gynecology

## 2018-09-01 VITALS — BP 106/68 | HR 78 | Wt 203.7 lb

## 2018-09-01 DIAGNOSIS — Z3483 Encounter for supervision of other normal pregnancy, third trimester: Secondary | ICD-10-CM

## 2018-09-01 DIAGNOSIS — Z98891 History of uterine scar from previous surgery: Secondary | ICD-10-CM

## 2018-09-01 DIAGNOSIS — Z3480 Encounter for supervision of other normal pregnancy, unspecified trimester: Secondary | ICD-10-CM

## 2018-09-01 DIAGNOSIS — Z8759 Personal history of other complications of pregnancy, childbirth and the puerperium: Secondary | ICD-10-CM

## 2018-09-01 NOTE — Progress Notes (Signed)
   PRENATAL VISIT NOTE  Subjective:  Amy Kim is a 32 y.o. [id number] at [redacted]w[redacted]d being seen today for ongoing prenatal care.  She is currently monitored for the following issues for this low-risk pregnancy and has History of C-section; Abdominal cyst; History of gestational hypertension; S/P repeat low transverse C-section; Supervision of other normal pregnancy, antepartum; Obesity complicating pregnancy, childbirth, or puerperium, antepartum; and Previous cesarean delivery affecting pregnancy, antepartum on their problem list.  Patient reports no complaints.  Contractions: Irritability. Vag. Bleeding: None.  Movement: Present. Denies leaking of fluid.   The following portions of the patient's history were reviewed and updated as appropriate: allergies, current medications, past family history, past medical history, past social history, past surgical history and problem list. Problem list updated.  Objective:   Vitals:   09/01/18 0823  BP: 106/68  Pulse: 78  Weight: 203 lb 11.2 oz (92.4 kg)    Fetal Status: Fetal Heart Rate (bpm): 146 Fundal Height: 35 cm Movement: Present     General:  Alert, oriented and cooperative. Patient is in no acute distress.  Skin: Skin is warm and dry. No rash noted.   Cardiovascular: Normal heart rate noted  Respiratory: Normal respiratory effort, no problems with respiration noted  Abdomen: Soft, gravid, appropriate for gestational age.  Pain/Pressure: Present     Pelvic: Cervical exam deferred        Extremities: Normal range of motion.  Edema: None  Mental Status: Normal mood and affect. Normal behavior. Normal judgment and thought content.   Assessment and Plan:  Pregnancy: [id number] at [redacted]w[redacted]d  1. Supervision of other normal pregnancy, antepartum Patient is doing well without complaints Cultures next visit  2. History of gestational hypertension Normotensive Continue ASA  3. History of C-section Patient scheduled for repeat c-section on  1/3 Patient plans depo-provera Patient desires to meet surgeon prior to delivery. Will try to schedule next appointments with Dr. Rosana Hoes  Preterm labor symptoms and general obstetric precautions including but not limited to vaginal bleeding, contractions, leaking of fluid and fetal movement were reviewed in detail with the patient. Please refer to After Visit Summary for other counseling recommendations.  Return in about 1 week (around 09/08/2018) for ROB.  No future appointments.  Mora Bellman, MD

## 2018-09-01 NOTE — Progress Notes (Signed)
ROB.  Wants to talk about CS

## 2018-09-04 ENCOUNTER — Encounter: Payer: Medicaid Other | Admitting: Obstetrics and Gynecology

## 2018-09-11 ENCOUNTER — Other Ambulatory Visit (HOSPITAL_COMMUNITY)
Admission: RE | Admit: 2018-09-11 | Discharge: 2018-09-11 | Disposition: A | Discharge status: Home / Self Care | Payer: Medicaid Other | Source: Ambulatory Visit | Attending: Obstetrics and Gynecology | Admitting: Obstetrics and Gynecology

## 2018-09-11 ENCOUNTER — Encounter (HOSPITAL_COMMUNITY): Payer: Self-pay

## 2018-09-11 ENCOUNTER — Ambulatory Visit (INDEPENDENT_AMBULATORY_CARE_PROVIDER_SITE_OTHER): Payer: Medicaid Other | Admitting: Obstetrics and Gynecology

## 2018-09-11 ENCOUNTER — Encounter: Payer: Self-pay | Admitting: Obstetrics and Gynecology

## 2018-09-11 ENCOUNTER — Encounter: Payer: Medicaid Other | Admitting: Obstetrics and Gynecology

## 2018-09-11 VITALS — BP 96/63 | HR 83 | Wt 207.0 lb

## 2018-09-11 DIAGNOSIS — O34219 Maternal care for unspecified type scar from previous cesarean delivery: Secondary | ICD-10-CM

## 2018-09-11 DIAGNOSIS — Z8759 Personal history of other complications of pregnancy, childbirth and the puerperium: Secondary | ICD-10-CM

## 2018-09-11 DIAGNOSIS — IMO0001 Reserved for inherently not codable concepts without codable children: Secondary | ICD-10-CM | POA: Insufficient documentation

## 2018-09-11 DIAGNOSIS — Z98891 History of uterine scar from previous surgery: Secondary | ICD-10-CM

## 2018-09-11 DIAGNOSIS — Z789 Other specified health status: Secondary | ICD-10-CM | POA: Insufficient documentation

## 2018-09-11 DIAGNOSIS — Z3483 Encounter for supervision of other normal pregnancy, third trimester: Secondary | ICD-10-CM

## 2018-09-11 DIAGNOSIS — Z3480 Encounter for supervision of other normal pregnancy, unspecified trimester: Secondary | ICD-10-CM

## 2018-09-11 NOTE — Progress Notes (Signed)
   PRENATAL VISIT NOTE  Subjective:  Amy Kim is a 32 y.o. [id number] at [redacted]w[redacted]d being seen today for ongoing prenatal care.  She is currently monitored for the following issues for this low-risk pregnancy and has History of C-section; Abdominal cyst; History of gestational hypertension; S/P repeat low transverse C-section; Supervision of other normal pregnancy, antepartum; Obesity complicating pregnancy, childbirth, or puerperium, antepartum; Previous cesarean delivery affecting pregnancy, antepartum; and Patient is Jehovah's Witness on their problem list.  Patient reports occasional contractions.  Contractions: Irritability. Vag. Bleeding: None.  Movement: Present. Denies leaking of fluid.   The following portions of the patient's history were reviewed and updated as appropriate: allergies, current medications, past family history, past medical history, past social history, past surgical history and problem list. Problem list updated.  Objective:   Vitals:   09/11/18 0938  BP: 96/63  Pulse: 83  Weight: 207 lb (93.9 kg)    Fetal Status: Fetal Heart Rate (bpm): 142   Movement: Present     General:  Alert, oriented and cooperative. Patient is in no acute distress.  Skin: Skin is warm and dry. No rash noted.   Cardiovascular: Normal heart rate noted  Respiratory: Normal respiratory effort, no problems with respiration noted  Abdomen: Soft, gravid, appropriate for gestational age.  Pain/Pressure: Absent     Pelvic: Cervical exam deferred        Extremities: Normal range of motion.  Edema: None  Mental Status: Normal mood and affect. Normal behavior. Normal judgment and thought content.   Assessment and Plan:  Pregnancy: [id number] at [redacted]w[redacted]d  1. Supervision of other normal pregnancy, antepartum  2. History of gestational hypertension BP stable today  3. History of C-section S/p 3 x CS Has RCS scheduled for 09/26/17 H/o uterine window noted on last CS, will send for repeat US - [place]  MFM OB FOLLOW UP; Future  4. Patient is Jehovah's Witness Refuses all blood products, even in life-threatening emergency, states she would rather die than receive blood - okay with platelets, albumin   Preterm labor symptoms and general obstetric precautions including but not limited to vaginal bleeding, contractions, leaking of fluid and fetal movement were reviewed in detail with the patient. Please refer to After Visit Summary for other counseling recommendations.  Return in about 1 week (around 09/18/2018) for OB visit (MD).  Future Appointments  Date Time Provider [place]  09/15/2018  3:45 PM [place] 2 [facility]  09/18/2018 10:30 AM Sloan Leiter, MD [facility] None    Sloan Leiter, MD

## 2018-09-12 LAB — GC/CHLAMYDIA PROBE AMP (~~LOC~~) NOT AT ARMC
CHLAMYDIA, DNA PROBE: NEGATIVE
NEISSERIA GONORRHEA: NEGATIVE

## 2018-09-13 LAB — STREP GP B NAA: STREP GROUP B AG: NEGATIVE

## 2018-09-15 ENCOUNTER — Other Ambulatory Visit: Payer: Self-pay | Admitting: Obstetrics and Gynecology

## 2018-09-15 ENCOUNTER — Inpatient Hospital Stay (HOSPITAL_COMMUNITY)
Admission: AD | Admit: 2018-09-15 | Discharge: 2018-09-15 | Disposition: A | Discharge status: Home / Self Care | Payer: Medicaid Other | Source: Ambulatory Visit | Attending: Obstetrics & Gynecology | Admitting: Obstetrics & Gynecology

## 2018-09-15 ENCOUNTER — Ambulatory Visit (HOSPITAL_COMMUNITY)
Admission: RE | Admit: 2018-09-15 | Discharge: 2018-09-15 | Disposition: A | Discharge status: Home / Self Care | Payer: Medicaid Other | Source: Ambulatory Visit | Attending: Obstetrics and Gynecology | Admitting: Obstetrics and Gynecology

## 2018-09-15 ENCOUNTER — Other Ambulatory Visit: Payer: Self-pay

## 2018-09-15 ENCOUNTER — Encounter (HOSPITAL_COMMUNITY): Payer: Self-pay | Admitting: *Deleted

## 2018-09-15 DIAGNOSIS — O99212 Obesity complicating pregnancy, second trimester: Secondary | ICD-10-CM | POA: Diagnosis not present

## 2018-09-15 DIAGNOSIS — Z362 Encounter for other antenatal screening follow-up: Secondary | ICD-10-CM

## 2018-09-15 DIAGNOSIS — O34219 Maternal care for unspecified type scar from previous cesarean delivery: Secondary | ICD-10-CM | POA: Diagnosis not present

## 2018-09-15 DIAGNOSIS — O368330 Maternal care for abnormalities of the fetal heart rate or rhythm, third trimester, not applicable or unspecified: Secondary | ICD-10-CM | POA: Diagnosis not present

## 2018-09-15 DIAGNOSIS — Z3A37 37 weeks gestation of pregnancy: Secondary | ICD-10-CM | POA: Insufficient documentation

## 2018-09-15 DIAGNOSIS — Z98891 History of uterine scar from previous surgery: Secondary | ICD-10-CM | POA: Diagnosis not present

## 2018-09-15 DIAGNOSIS — O09292 Supervision of pregnancy with other poor reproductive or obstetric history, second trimester: Secondary | ICD-10-CM | POA: Diagnosis not present

## 2018-09-15 DIAGNOSIS — Z3689 Encounter for other specified antenatal screening: Secondary | ICD-10-CM

## 2018-09-15 HISTORY — DX: Gestational (pregnancy-induced) hypertension without significant proteinuria, unspecified trimester: O13.9

## 2018-09-15 MED ORDER — LACTATED RINGERS IV SOLN
Freq: Once | INTRAVENOUS | Status: AC
  Administered 2018-09-15: 18:00:00 via INTRAVENOUS

## 2018-09-15 NOTE — MAU Note (Signed)
Sent down from MFM, was there for [place] to check prev c/s incision.  Baby was tachycardic on [place], sent here for monitoring.

## 2018-09-15 NOTE — Discharge Instructions (Signed)

## 2018-09-15 NOTE — MAU Provider Note (Signed)
History     CSN: [id number]  Arrival date and time: 09/15/18 1655   First Provider Initiated Contact with Patient 09/15/18 1732      Chief Complaint  Patient presents with  . fetal tach   HPI Amy Kim is a 32 y.o. [id number] at [redacted]w[redacted]d sent from MFM for evaluation of fetal tachycardia. Patient endorses occasional mild abdominal contractions but is otherwise without complaints. She denies vaginal bleeding, leaking of fluid, decreased fetal movement, fever, falls, or recent illness.   OB History    Gravida  5   Para  3   Term  3   Preterm  0   AB  1   Living  3     SAB  1   TAB  0   Ectopic  0   Multiple  0   Live Births  3           Past Medical History:  Diagnosis Date  . Back pain    DDD  . History of migraine    last one 2 days ago  . Pregnancy induced hypertension   . Urinary frequency     Past Surgical History:  Procedure Laterality Date  . CESAREAN SECTION     x 3   . CESAREAN SECTION N/A 08/11/2015   Procedure: REPEAT CESAREAN SECTION;  Surgeon: Truett Mainland, DO;  Location: [facility];  Service: Obstetrics;  Laterality: N/A;  . CHOLECYSTECTOMY N/A 03/21/2016   Procedure: LAPAROSCOPIC CHOLECYSTECTOMY;  Surgeon: Ralene Ok, MD;  Location: [facility];  Service: General;  Laterality: N/A;    Family History  Problem Relation Age of Onset  . Hypertension Mother   . Hypertension Father   . Heart disease Paternal Grandmother     Social History   Tobacco Use  . Smoking status: Never Smoker  . Smokeless tobacco: Never Used  Substance Use Topics  . Alcohol use: Not Currently    Comment: occaionally  . Drug use: No    Allergies: No Known Allergies  Medications Prior to Admission  Medication Sig Dispense Refill Last Dose  . Prenat-Methylfol-Chol-Fish Oil (PRENATAL + COMPLETE MULTI PO) Take 1 tablet by mouth daily.    Taking    Review of Systems  Constitutional: Negative for chills and fever.  Respiratory: Negative for shortness  of breath.   Gastrointestinal: Negative for abdominal pain.  Genitourinary: Negative for difficulty urinating, vaginal bleeding, vaginal discharge and vaginal pain.  Musculoskeletal: Negative for back pain.  Neurological: Negative for headaches.  All other systems reviewed and are negative.  Physical Exam   Blood pressure 127/71, pulse 91, temperature 98.5 F (36.9 C), temperature source Oral, resp. rate 17, last menstrual period 12/27/2017, SpO2 100 %.  Physical Exam  Nursing note and vitals reviewed. Constitutional: She is oriented to person, place, and time. She appears well-developed and well-nourished.  Cardiovascular: Normal rate.  Respiratory: Effort normal.  GI: She exhibits no distension. There is no abdominal tenderness. There is no rebound and no guarding.  Gravid  Neurological: She is alert and oriented to person, place, and time.  Skin: Skin is warm and dry.  Psychiatric: She has a normal mood and affect. Her behavior is normal. Judgment and thought content normal.    MAU Course/MDM   --Care plan reviewed with Dr. Rosana Hoes.  --Tachycardia resolved with IV fluid bolus --Reactive fetal tracing: baseline 140, moderate variability, positive accels, no decels --Toco: uterine irritability, rare contractions --Dr. Rosana Hoes at bedside in MAU to discuss changes in delivery  timing based on today's MFM observations  Patient Vitals for the past 24 hrs:  BP Temp Temp src Pulse Resp SpO2  09/15/18 1709 127/71 98.5 F (36.9 C) Oral 91 17 100 %    Meds ordered this encounter  Medications  . lactated ringers infusion   Assessment and Plan  --32 y.o. [id number] at [redacted]w[redacted]d  --Reactive fetal tracing --Discharge home in stable condition  F/U: OB appt with Dr. Rosana Hoes 09/18/18  Darlina Rumpf, CNM 09/15/2018, 7:09 PM

## 2018-09-18 ENCOUNTER — Telehealth (HOSPITAL_COMMUNITY): Payer: Self-pay | Admitting: *Deleted

## 2018-09-18 ENCOUNTER — Ambulatory Visit (INDEPENDENT_AMBULATORY_CARE_PROVIDER_SITE_OTHER): Payer: Medicaid Other | Admitting: Obstetrics and Gynecology

## 2018-09-18 ENCOUNTER — Encounter: Payer: Self-pay | Admitting: Obstetrics and Gynecology

## 2018-09-18 ENCOUNTER — Encounter: Payer: Medicaid Other | Admitting: Obstetrics and Gynecology

## 2018-09-18 VITALS — BP 114/73 | HR 91 | Wt 205.0 lb

## 2018-09-18 DIAGNOSIS — Z531 Procedure and treatment not carried out because of patient's decision for reasons of belief and group pressure: Secondary | ICD-10-CM

## 2018-09-18 DIAGNOSIS — O34219 Maternal care for unspecified type scar from previous cesarean delivery: Secondary | ICD-10-CM

## 2018-09-18 DIAGNOSIS — Z3480 Encounter for supervision of other normal pregnancy, unspecified trimester: Secondary | ICD-10-CM

## 2018-09-18 DIAGNOSIS — IMO0001 Reserved for inherently not codable concepts without codable children: Secondary | ICD-10-CM

## 2018-09-18 DIAGNOSIS — O99210 Obesity complicating pregnancy, unspecified trimester: Secondary | ICD-10-CM

## 2018-09-18 DIAGNOSIS — Z98891 History of uterine scar from previous surgery: Secondary | ICD-10-CM

## 2018-09-18 DIAGNOSIS — Z8759 Personal history of other complications of pregnancy, childbirth and the puerperium: Secondary | ICD-10-CM

## 2018-09-18 DIAGNOSIS — O99213 Obesity complicating pregnancy, third trimester: Secondary | ICD-10-CM

## 2018-09-18 DIAGNOSIS — Z3483 Encounter for supervision of other normal pregnancy, third trimester: Secondary | ICD-10-CM

## 2018-09-18 HISTORY — DX: Procedure and treatment not carried out because of patient's decision for reasons of belief and group pressure: Z53.1

## 2018-09-18 HISTORY — DX: Reserved for inherently not codable concepts without codable children: IMO0001

## 2018-09-18 NOTE — Telephone Encounter (Signed)
Instructed NPO after Midnight and arrive at [place] 12/27 for CS with verbalized understanding.

## 2018-09-18 NOTE — Progress Notes (Signed)
   PRENATAL VISIT NOTE  Subjective:  Amy Kim is a 32 y.o. [id number] at [redacted]w[redacted]d being seen today for ongoing prenatal care.  She is currently monitored for the following issues for this high-risk pregnancy and has History of C-section; Abdominal cyst; History of gestational hypertension; S/P repeat low transverse C-section; Supervision of other normal pregnancy, antepartum; Obesity complicating pregnancy, childbirth, or puerperium, antepartum; Previous cesarean delivery affecting pregnancy, antepartum; Patient is Jehovah's Witness; and Refusal of blood transfusions as patient is Jehovah's Witness on their problem list.  Patient reports no complaints.  Contractions: Irregular. Vag. Bleeding: None.  Movement: Present. Denies leaking of fluid.   The following portions of the patient's history were reviewed and updated as appropriate: allergies, current medications, past family history, past medical history, past social history, past surgical history and problem list. Problem list updated.  Objective:   Vitals:   09/18/18 1033  BP: 114/73  Pulse: 91  Weight: 205 lb (93 kg)   Fetal Status: Fetal Heart Rate (bpm): 145   Movement: Present     General:  Alert, oriented and cooperative. Patient is in no acute distress.  Skin: Skin is warm and dry. No rash noted.   Cardiovascular: Normal heart rate noted  Respiratory: Normal respiratory effort, no problems with respiration noted  Abdomen: Soft, gravid, appropriate for gestational age.  Pain/Pressure: Present     Pelvic: Cervical exam deferred        Extremities: Normal range of motion.     Mental Status: Normal mood and affect. Normal behavior. Normal judgment and thought content.   Assessment and Plan:  Pregnancy: [id number] at [redacted]w[redacted]d  1. Supervision of other normal pregnancy, antepartum  2. Obesity complicating pregnancy, childbirth, or puerperium, antepartum  3. History of C-section Scheduled for RCS for 09/19/18  4. History of  gestational hypertension BP stable  6. Refusal of blood transfusions as patient is Jehovah's Witness Patient refuses whole blood transfusion, okay with plasma   Term labor symptoms and general obstetric precautions including but not limited to vaginal bleeding, contractions, leaking of fluid and fetal movement were reviewed in detail with the patient. Please refer to After Visit Summary for other counseling recommendations.  Return in about 4 weeks (around 10/16/2018) for post partum check.  Future Appointments  Date Time Provider [place]  09/25/2018  9:15 AM [facility] PAT 5 [facility] None    Sloan Leiter, MD

## 2018-09-18 NOTE — Telephone Encounter (Signed)
Preadmission screen  

## 2018-09-19 ENCOUNTER — Inpatient Hospital Stay (HOSPITAL_COMMUNITY)
Admission: RE | Admit: 2018-09-19 | Discharge: 2018-09-21 | DRG: 788 | Disposition: A | Discharge status: Home / Self Care | Payer: Medicaid Other | Attending: Obstetrics & Gynecology | Admitting: Obstetrics & Gynecology

## 2018-09-19 ENCOUNTER — Encounter (HOSPITAL_COMMUNITY)
Admission: RE | Disposition: A | Discharge status: Home / Self Care | Payer: Self-pay | Source: Home / Self Care | Attending: Obstetrics & Gynecology

## 2018-09-19 ENCOUNTER — Inpatient Hospital Stay (HOSPITAL_COMMUNITY): Payer: Medicaid Other

## 2018-09-19 ENCOUNTER — Other Ambulatory Visit: Payer: Self-pay

## 2018-09-19 ENCOUNTER — Encounter (HOSPITAL_COMMUNITY): Payer: Self-pay | Admitting: *Deleted

## 2018-09-19 DIAGNOSIS — O34211 Maternal care for low transverse scar from previous cesarean delivery: Principal | ICD-10-CM | POA: Diagnosis present

## 2018-09-19 DIAGNOSIS — Z98891 History of uterine scar from previous surgery: Secondary | ICD-10-CM

## 2018-09-19 DIAGNOSIS — Z3A38 38 weeks gestation of pregnancy: Secondary | ICD-10-CM | POA: Diagnosis not present

## 2018-09-19 DIAGNOSIS — IMO0001 Reserved for inherently not codable concepts without codable children: Secondary | ICD-10-CM | POA: Diagnosis present

## 2018-09-19 DIAGNOSIS — O34219 Maternal care for unspecified type scar from previous cesarean delivery: Secondary | ICD-10-CM

## 2018-09-19 DIAGNOSIS — Z531 Procedure and treatment not carried out because of patient's decision for reasons of belief and group pressure: Secondary | ICD-10-CM

## 2018-09-19 DIAGNOSIS — Z789 Other specified health status: Secondary | ICD-10-CM | POA: Diagnosis present

## 2018-09-19 DIAGNOSIS — O99214 Obesity complicating childbirth: Secondary | ICD-10-CM | POA: Diagnosis present

## 2018-09-19 LAB — TYPE AND SCREEN
ABO/RH(D): B POS
Antibody Screen: NEGATIVE

## 2018-09-19 LAB — CBC
HCT: 36.7 % (ref 36.0–46.0)
Hemoglobin: 11.8 g/dL — ABNORMAL LOW (ref 12.0–15.0)
MCH: 26.8 pg (ref 26.0–34.0)
MCHC: 32.2 g/dL (ref 30.0–36.0)
MCV: 83.2 fL (ref 80.0–100.0)
Platelets: 412 10*3/uL — ABNORMAL HIGH (ref 150–400)
RBC: 4.41 MIL/uL (ref 3.87–5.11)
RDW: 14 % (ref 11.5–15.5)
WBC: 11.5 10*3/uL — ABNORMAL HIGH (ref 4.0–10.5)
nRBC: 0 % (ref 0.0–0.2)

## 2018-09-19 LAB — NO BLOOD PRODUCTS

## 2018-09-19 SURGERY — Surgical Case
Anesthesia: Spinal | Site: Abdomen | Wound class: Clean Contaminated

## 2018-09-19 MED ORDER — ONDANSETRON HCL 4 MG/2ML IJ SOLN
INTRAMUSCULAR | Status: DC | PRN
  Administered 2018-09-19: 4 mg via INTRAVENOUS

## 2018-09-19 MED ORDER — STERILE WATER FOR IRRIGATION IR SOLN
Status: DC | PRN
  Administered 2018-09-19: 1000 mL

## 2018-09-19 MED ORDER — SIMETHICONE 80 MG PO CHEW
80.0000 mg | CHEWABLE_TABLET | ORAL | Status: DC
  Administered 2018-09-19 – 2018-09-21 (×2): 80 mg via ORAL
  Filled 2018-09-19 (×2): qty 1

## 2018-09-19 MED ORDER — MAGNESIUM HYDROXIDE 400 MG/5ML PO SUSP: 30.0000 mL | ORAL | Status: DC | PRN

## 2018-09-19 MED ORDER — TRANEXAMIC ACID-NACL 1000-0.7 MG/100ML-% IV SOLN
1000.0000 mg | Freq: Once | INTRAVENOUS | Status: AC
  Administered 2018-09-19: 1000 mg via INTRAVENOUS

## 2018-09-19 MED ORDER — FENTANYL CITRATE (PF) 100 MCG/2ML IJ SOLN
INTRAMUSCULAR | Status: AC
  Filled 2018-09-19: qty 2

## 2018-09-19 MED ORDER — SENNOSIDES-DOCUSATE SODIUM 8.6-50 MG PO TABS
2.0000 | ORAL_TABLET | ORAL | Status: DC
  Administered 2018-09-19 – 2018-09-21 (×2): 2 via ORAL
  Filled 2018-09-19 (×2): qty 2

## 2018-09-19 MED ORDER — OXYCODONE-ACETAMINOPHEN 5-325 MG PO TABS: 1.0000 | ORAL_TABLET | ORAL | Status: DC | PRN

## 2018-09-19 MED ORDER — LACTATED RINGERS IV SOLN
INTRAVENOUS | Status: DC | PRN
  Administered 2018-09-19: 13:00:00 via INTRAVENOUS

## 2018-09-19 MED ORDER — PHENYLEPHRINE 8 MG IN D5W 100 ML (0.08MG/ML) PREMIX OPTIME
INJECTION | INTRAVENOUS | Status: DC | PRN
  Administered 2018-09-19: 60 ug/min via INTRAVENOUS

## 2018-09-19 MED ORDER — DEXAMETHASONE SODIUM PHOSPHATE 4 MG/ML IJ SOLN
INTRAMUSCULAR | Status: DC | PRN
  Administered 2018-09-19: 4 mg via INTRAVENOUS

## 2018-09-19 MED ORDER — OXYCODONE-ACETAMINOPHEN 5-325 MG PO TABS
1.0000 | ORAL_TABLET | ORAL | Status: DC | PRN
  Filled 2018-09-19: qty 1

## 2018-09-19 MED ORDER — DIBUCAINE 1 % RE OINT: 1.0000 "application " | TOPICAL_OINTMENT | RECTAL | Status: DC | PRN

## 2018-09-19 MED ORDER — ACETAMINOPHEN 500 MG PO TABS
1000.0000 mg | ORAL_TABLET | Freq: Four times a day (QID) | ORAL | Status: DC
  Administered 2018-09-19 – 2018-09-21 (×7): 1000 mg via ORAL
  Filled 2018-09-19 (×7): qty 2

## 2018-09-19 MED ORDER — FERROUS SULFATE 325 (65 FE) MG PO TABS
325.0000 mg | ORAL_TABLET | Freq: Two times a day (BID) | ORAL | Status: DC
  Administered 2018-09-19 – 2018-09-21 (×4): 325 mg via ORAL
  Filled 2018-09-19 (×4): qty 1

## 2018-09-19 MED ORDER — SODIUM CHLORIDE 0.9 % IR SOLN
Status: DC | PRN
  Administered 2018-09-19: 1000 mL

## 2018-09-19 MED ORDER — ENOXAPARIN SODIUM 40 MG/0.4ML ~~LOC~~ SOLN
40.0000 mg | SUBCUTANEOUS | Status: DC
  Administered 2018-09-20 – 2018-09-21 (×2): 40 mg via SUBCUTANEOUS
  Filled 2018-09-19 (×2): qty 0.4

## 2018-09-19 MED ORDER — MENTHOL 3 MG MT LOZG: 1.0000 | LOZENGE | OROMUCOSAL | Status: DC | PRN

## 2018-09-19 MED ORDER — CEFAZOLIN SODIUM-DEXTROSE 2-4 GM/100ML-% IV SOLN
2.0000 g | Freq: Once | INTRAVENOUS | Status: AC
  Administered 2018-09-19: 2 g via INTRAVENOUS

## 2018-09-19 MED ORDER — OXYCODONE-ACETAMINOPHEN 5-325 MG PO TABS: 2.0000 | ORAL_TABLET | ORAL | Status: DC | PRN

## 2018-09-19 MED ORDER — LACTATED RINGERS IV SOLN
INTRAVENOUS | Status: DC
  Administered 2018-09-19 (×2): via INTRAVENOUS

## 2018-09-19 MED ORDER — DIPHENHYDRAMINE HCL 25 MG PO CAPS: 25.0000 mg | ORAL_CAPSULE | Freq: Four times a day (QID) | ORAL | Status: DC | PRN

## 2018-09-19 MED ORDER — OXYTOCIN 10 UNIT/ML IJ SOLN
INTRAVENOUS | Status: DC | PRN
  Administered 2018-09-19: 40 [IU] via INTRAVENOUS

## 2018-09-19 MED ORDER — COCONUT OIL OIL: 1.0000 "application " | TOPICAL_OIL | Status: DC | PRN

## 2018-09-19 MED ORDER — ONDANSETRON HCL 4 MG/2ML IJ SOLN
INTRAMUSCULAR | Status: AC
  Filled 2018-09-19: qty 2

## 2018-09-19 MED ORDER — METHYLERGONOVINE MALEATE 0.2 MG/ML IJ SOLN
INTRAMUSCULAR | Status: AC
  Filled 2018-09-19: qty 1

## 2018-09-19 MED ORDER — OXYTOCIN 40 UNITS IN LACTATED RINGERS INFUSION - SIMPLE MED: 2.5000 [IU]/h | INTRAVENOUS | Status: AC

## 2018-09-19 MED ORDER — TETANUS-DIPHTH-ACELL PERTUSSIS 5-2.5-18.5 LF-MCG/0.5 IM SUSP: 0.5000 mL | Freq: Once | INTRAMUSCULAR | Status: DC

## 2018-09-19 MED ORDER — ZOLPIDEM TARTRATE 5 MG PO TABS: 5.0000 mg | ORAL_TABLET | Freq: Every evening | ORAL | Status: DC | PRN

## 2018-09-19 MED ORDER — PHENYLEPHRINE 8 MG IN D5W 100 ML (0.08MG/ML) PREMIX OPTIME
INJECTION | INTRAVENOUS | Status: AC
  Filled 2018-09-19: qty 100

## 2018-09-19 MED ORDER — LACTATED RINGERS IV SOLN
INTRAVENOUS | Status: DC
  Administered 2018-09-20: 01:00:00 via INTRAVENOUS

## 2018-09-19 MED ORDER — EPINEPHRINE PF 1 MG/10ML IJ SOSY
PREFILLED_SYRINGE | INTRAMUSCULAR | Status: DC | PRN
  Administered 2018-09-19: 100 ug via INTRAVENOUS

## 2018-09-19 MED ORDER — PRENATAL MULTIVITAMIN CH
1.0000 | ORAL_TABLET | Freq: Every day | ORAL | Status: DC
  Administered 2018-09-20: 1 via ORAL
  Filled 2018-09-19: qty 1

## 2018-09-19 MED ORDER — SOD CITRATE-CITRIC ACID 500-334 MG/5ML PO SOLN
30.0000 mL | ORAL | Status: AC
  Administered 2018-09-19: 30 mL via ORAL
  Filled 2018-09-19: qty 15

## 2018-09-19 MED ORDER — DEXAMETHASONE SODIUM PHOSPHATE 4 MG/ML IJ SOLN
INTRAMUSCULAR | Status: AC
  Filled 2018-09-19: qty 1

## 2018-09-19 MED ORDER — EPINEPHRINE PF 1 MG/ML IJ SOLN
INTRAMUSCULAR | Status: AC
  Filled 2018-09-19: qty 1

## 2018-09-19 MED ORDER — METHYLERGONOVINE MALEATE 0.2 MG/ML IJ SOLN
INTRAMUSCULAR | Status: DC | PRN
  Administered 2018-09-19: 0.2 mg via INTRAMUSCULAR

## 2018-09-19 MED ORDER — PHENYLEPHRINE HCL 10 MG/ML IJ SOLN
INTRAMUSCULAR | Status: DC | PRN
  Administered 2018-09-19 (×2): 200 ug via INTRAVENOUS

## 2018-09-19 MED ORDER — TRANEXAMIC ACID-NACL 1000-0.7 MG/100ML-% IV SOLN
INTRAVENOUS | Status: AC
  Filled 2018-09-19: qty 100

## 2018-09-19 MED ORDER — KETOROLAC TROMETHAMINE 30 MG/ML IJ SOLN
30.0000 mg | Freq: Once | INTRAMUSCULAR | Status: AC
  Administered 2018-09-19: 30 mg via INTRAVENOUS
  Filled 2018-09-19: qty 1

## 2018-09-19 MED ORDER — MORPHINE SULFATE (PF) 0.5 MG/ML IJ SOLN
INTRAMUSCULAR | Status: DC | PRN
  Administered 2018-09-19: .15 mg via EPIDURAL

## 2018-09-19 MED ORDER — FENTANYL CITRATE (PF) 100 MCG/2ML IJ SOLN
INTRAMUSCULAR | Status: DC | PRN
  Administered 2018-09-19: 15 ug via INTRAVENOUS

## 2018-09-19 MED ORDER — MORPHINE SULFATE (PF) 0.5 MG/ML IJ SOLN
INTRAMUSCULAR | Status: AC
  Filled 2018-09-19: qty 10

## 2018-09-19 MED ORDER — MEASLES, MUMPS & RUBELLA VAC IJ SOLR
0.5000 mL | Freq: Once | INTRAMUSCULAR | Status: DC
  Filled 2018-09-19: qty 0.5

## 2018-09-19 MED ORDER — WITCH HAZEL-GLYCERIN EX PADS: 1.0000 "application " | MEDICATED_PAD | CUTANEOUS | Status: DC | PRN

## 2018-09-19 MED ORDER — OXYTOCIN 10 UNIT/ML IJ SOLN
INTRAMUSCULAR | Status: AC
  Filled 2018-09-19: qty 4

## 2018-09-19 MED ORDER — CEFAZOLIN SODIUM-DEXTROSE 2-4 GM/100ML-% IV SOLN: 2.0000 g | INTRAVENOUS | Status: DC

## 2018-09-19 MED ORDER — GABAPENTIN 300 MG PO CAPS
300.0000 mg | ORAL_CAPSULE | Freq: Two times a day (BID) | ORAL | Status: DC
  Administered 2018-09-19 – 2018-09-20 (×3): 300 mg via ORAL
  Filled 2018-09-19 (×5): qty 1

## 2018-09-19 MED ORDER — BUPIVACAINE IN DEXTROSE 0.75-8.25 % IT SOLN
INTRATHECAL | Status: DC | PRN
  Administered 2018-09-19: 2 mL via INTRATHECAL

## 2018-09-19 MED ORDER — HYDROMORPHONE HCL 1 MG/ML IJ SOLN: 0.2000 mg | INTRAMUSCULAR | Status: DC | PRN

## 2018-09-19 MED ORDER — SIMETHICONE 80 MG PO CHEW: 80.0000 mg | CHEWABLE_TABLET | ORAL | Status: DC | PRN

## 2018-09-19 SURGICAL SUPPLY — 33 items
CHLORAPREP W/TINT 26ML (MISCELLANEOUS) ×3 IMPLANT
CLAMP CORD UMBIL (MISCELLANEOUS) ×3 IMPLANT
CLOTH BEACON ORANGE TIMEOUT ST (SAFETY) ×3 IMPLANT
DRSG OPSITE POSTOP 4X10 (GAUZE/BANDAGES/DRESSINGS) ×3 IMPLANT
ELECT REM PT RETURN 9FT ADLT (ELECTROSURGICAL) ×3
ELECTRODE REM PT RTRN 9FT ADLT (ELECTROSURGICAL) ×1 IMPLANT
GAUZE SPONGE 4X4 12PLY STRL LF (GAUZE/BANDAGES/DRESSINGS) ×6 IMPLANT
GLOVE BIOGEL PI IND STRL 7.0 (GLOVE) ×3 IMPLANT
GLOVE BIOGEL PI INDICATOR 7.0 (GLOVE) ×6
GLOVE ECLIPSE 7.0 STRL STRAW (GLOVE) ×3 IMPLANT
GOWN STRL REUS W/TWL LRG LVL3 (GOWN DISPOSABLE) ×6 IMPLANT
HEMOSTAT SURGICEL 4X8 (HEMOSTASIS) ×3 IMPLANT
NEEDLE HYPO 22GX1.5 SAFETY (NEEDLE) ×3 IMPLANT
NEEDLE HYPO 25X5/8 SAFETYGLIDE (NEEDLE) ×3 IMPLANT
NS IRRIG 1000ML POUR BTL (IV SOLUTION) ×3 IMPLANT
PACK C SECTION WH (CUSTOM PROCEDURE TRAY) ×3 IMPLANT
PAD ABD 7.5X8 STRL (GAUZE/BANDAGES/DRESSINGS) ×3 IMPLANT
PAD ABD 8X7 1/2 STERILE (GAUZE/BANDAGES/DRESSINGS) ×3 IMPLANT
PAD OB MATERNITY 4.3X12.25 (PERSONAL CARE ITEMS) ×3 IMPLANT
PENCIL SMOKE EVAC W/HOLSTER (ELECTROSURGICAL) ×3 IMPLANT
RETRACTOR TRAXI PANNICULUS (MISCELLANEOUS) ×1 IMPLANT
RTRCTR C-SECT PINK 25CM LRG (MISCELLANEOUS) ×3 IMPLANT
SPONGE LAP 18X18 RF (DISPOSABLE) ×9 IMPLANT
SUT PDS AB 0 CTX 36 PDP370T (SUTURE) ×3 IMPLANT
SUT PLAIN 2 0 XLH (SUTURE) ×3 IMPLANT
SUT VIC AB 0 CTX 36 (SUTURE) ×4
SUT VIC AB 0 CTX36XBRD ANBCTRL (SUTURE) ×2 IMPLANT
SUT VIC AB 4-0 KS 27 (SUTURE) ×3 IMPLANT
SYR CONTROL 10ML LL (SYRINGE) ×3 IMPLANT
TAPE CLOTH SURG 4X10 WHT LF (GAUZE/BANDAGES/DRESSINGS) ×3 IMPLANT
TOWEL OR 17X24 6PK STRL BLUE (TOWEL DISPOSABLE) ×3 IMPLANT
TRAXI PANNICULUS RETRACTOR (MISCELLANEOUS) ×2
TRAY FOLEY W/BAG SLVR 14FR LF (SET/KITS/TRAYS/PACK) ×3 IMPLANT

## 2018-09-19 NOTE — Anesthesia Procedure Notes (Signed)
Spinal  Patient location during procedure: OR Start time: 09/19/2018 12:20 PM End time: 09/19/2018 12:30 PM Staffing Anesthesiologist: Freddrick March, MD Performed: anesthesiologist  Preanesthetic Checklist Completed: patient identified, surgical consent, pre-op evaluation, timeout performed, IV checked, risks and benefits discussed and monitors and equipment checked Spinal Block Patient position: sitting Prep: site prepped and draped and DuraPrep Patient monitoring: cardiac monitor, continuous pulse ox and blood pressure Approach: midline Location: L3-4 Injection technique: single-shot Needle Needle type: Pencan  Needle gauge: 24 G Needle length: 9 cm Assessment Sensory level: T6 Additional Notes Functioning IV was confirmed and monitors were applied. Sterile prep and drape, including hand hygiene and sterile gloves were used. The patient was positioned and the spine was prepped. The skin was anesthetized with lidocaine.  Free flow of clear CSF was obtained prior to injecting local anesthetic into the CSF.  The spinal needle aspirated freely following injection.  The needle was carefully withdrawn.  The patient tolerated the procedure well.

## 2018-09-19 NOTE — H&P (Addendum)
Obstetric Preoperative History and Physical  Amy Kim is a 32 y.o. [id number] with IUP at [redacted]w[redacted]d presenting for scheduled cesarean section.  Reports good fetal movement, no bleeding, no contractions, no leaking of fluid.  No acute preoperative concerns.    Cesarean Section Indication: Previous uterine incision kerr x3 or greater; uterine window during last cesarean section  Prenatal Course Source of Care: [facility]  with onset of care at 11 weeks Pregnancy complications or risks: Patient Active Problem List   Diagnosis Date Noted  . Refusal of blood transfusions as patient is Jehovah's Witness 09/18/2018  . Patient is Jehovah's Witness 09/11/2018  . Previous cesarean delivery affecting pregnancy, antepartum 04/17/2018  . Supervision of other normal pregnancy, antepartum 03/20/2018  . S/P repeat low transverse C-section 08/11/2015  . Obesity complicating pregnancy, childbirth, or puerperium, antepartum 05/23/2015  . History of gestational hypertension 04/19/2015  . Abdominal cyst 03/29/2015  . History of C-section 03/24/2015  She plans to breastfeed She desires Depo-Provera for postpartum contraception.   Prenatal labs and studies: ABO, Rh: --/--/B POS (12/27 1005) Antibody: NEG (12/27 1005) Rubella: 1.44 (06/27 1038) RPR: Non Reactive (10/17 0837)  HBsAg: Negative (06/27 1038)  HIV: Non Reactive (10/17 0837)  LXB:WIOMBTDH (12/19 1138) 2 hr Glucola  81/178/123 Genetic screening normal Anatomy US normal  Prenatal Transfer Tool  Maternal Diabetes: No Genetic Screening: Normal Maternal Ultrasounds/Referrals: Normal Fetal Ultrasounds or other Referrals:  None Maternal Substance Abuse:  No Significant Maternal Medications:  None Significant Maternal Lab Results: None  Past Medical History:  Diagnosis Date  . Back pain    DDD  . History of migraine    last one 2 days ago  . Pregnancy induced hypertension   . Urinary frequency     Past Surgical History:  Procedure  Laterality Date  . CESAREAN SECTION     x 3   . CESAREAN SECTION N/A 08/11/2015   Procedure: REPEAT CESAREAN SECTION;  Surgeon: Truett Mainland, DO;  Location: [facility];  Service: Obstetrics;  Laterality: N/A;  . CHOLECYSTECTOMY N/A 03/21/2016   Procedure: LAPAROSCOPIC CHOLECYSTECTOMY;  Surgeon: Ralene Ok, MD;  Location: [facility];  Service: General;  Laterality: N/A;    OB History  Gravida Para Term Preterm AB Living  5 3 3  0 1 3  SAB TAB Ectopic Multiple Live Births  1 0 0 0 3    # Outcome Date GA Lbr Len/2nd Weight Sex Delivery Anes PTL Lv  5 Current           4 Term 08/11/15 [redacted]w[redacted]d  2705 g F CS-LTranv Spinal  LIV  3 Term 05/07/13 [redacted]w[redacted]d  3118 g M CS-Unspec   LIV     Birth Comments: no complications, born [place], [place]  2 Term 08/2010 [redacted]w[redacted]d  3033 g M CS-Unspec   LIV     Birth Comments: emergency c/s due to maternal blood pressure and fhr, born [place], [place]  1 SAB 09/2009 [redacted]w[redacted]d           Social History   Socioeconomic History  . Marital status: Married    Spouse name: Not on file  . Number of children: Not on file  . Years of education: Not on file  . Highest education level: Not on file  Occupational History  . Not on file  Social Needs  . Financial resource strain: Not hard at all  . Food insecurity:    Worry: Never true    Inability: Never true  . Transportation needs:  Medical: No    Non-medical: No  Tobacco Use  . Smoking status: Never Smoker  . Smokeless tobacco: Never Used  Substance and Sexual Activity  . Alcohol use: Not Currently    Comment: occaionally  . Drug use: No  . Sexual activity: Yes    Birth control/protection: None  Lifestyle  . Physical activity:    Days per week: Not on file    Minutes per session: Not on file  . Stress: Only a little  Relationships  . Social connections:    Talks on phone: Not on file    Gets together: Not on file    Attends religious service: Not on file    Active member of club or organization: Not on file     Attends meetings of clubs or organizations: Not on file    Relationship status: Not on file  Other Topics Concern  . Not on file  Social History Narrative  . Not on file    Family History  Problem Relation Age of Onset  . Hypertension Mother   . Hypertension Father   . Heart disease Paternal Grandmother     Medications Prior to Admission  Medication Sig Dispense Refill Last Dose  . Prenat-Methylfol-Chol-Fish Oil (PRENATAL + COMPLETE MULTI PO) Take 1 tablet by mouth daily.    09/18/2018 at Unknown time    No Known Allergies  Review of Systems: Pertinent items noted in HPI and remainder of comprehensive ROS otherwise negative.  Physical Exam: Ht 5\' 1"  (1.549 m)   Wt 93 kg   LMP 12/27/2017 (Exact Date)   BMI 38.73 kg/m  FHR by Doppler: 145 bpm CONSTITUTIONAL: Well-developed, well-nourished female in no acute distress.  HENT:  Normocephalic, atraumatic, External right and left ear normal. Oropharynx is clear and moist EYES: Conjunctivae and EOM are normal. Pupils are equal, round, and reactive to light. No scleral icterus.  NECK: Normal range of motion, supple, no masses SKIN: Skin is warm and dry. No rash noted. Not diaphoretic. No erythema. No pallor. [place]: Alert and oriented to person, place, and time. Normal reflexes, muscle tone coordination. No cranial nerve deficit noted. PSYCHIATRIC: Normal mood and affect. Normal behavior. Normal judgment and thought content. CARDIOVASCULAR: Normal heart rate noted, regular rhythm RESPIRATORY: Effort and breath sounds normal, no problems with respiration noted ABDOMEN: Soft, nontender, nondistended, gravid. Well-healed Pfannenstiel incisions. PELVIC: Deferred MUSCULOSKELETAL: Normal range of motion. No edema and no tenderness. 2+ distal pulses.   Pertinent Labs/Studies:   Results for orders placed or performed during the hospital encounter of 09/19/18 (from the past 72 hour(s))  CBC     Status: Abnormal   Collection Time:  09/19/18 10:04 AM  Result Value Ref Range   WBC 11.5 (H) 4.0 - 10.5 K/uL   RBC 4.41 3.87 - 5.11 MIL/uL   Hemoglobin 11.8 (L) 12.0 - 15.0 g/dL   HCT 36.7 36.0 - 46.0 %   MCV 83.2 80.0 - 100.0 fL   MCH 26.8 26.0 - 34.0 pg   MCHC 32.2 30.0 - 36.0 g/dL   RDW 14.0 11.5 - 15.5 %   Platelets 412 (H) 150 - 400 K/uL   nRBC 0.0 0.0 - 0.2 %    Comment: Performed at [facility], [address], [place], [place] [address]  Type and screen [place]     Status: None   Collection Time: 09/19/18 10:05 AM  Result Value Ref Range   ABO/RH(D) B POS    Antibody Screen NEG  Sample Expiration      09/22/2018 Performed at [facility], [address] [place], [place] [address]     Assessment and Plan :Amy Kim is a 32 y.o. [id number] at [redacted]w[redacted]d being admitted for scheduled repeat cesarean section. The risks of cesarean section discussed with the patient included but were not limited to: bleeding which may require transfusion or reoperation; infection which may require antibiotics; injury to bowel, bladder, ureters or other surrounding organs; injury to the fetus; need for additional procedures including hysterectomy in the event of a life-threatening hemorrhage; placental abnormalities wth subsequent pregnancies, incisional problems, thromboembolic phenomenon and other postoperative/anesthesia complications. The patient concurred with the proposed plan, giving informed written consent for the procedure. Patient has been NPO since last night she will remain NPO for procedure. Anesthesia and OR aware. Preoperative prophylactic antibiotics and SCDs ordered on call to the OR. To OR when ready.   This patient declines blood products for religious reasons, and she signed refusal of blood product form.  She does consent to receiving albumin. She will get preoperative TXA, and also will get uterotonics after delivery to help minimize intraoperative and postpartum blood loss.       Verita Schneiders, MD, [place] for [employer], [place]

## 2018-09-19 NOTE — Discharge Summary (Addendum)
Postpartum Discharge Summary     Patient Name: Amy Kim DOB: 06-22-1986 MRN: [id number]  Date of admission: 09/19/2018 Delivering Provider: Verita Kim A   Date of discharge: 09/21/2018  Admitting diagnosis: RCS Intrauterine pregnancy: [redacted]w[redacted]d     Secondary diagnosis:  Principal Problem:   S/P repeat low transverse C-section Active Problems:   History of C-section   Patient is Jehovah's Witness   Refusal of blood transfusions as patient is Jehovah's Witness   H/O: C-section   Previous cesarean section  Additional problems: none     Discharge diagnosis: Term Pregnancy Delivered                                                                                                Post partum procedures:None  Augmentation: NA  Complications: None  Hospital course:  Scheduled C/S   32 y.o. yo [id number] at [redacted]w[redacted]d was admitted to the hospital 09/19/2018 for scheduled cesarean section with the following indication:Prior Uterine Surgery.  Membrane Rupture Time/Date: 12:58 PM ,09/19/2018   Patient delivered a Viable infant.09/19/2018  Details of operation can be found in separate operative note.  Pateint had an uncomplicated postpartum course.  She is ambulating, tolerating a regular diet, passing flatus, and urinating well. Patient is discharged home in stable condition on  09/21/18 per her request for early d/c.         Magnesium Sulfate recieved: No BMZ received: No  Physical exam  Vitals:   09/20/18 1334 09/20/18 1450 09/20/18 2124 09/21/18 0631  BP: 111/71 131/85 105/66 116/70  Pulse: 79 83 88 80  Resp: 18 18 16 16   Temp:  97.7 F (36.5 C) 98.3 F (36.8 C) 98 F (36.7 C)  TempSrc:   Oral Oral  SpO2:  93% 98% 100%  Weight:      Height:       General: alert, cooperative and no distress Lochia: appropriate Uterine Fundus: firm Incision: Healing well with no significant drainage, No significant erythema, Dressing is clean, dry, and intact DVT Evaluation: No  evidence of DVT seen on physical exam. Labs: Lab Results  Component Value Date   WBC 12.3 (H) 09/20/2018   HGB 11.0 (L) 09/20/2018   HCT 33.9 (L) 09/20/2018   MCV 84.3 09/20/2018   PLT 304 09/20/2018   CMP Latest Ref Rng & Units 09/20/2018  Glucose 65 - 99 mg/dL -  BUN 6 - 20 mg/dL -  Creatinine 0.44 - 1.00 mg/dL 0.56  Sodium 134 - 144 mmol/L -  Potassium 3.5 - 5.2 mmol/L -  Chloride 96 - 106 mmol/L -  CO2 20 - 29 mmol/L -  Calcium 8.7 - 10.2 mg/dL -  Total Protein 6.0 - 8.5 g/dL -  Total Bilirubin 0.0 - 1.2 mg/dL -  Alkaline Phos 39 - 117 IU/L -  AST 0 - 40 IU/L -  ALT 0 - 32 IU/L -    Discharge instruction: per After Visit Summary and "Baby and Me Booklet".  After visit meds:  Allergies as of 09/21/2018   No Known Allergies     Medication List    TAKE  these medications   ferrous sulfate 325 (65 FE) MG tablet Take 1 tablet (325 mg total) by mouth 2 (two) times daily with a meal.   ibuprofen 800 MG tablet Commonly known as:  ADVIL,MOTRIN Take 1 tablet (800 mg total) by mouth every 8 (eight) hours as needed.   oxyCODONE-acetaminophen 5-325 MG tablet Commonly known as:  PERCOCET/ROXICET Take 1-2 tablets by mouth every 4 (four) hours as needed for moderate pain or severe pain.   PRENATAL + COMPLETE MULTI PO Take 1 tablet by mouth daily.   senna-docusate 8.6-50 MG tablet Commonly known as:  Senokot-S Take 2 tablets by mouth daily. Start taking on:  September 22, 2018   simethicone 80 MG chewable tablet Commonly known as:  MYLICON Chew 1 tablet (80 mg total) by mouth as needed for flatulence.            Discharge Care Instructions  (From admission, onward)         Start     Ordered   09/21/18 0000  Discharge wound care:    Comments:  Leave dressing on for 7 days   09/21/18 0749          Diet: routine diet  Activity: Advance as tolerated. Pelvic rest for 6 weeks.   Outpatient follow up:4 weeks Follow up Appt: Future Appointments  Date Time  Provider [place]  09/25/2018  9:15 AM [facility] PAT 5 [facility] None  10/02/2018  1:15 PM [place] None  10/16/2018  8:30 AM Amy Mode, MD [facility] None   Follow up Visit:   Please schedule this patient for Postpartum visit in: 4 weeks with the following provider: Any provider For C/S patients schedule nurse incision check in weeks 2 weeks: yes Low risk pregnancy complicated by: None Delivery Kim:  CS Anticipated Birth Control:  Depo PP Procedures needed: Incision check  Schedule Integrated BH visit: no      Newborn Data: Live born female  Birth Weight: 6 lb 0.7 oz (2740 g) APGAR: 9, 9  Newborn Delivery   Birth date/time:  09/19/2018 12:59:00 Delivery type:  C-Section, Low Transverse Trial of labor:  No C-section categorization:  Repeat     Baby Feeding: Breast Disposition:home with mother   09/21/2018 Amy Raveling, MD  CNM attestation I have seen and examined this patient and agree with above documentation in the resident's note.   Amy Kim is a 32 y.o. [id number] s/p rLTCS.   Pain is well controlled.  Plan for birth control is Depo-Provera.  Method of Feeding: breast  PE:  BP 116/70 (BP Location: Right Arm)   Pulse 80   Temp 98 F (36.7 C) (Oral)   Resp 16   Ht 5\' 1"  (1.549 m)   Wt 93 kg   LMP 12/27/2017 (Exact Date)   SpO2 100%   Breastfeeding Unknown   BMI 38.73 kg/m  Fundus firm  Recent Labs    09/19/18 1004 09/20/18 0542  HGB 11.8* 11.0*  HCT 36.7 33.9*     Plan: discharge today - postpartum care discussed - f/u clinic in 1-2wks for incision check, then 4-6 weeks for postpartum visit   Amy Kim, CNM 8:47 AM 09/21/2018

## 2018-09-19 NOTE — Op Note (Signed)
Amy Kim PROCEDURE DATE: 09/19/2018  PREOPERATIVE DIAGNOSES: Intrauterine pregnancy at [redacted]w[redacted]d weeks gestation; previous uterine incision x3; history of uterine window seen during last cesarean section  POSTOPERATIVE DIAGNOSES: The same; large lower uterine segment window noted.  PROCEDURE: Repeat Low Transverse Cesarean Section  SURGEON:  Dr. Verita Schneiders  ASSISTANT:  Dr. Aura Camps  ANESTHESIOLOGY TEAM: Anesthesiologist: Freddrick March, MD CRNA: Asher Muir, CRNA Student Nurse Anesthetist: Terie Purser, RN  INDICATIONS: Amy Kim is a 32 y.o. [phone] at [redacted]w[redacted]d here for repeat cesarean section secondary to the indications listed under preoperative diagnoses; please see preoperative note for further details.  The risks of cesarean section were discussed with the patient including but were not limited to: bleeding which may require transfusion or reoperation; infection which may require antibiotics; injury to bowel, bladder, ureters or other surrounding organs; injury to the fetus; need for additional procedures including hysterectomy in the event of a life-threatening hemorrhage; placental abnormalities wth subsequent pregnancies, incisional problems, thromboembolic phenomenon and other postoperative/anesthesia complications.   The patient concurred with the proposed plan, giving informed written consent for the procedure.    FINDINGS:  Viable female infant in cephalic presentation.  Apgars 9 and 9. Nuchal cord x 1.  Clear amniotic fluid.  Intact placenta, three vessel cord. 6 cm lower uterine segment window noted.  Otherwise normal uterus, fallopian tubes and ovaries bilaterally.  Highly recommend delivery at 36-37 weeks for any subsequent pregnancies.  Minimal intraperitoneal adhesive disease; the rectus muscles were very adherent to the fascia and peritoneum and there was some difficulty prior to entering the peritoneal layer and in getting adequate exposure.     ANESTHESIA: Spinal INTRAVENOUS FLUIDS: 2800 ml   ESTIMATED BLOOD LOSS: 125 ml URINE OUTPUT:  200 ml SPECIMENS: Placenta sent to L&D COMPLICATIONS: None immediate  PROCEDURE IN DETAIL:  The patient preoperatively received intravenous antibiotics and had sequential compression devices applied to her lower extremities.  She was then taken to the operating room where spinal anesthesia was administered and was found to be adequate. She was then placed in a dorsal supine position with a leftward tilt, and prepped and draped in a sterile manner.  A foley catheter was placed into her bladder and attached to constant gravity.  After an adequate timeout was performed, a Pfannenstiel skin incision was made with scalpel on her preexisting scar and carried through to the underlying layer of fascia. The fascia was incised in the midline, and this incision was extended bilaterally using the Mayo scissors.  Kocher clamps were applied to the superior aspect of the fascial incision and the underlying rectus muscles were dissected off sharply.  A similar process was carried out on the inferior aspect of the fascial incision. The rectus muscles were separated in the midline sharply with some difficulty.  The peritoneum was entered sharply. In order to obtain adequate exposure, the adherent rectus muscles had to be transected about 1.5 cm horizontally on both sides.  The Alexis self-retaining retractor was introduced into the abdominal cavity.  Attention was turned to the lower uterine segment where a large uterine window was noted; it was just the amniotic sac on the surface with no serosa.  This sac was entered sharply and extended bilaterally bluntly.  The infant was successfully delivered, the cord was clamped and cut after one minute, and the infant was handed over to the awaiting neonatology team. Uterine massage was then administered, and the placenta delivered intact with a three-vessel cord. The uterus was  then  cleared of clots and debris.  The hysterotomy was closed with 0 Vicryl in a running locked fashion, and an imbricating layer was also placed with 0 Vicryl.  Figure-of-eight 0 Vicryl serosal stitches were placed to help with hemostasis.  The pelvis was cleared of all clot and debris. Hemostasis was confirmed on all surfaces.  The retractor was removed.  The rectus muscles were reapproximated using 0 Vicryl interrupted stitches. The fascia was then closed using 0 PDS in a running fashion.  The subcutaneous layer was irrigated, and the skin was closed with a 4-0 Vicryl subcuticular stitch. The patient tolerated the procedure well. Sponge, instrument and needle counts were correct x 3.  She was taken to the recovery room in stable condition.    Verita Schneiders, MD, [place] for [employer], [place]

## 2018-09-19 NOTE — Lactation Note (Signed)
This note was copied from a baby's chart. Lactation Consultation Note  Patient Name: Amy Kim Today's Date: 09/19/2018 Reason for consult: Initial assessment;Early term 16-38.6wks  P4 mother whose infant is now 27 hours old.  Mother had breast feeding experience with her other children.  She breast fed each one for 1 year.  Mother was doing STS when I arrived.  She had no questions/concerns related to breast feeding.  Encouraged feeding 8-12 times/24 hours of sooner if baby shows feeding cues.  Mother was familiar with hand expression.  Colostrum container provided and milk storage times reviewed.    Mom made aware of O/P services, breastfeeding support groups, community resources, and our phone # for post-discharge questions.  Mother will call for latch assistance as needed.  Father present.   Maternal Data Formula Feeding for Exclusion: No Has patient been taught Hand Expression?: Yes Does the patient have breastfeeding experience prior to this delivery?: Yes  Feeding Feeding Type: Breast Fed  LATCH Score Latch: Grasps breast easily, tongue down, lips flanged, rhythmical sucking.  Audible Swallowing: A few with stimulation  Type of Nipple: Everted at rest and after stimulation  Comfort (Breast/Nipple): Soft / non-tender  Hold (Positioning): Assistance needed to correctly position infant at breast and maintain latch.  LATCH Score: 8  Interventions    Lactation Tools Discussed/Used     Consult Status Consult Status: Follow-up Date: 09/20/18 Follow-up type: In-patient    Amy Kim 09/19/2018, 5:09 PM

## 2018-09-19 NOTE — Anesthesia Postprocedure Evaluation (Signed)
Anesthesia Post Note  Patient: [name]  Procedure(s) Performed: CESAREAN SECTION (N/A Abdomen)     Patient location during evaluation: PACU Anesthesia Type: Spinal Level of consciousness: oriented and awake and alert Pain management: pain level controlled Vital Signs Assessment: post-procedure vital signs reviewed and stable Respiratory status: spontaneous breathing, respiratory function stable and patient connected to nasal cannula oxygen Cardiovascular status: blood pressure returned to baseline and stable Postop Assessment: no headache, no backache and no apparent nausea or vomiting Anesthetic complications: no    Last Vitals:  Vitals:   09/19/18 1445 09/19/18 1500  BP: 112/70 116/76  Pulse: 93 90  Resp: 20 20  Temp: 36.6 C   SpO2: 98% 100%    Last Pain:  Vitals:   09/19/18 1445  TempSrc: Oral  PainSc:    Pain Goal: Patients Stated Pain Goal: 4 (09/19/18 1125)               Amy Kim L Jacci Ruberg

## 2018-09-19 NOTE — Transfer of Care (Signed)
Immediate Anesthesia Transfer of Care Note  Patient: Amy Kim  Procedure(s) Performed: CESAREAN SECTION (N/A Abdomen)  Patient Location: PACU  Anesthesia Type:Spinal  Level of Consciousness: awake  Airway & Oxygen Therapy: Patient Spontanous Breathing  Post-op Assessment: Report given to RN  Post vital signs: Reviewed and stable  Last Vitals:  Vitals Value Taken Time  BP    Temp    Pulse    Resp    SpO2      Last Pain:  Vitals:   09/19/18 1018  TempSrc:   PainSc: 0-No pain      Patients Stated Pain Goal: 4 (42/59/56 3875)  Complications: No apparent anesthesia complications

## 2018-09-19 NOTE — Anesthesia Preprocedure Evaluation (Addendum)
Anesthesia Evaluation  Patient identified by MRN, date of birth, ID band Patient awake    Reviewed: Allergy & Precautions, NPO status , Patient's Chart, lab work & pertinent test results  Airway Mallampati: II  TM Distance: >3 FB Neck ROM: Full    Dental no notable dental hx. (+) Teeth Intact, Dental Advisory Given   Pulmonary neg pulmonary ROS,    Pulmonary exam normal breath sounds clear to auscultation       Cardiovascular hypertension, negative cardio ROS Normal cardiovascular exam Rhythm:Regular Rate:Normal     Neuro/Psych negative neurological ROS  negative psych ROS   GI/Hepatic negative GI ROS, Neg liver ROS,   Endo/Other  Morbid obesity  Renal/GU negative Renal ROS  negative genitourinary   Musculoskeletal negative musculoskeletal ROS (+)   Abdominal   Peds  Hematology  (+) REFUSES BLOOD PRODUCTS, JEHOVAH'S WITNESS  Anesthesia Other Findings Repeat C/S  Reproductive/Obstetrics                            Anesthesia Physical Anesthesia Plan  ASA: III  Anesthesia Plan: Spinal   Post-op Pain Management:    Induction:   PONV Risk Score and Plan: 2 and Treatment may vary due to age or medical condition  Airway Management Planned: Natural Airway  Additional Equipment:   Intra-op Plan:   Post-operative Plan:   Informed Consent: I have reviewed the patients History and Physical, chart, labs and discussed the procedure including the risks, benefits and alternatives for the proposed anesthesia with the patient or authorized representative who has indicated his/her understanding and acceptance.   Dental advisory given  Plan Discussed with: CRNA  Anesthesia Plan Comments:         Anesthesia Quick Evaluation

## 2018-09-20 ENCOUNTER — Encounter (HOSPITAL_COMMUNITY): Payer: Self-pay | Admitting: Obstetrics & Gynecology

## 2018-09-20 LAB — CBC
HEMATOCRIT: 33.9 % — AB (ref 36.0–46.0)
Hemoglobin: 11 g/dL — ABNORMAL LOW (ref 12.0–15.0)
MCH: 27.4 pg (ref 26.0–34.0)
MCHC: 32.4 g/dL (ref 30.0–36.0)
MCV: 84.3 fL (ref 80.0–100.0)
Platelets: 304 10*3/uL (ref 150–400)
RBC: 4.02 MIL/uL (ref 3.87–5.11)
RDW: 13.9 % (ref 11.5–15.5)
WBC: 12.3 10*3/uL — ABNORMAL HIGH (ref 4.0–10.5)
nRBC: 0 % (ref 0.0–0.2)

## 2018-09-20 LAB — CREATININE, SERUM
Creatinine, Ser: 0.56 mg/dL (ref 0.44–1.00)
GFR calc Af Amer: >60 mL/min (ref >60)
GFR calc non Af Amer: >60 mL/min (ref >60)

## 2018-09-20 LAB — RPR: RPR Ser Ql: NONREACTIVE

## 2018-09-20 MED ORDER — OXYCODONE HCL 5 MG PO TABS
5.0000 mg | ORAL_TABLET | Freq: Four times a day (QID) | ORAL | Status: DC | PRN
  Administered 2018-09-20: 5 mg via ORAL
  Filled 2018-09-20: qty 1

## 2018-09-20 NOTE — Progress Notes (Signed)
Postpartum Day 1: Cesarean Delivery Subjective: Patient reports incisional pain, tolerating PO, + flatus and no problems voiding.  Ambulating. Baby doing well at bedside. Breastfeeding.   Objective: Vital signs in last 24 hours: Temp:  [97.5 F (36.4 C)-98.4 F (36.9 C)] 97.8 F (36.6 C) (12/28 7989) Pulse Rate:  [65-98] 75 (12/28 0638) Resp:  [12-23] 18 (12/28 2119) BP: (90-128)/(51-93) 90/60 (12/28 4174) SpO2:  [94 %-100 %] 100 % (12/28 0814) Weight:  [93 kg] 93 kg (12/27 1126)  Physical Exam:  General: alert, cooperative and no distress Lochia: appropriate Uterine Fundus: firm Incision: healing well, no significant drainage DVT Evaluation: No evidence of DVT seen on physical exam. Negative Homan's sign. No cords or calf tenderness.  Recent Labs    09/19/18 1004 09/20/18 0542  HGB 11.8* 11.0*  HCT 36.7 33.9*    Assessment/Plan: Status post Cesarean section. Doing well postoperatively.  Analgesia as needed Depo Provera for contraception. Continue current care.  Verita Schneiders, MD 09/20/2018, 7:56 AM

## 2018-09-20 NOTE — Lactation Note (Signed)
This note was copied from a baby's chart. Lactation Consultation Note  Patient Name: Amy Kim RJJOA'C Date: 09/20/2018 Reason for consult: Follow-up assessment;Early term 58-38.6wks  P4 mother whose infant is now 22 hours old.  Mother has breast fed her other children for 1 year each.  Pediatrician was in room examining baby as I entered.  Mother had just started breast feeding baby.  After exam mother put baby to breast in the football hold on the right breast without any assistance.  Baby began rythymic sucking immediately and mother denied pain with latching.  Mother had no questions/concerns.  Her breasts are soft and non tender and nipples intact.  She feels comfortable in her ability to breast feed and will call for assistance if needed.     Maternal Data Formula Feeding for Exclusion: No Has patient been taught Hand Expression?: Yes Does the patient have breastfeeding experience prior to this delivery?: Yes  Feeding Feeding Type: Breast Fed  LATCH Score Latch: Grasps breast easily, tongue down, lips flanged, rhythmical sucking.  Audible Swallowing: Spontaneous and intermittent  Type of Nipple: Everted at rest and after stimulation  Comfort (Breast/Nipple): Soft / non-tender  Hold (Positioning): No assistance needed to correctly position infant at breast.  LATCH Score: 10  Interventions    Lactation Tools Discussed/Used     Consult Status Consult Status: Follow-up Date: 09/21/18 Follow-up type: In-patient    Little Ishikawa 09/20/2018, 2:46 PM

## 2018-09-21 MED ORDER — IBUPROFEN 800 MG PO TABS: 800.0000 mg | ORAL_TABLET | Freq: Three times a day (TID) | ORAL | 0 refills | Status: DC | PRN

## 2018-09-21 MED ORDER — FERROUS SULFATE 325 (65 FE) MG PO TABS: 325.0000 mg | ORAL_TABLET | Freq: Two times a day (BID) | ORAL | 0 refills | Status: DC

## 2018-09-21 MED ORDER — SIMETHICONE 80 MG PO CHEW: 80.0000 mg | CHEWABLE_TABLET | ORAL | 0 refills | Status: DC | PRN

## 2018-09-21 MED ORDER — SENNOSIDES-DOCUSATE SODIUM 8.6-50 MG PO TABS: 2.0000 | ORAL_TABLET | ORAL | 0 refills | Status: DC

## 2018-09-21 MED ORDER — OXYCODONE-ACETAMINOPHEN 5-325 MG PO TABS: 1.0000 | ORAL_TABLET | ORAL | 0 refills | Status: DC | PRN

## 2018-09-22 ENCOUNTER — Telehealth: Payer: Self-pay

## 2018-09-22 NOTE — Telephone Encounter (Signed)
Contacted pt about c-section incision, no answer, left vm

## 2018-09-22 NOTE — Telephone Encounter (Signed)
Pt returned call, advised of proper incision care.

## 2018-09-25 ENCOUNTER — Encounter (HOSPITAL_COMMUNITY)
Admission: RE | Admit: 2018-09-25 | Discharge: 2018-09-25 | Disposition: A | Discharge status: Home / Self Care | Payer: Medicaid Other | Source: Ambulatory Visit | Attending: Obstetrics & Gynecology | Admitting: Obstetrics & Gynecology

## 2018-09-25 ENCOUNTER — Encounter: Payer: Medicaid Other | Admitting: Obstetrics and Gynecology

## 2018-10-02 ENCOUNTER — Ambulatory Visit (INDEPENDENT_AMBULATORY_CARE_PROVIDER_SITE_OTHER): Payer: Medicaid Other | Admitting: Obstetrics

## 2018-10-02 ENCOUNTER — Encounter: Payer: Self-pay | Admitting: Obstetrics

## 2018-10-02 DIAGNOSIS — Z1389 Encounter for screening for other disorder: Secondary | ICD-10-CM | POA: Diagnosis not present

## 2018-10-02 DIAGNOSIS — Z30013 Encounter for initial prescription of injectable contraceptive: Secondary | ICD-10-CM

## 2018-10-02 DIAGNOSIS — Z3009 Encounter for other general counseling and advice on contraception: Secondary | ICD-10-CM

## 2018-10-02 MED ORDER — MEDROXYPROGESTERONE ACETATE 150 MG/ML IM SUSP: 150.0000 mg | INTRAMUSCULAR | 4 refills | Status: DC

## 2018-10-02 NOTE — Progress Notes (Signed)
Subjective:     Amy Kim is a 33 y.o. female who presents for a postpartum visit. She is 2 weeks postpartum following a low cervical transverse Cesarean section. I have fully reviewed the prenatal and intrapartum course. The delivery was at 85 gestational weeks. Outcome: repeat cesarean section, low transverse incision. Anesthesia: spinal. Postpartum course has been normal. Baby's course has been normal. Baby is feeding by breast. Bleeding thin lochia. Bowel function is normal. Bladder function is normal. Patient is not sexually active. Contraception method is abstinence. Postpartum depression screening: negative.  Tobacco, alcohol and substance abuse history reviewed.  Adult immunizations reviewed including TDAP, rubella and varicella.  The following portions of the patient's history were reviewed and updated as appropriate: allergies, current medications, past family history, past medical history, past social history, past surgical history and problem list.  Review of Systems A comprehensive review of systems was negative.   Objective:    BP (!) 147/92   Pulse 82   Wt 197 lb 9.6 oz (89.6 kg)   LMP 12/27/2017 (Exact Date)   BMI 37.34 kg/m   General:  alert and no distress   Breasts:  inspection negative, no nipple discharge or bleeding, no masses or nodularity palpable  Lungs: clear to auscultation bilaterally  Heart:  regular rate and rhythm, S1, S2 normal, no murmur, click, rub or gallop  Abdomen: soft, non-tender; bowel sounds normal; no masses,  no organomegaly and incision is clean, dry and intact    50% of 15 min visit spent on counseling and coordination of care.   Assessment:     1. Postpartum care following cesarean delivery  2. Encounter for other general counseling and advice on contraception - wants Depo Provera  3. Encounter for initial prescription of injectable contraceptive Rx: - medroxyPROGESTERone (DEPO-PROVERA) 150 MG/ML injection; Inject 1 mL (150 mg  total) into the muscle every 3 (three) months.  Dispense: 1 mL; Refill: 4   Plan:    1. Contraception: Depo-Provera injections 2. Depo Provera Rx 3. Follow up in: 2 weeks or as needed.    [name] MD 10-02-2018

## 2018-10-16 ENCOUNTER — Ambulatory Visit: Payer: Medicaid Other | Admitting: Obstetrics & Gynecology

## 2018-10-28 ENCOUNTER — Ambulatory Visit (INDEPENDENT_AMBULATORY_CARE_PROVIDER_SITE_OTHER): Payer: Medicaid Other | Admitting: Obstetrics & Gynecology

## 2018-10-28 DIAGNOSIS — I10 Essential (primary) hypertension: Secondary | ICD-10-CM

## 2018-10-28 DIAGNOSIS — Z3042 Encounter for surveillance of injectable contraceptive: Secondary | ICD-10-CM | POA: Diagnosis not present

## 2018-10-28 DIAGNOSIS — Z3202 Encounter for pregnancy test, result negative: Secondary | ICD-10-CM | POA: Diagnosis not present

## 2018-10-28 LAB — POCT URINE PREGNANCY: Preg Test, Ur: NEGATIVE

## 2018-10-28 MED ORDER — MEDROXYPROGESTERONE ACETATE 150 MG/ML IM SUSP
150.0000 mg | Freq: Once | INTRAMUSCULAR | Status: AC
  Administered 2018-10-28: 150 mg via INTRAMUSCULAR

## 2018-10-28 MED ORDER — AMLODIPINE BESYLATE 5 MG PO TABS: 5.0000 mg | ORAL_TABLET | Freq: Every day | ORAL | 1 refills | Status: DC

## 2018-10-28 NOTE — Patient Instructions (Signed)
Acute Back Pain, Adult  Acute back pain is sudden and usually short-lived. It is often caused by an injury to the muscles and tissues in the back. The injury may result from:   A muscle or ligament getting overstretched or torn (strained). Ligaments are tissues that connect bones to each other. Lifting something improperly can cause a back strain.   Wear and tear (degeneration) of the spinal disks. Spinal disks are circular tissue that provides cushioning between the bones of the spine (vertebrae).   Twisting motions, such as while playing sports or doing yard work.   A hit to the back.   Arthritis.  You may have a physical exam, lab tests, and imaging tests to find the cause of your pain. Acute back pain usually goes away with rest and home care.  Follow these instructions at home:  Managing pain, stiffness, and swelling   Take over-the-counter and prescription medicines only as told by your health care provider.   Your health care provider may recommend applying ice during the first 24-48 hours after your pain starts. To do this:  ? Put ice in a plastic bag.  ? Place a towel between your skin and the bag.  ? Leave the ice on for 20 minutes, 2-3 times a day.   If directed, apply heat to the affected area as often as told by your health care provider. Use the heat source that your health care provider recommends, such as a moist heat pack or a heating pad.  ? Place a towel between your skin and the heat source.  ? Leave the heat on for 20-30 minutes.  ? Remove the heat if your skin turns bright red. This is especially important if you are unable to feel pain, heat, or cold. You have a greater risk of getting burned.  Activity     Do not stay in bed. Staying in bed for more than 1-2 days can delay your recovery.   Sit up and stand up straight. Avoid leaning forward when you sit, or hunching over when you stand.  ? If you work at a desk, sit close to it so you do not need to lean over. Keep your chin tucked  in. Keep your neck drawn back, and keep your elbows bent at a right angle. Your arms should look like the letter "L."  ? Sit high and close to the steering wheel when you drive. Add lower back (lumbar) support to your car seat, if needed.   Take short walks on even surfaces as soon as you are able. Try to increase the length of time you walk each day.   Do not sit, drive, or stand in one place for more than 30 minutes at a time. Sitting or standing for long periods of time can put stress on your back.   Do not drive or use heavy machinery while taking prescription pain medicine.   Use proper lifting techniques. When you bend and lift, use positions that put less stress on your back:  ? Bend your knees.  ? Keep the load close to your body.  ? Avoid twisting.   Exercise regularly as told by your health care provider. Exercising helps your back heal faster and helps prevent back injuries by keeping muscles strong and flexible.   Work with a physical therapist to make a safe exercise program, as recommended by your health care provider. Do any exercises as told by your physical therapist.  Lifestyle   Maintain   a healthy weight. Extra weight puts stress on your back and makes it difficult to have good posture.   Avoid activities or situations that make you feel anxious or stressed. Stress and anxiety increase muscle tension and can make back pain worse. Learn ways to manage anxiety and stress, such as through exercise.  General instructions   Sleep on a firm mattress in a comfortable position. Try lying on your side with your knees slightly bent. If you lie on your back, put a pillow under your knees.   Follow your treatment plan as told by your health care provider. This may include:  ? Cognitive or behavioral therapy.  ? Acupuncture or massage therapy.  ? Meditation or yoga.  Contact a health care provider if:   You have pain that is not relieved with rest or medicine.   You have increasing pain going down  into your legs or buttocks.   Your pain does not improve after 2 weeks.   You have pain at night.   You lose weight without trying.   You have a fever or chills.  Get help right away if:   You develop new bowel or bladder control problems.   You have unusual weakness or numbness in your arms or legs.   You develop nausea or vomiting.   You develop abdominal pain.   You feel faint.  Summary   Acute back pain is sudden and usually short-lived.   Use proper lifting techniques. When you bend and lift, use positions that put less stress on your back.   Take over-the-counter and prescription medicines and apply heat or ice as directed by your health care provider.  This information is not intended to replace advice given to you by your health care provider. Make sure you discuss any questions you have with your health care provider.  Document Released: 09/10/2005 Document Revised: 04/17/2018 Document Reviewed: 04/24/2017  Elsevier Interactive Patient Education  2019 [employer]

## 2018-10-28 NOTE — Progress Notes (Signed)
Post Partum Exam  Amy Kim is a 33 y.o. [id number] female who presents for a postpartum visit. She is 5 weeks postpartum following a low cervical transverse Cesarean section. I have fully reviewed the prenatal and intrapartum course. The delivery was at 57 gestational weeks.  Anesthesia: spinal. Postpartum course has been unremarkable. Baby's course has been unremarkable. Baby is feeding by breast. Bleeding no bleeding. Bowel function is normal. Bladder function is normal. Patient is not sexually active. Contraception method is Depo-Provera injections. Postpartum depression screening:neg (score 0)  The following portions of the patient's history were reviewed and updated as appropriate: allergies, current medications, past family history, past medical history, past social history, past surgical history and problem list. Last pap smear done 12/31/16 and was Normal  Review of Systems Pertinent items are noted in HPI.    Objective:  Last menstrual period 12/27/2017, unknown if currently breastfeeding.  General:  alert, cooperative and no distress   Breasts:  inspection negative, no nipple discharge or bleeding, no masses or nodularity palpable        Abdomen: soft, non-tender; bowel sounds normal; no masses,  no organomegaly and incision dry and intact   Vulva:  not evaluated  Vagina: not evaluated     back NT no deformity              Assessment:  5 week postpartum exam. Pap smear not done at today's visit.  Back pain Hypertension Plan:   1. Contraception: Depo-Provera injections 2. Norvasc for BP. RTC for BP check and seek primary care 3. Follow up in: 1 week or as needed.   Woodroe Mode, MD 10/28/2018

## 2018-11-10 ENCOUNTER — Ambulatory Visit (INDEPENDENT_AMBULATORY_CARE_PROVIDER_SITE_OTHER): Payer: Medicaid Other

## 2018-11-10 VITALS — BP 136/85 | HR 76

## 2018-11-10 DIAGNOSIS — Z0130 Encounter for examination of blood pressure without abnormal findings: Secondary | ICD-10-CM

## 2018-11-10 NOTE — Progress Notes (Signed)
Pt is here for BP check. Pt is prescribed Norvasc 5 mg once daily. BP reviewed today with Dr. Jodi Mourning who recommends pt continue on Norvasc daily and now follow up with PCP. Pt verbalized understanding.

## 2019-01-03 ENCOUNTER — Other Ambulatory Visit: Payer: Self-pay | Admitting: Obstetrics & Gynecology

## 2019-01-03 DIAGNOSIS — I10 Essential (primary) hypertension: Secondary | ICD-10-CM

## 2019-01-19 ENCOUNTER — Other Ambulatory Visit: Payer: Self-pay

## 2019-01-19 ENCOUNTER — Ambulatory Visit (INDEPENDENT_AMBULATORY_CARE_PROVIDER_SITE_OTHER): Payer: Medicaid Other

## 2019-01-19 VITALS — BP 136/93 | HR 80 | Ht 61.0 in | Wt 197.0 lb

## 2019-01-19 DIAGNOSIS — Z3042 Encounter for surveillance of injectable contraceptive: Secondary | ICD-10-CM

## 2019-01-19 MED ORDER — MEDROXYPROGESTERONE ACETATE 150 MG/ML IM SUSP
150.0000 mg | Freq: Once | INTRAMUSCULAR | Status: AC
  Administered 2019-01-19: 16:00:00 150 mg via INTRAMUSCULAR

## 2019-01-19 NOTE — Progress Notes (Signed)
Presents for DEPO, given in RD, tolerated well.  Next DEPO July 13-27/2020  Administrations This Visit    medroxyPROGESTERone (DEPO-PROVERA) injection 150 mg    Admin Date 01/19/2019 Action Given Dose 150 mg Route Intramuscular Administered By Tamela Oddi, RMA

## 2019-01-20 NOTE — Progress Notes (Signed)
I have reviewed the chart and agree with the nurse's note and plan of care for this visit.   Fatima Blank, CNM 5:13 PM

## 2019-01-28 NOTE — Progress Notes (Signed)
I have reviewed the chart and agree with nursing staff's documentation of this patient's encounter.  Fatima Blank, CNM 01/28/2019 7:19 PM

## 2019-02-10 ENCOUNTER — Other Ambulatory Visit: Payer: Self-pay | Admitting: Obstetrics & Gynecology

## 2019-02-10 DIAGNOSIS — I10 Essential (primary) hypertension: Secondary | ICD-10-CM

## 2019-04-09 ENCOUNTER — Ambulatory Visit: Payer: Medicaid Other

## 2019-04-16 ENCOUNTER — Ambulatory Visit (INDEPENDENT_AMBULATORY_CARE_PROVIDER_SITE_OTHER): Payer: Medicaid Other

## 2019-04-16 ENCOUNTER — Other Ambulatory Visit: Payer: Self-pay

## 2019-04-16 DIAGNOSIS — Z3042 Encounter for surveillance of injectable contraceptive: Secondary | ICD-10-CM | POA: Diagnosis not present

## 2019-04-16 MED ORDER — MEDROXYPROGESTERONE ACETATE 150 MG/ML IM SUSP
150.0000 mg | Freq: Once | INTRAMUSCULAR | Status: AC
  Administered 2019-04-16: 14:00:00 150 mg via INTRAMUSCULAR

## 2019-04-16 NOTE — Progress Notes (Signed)
Nurse visit for pt supply Depo Pt is on time for injection  Depo given R Del w/o complaints Depo due Oct 8-22, pt agrees

## 2019-07-06 ENCOUNTER — Ambulatory Visit (INDEPENDENT_AMBULATORY_CARE_PROVIDER_SITE_OTHER): Payer: Medicaid Other

## 2019-07-06 ENCOUNTER — Other Ambulatory Visit: Payer: Self-pay

## 2019-07-06 DIAGNOSIS — Z3042 Encounter for surveillance of injectable contraceptive: Secondary | ICD-10-CM | POA: Diagnosis not present

## 2019-07-06 MED ORDER — MEDROXYPROGESTERONE ACETATE 150 MG/ML IM SUSP
150.0000 mg | Freq: Once | INTRAMUSCULAR | Status: AC
  Administered 2019-07-06: 150 mg via INTRAMUSCULAR

## 2019-07-06 NOTE — Progress Notes (Signed)
Pt presents for depo inj. Pt is within her window. Injection given in Left deltoid. Pt tolerated well. Next depo due 12/28-1/11.

## 2019-07-06 NOTE — Progress Notes (Signed)
Patient seen and assessed by nursing staff during this encounter. I have reviewed the chart and agree with the documentation and plan.  Mora Bellman, MD 07/06/2019 3:49 PM

## 2019-08-15 ENCOUNTER — Other Ambulatory Visit: Payer: Self-pay

## 2019-08-15 DIAGNOSIS — Z20822 Contact with and (suspected) exposure to covid-19: Secondary | ICD-10-CM

## 2019-08-17 LAB — NOVEL CORONAVIRUS, NAA: SARS-CoV-2, NAA: DETECTED — AB

## 2019-09-23 ENCOUNTER — Ambulatory Visit: Payer: Medicaid Other

## 2019-09-25 DIAGNOSIS — K259 Gastric ulcer, unspecified as acute or chronic, without hemorrhage or perforation: Secondary | ICD-10-CM

## 2019-09-25 HISTORY — DX: Gastric ulcer, unspecified as acute or chronic, without hemorrhage or perforation: K25.9

## 2019-10-05 ENCOUNTER — Ambulatory Visit (INDEPENDENT_AMBULATORY_CARE_PROVIDER_SITE_OTHER): Payer: Medicaid Other

## 2019-10-05 ENCOUNTER — Other Ambulatory Visit: Payer: Self-pay

## 2019-10-05 VITALS — BP 138/81 | HR 99 | Ht 61.0 in | Wt 203.4 lb

## 2019-10-05 DIAGNOSIS — Z3042 Encounter for surveillance of injectable contraceptive: Secondary | ICD-10-CM

## 2019-10-05 MED ORDER — MEDROXYPROGESTERONE ACETATE 150 MG/ML IM SUSP
150.0000 mg | Freq: Once | INTRAMUSCULAR | Status: AC
  Administered 2019-10-05: 150 mg via INTRAMUSCULAR

## 2019-10-05 NOTE — Progress Notes (Addendum)
GYN presents for DEPO, given in RD, tolerated well.  Needs Annual Exam before next DEPO.  Next DEPO March 29 - January 04, 2020  Administrations This Visit    medroxyPROGESTERone (DEPO-PROVERA) injection 150 mg    Admin Date 10/05/2019 Action Given Dose 150 mg Route Intramuscular Administered By Tamela Oddi, RMA

## 2019-11-09 ENCOUNTER — Ambulatory Visit: Payer: Medicaid Other

## 2019-12-17 ENCOUNTER — Ambulatory Visit (INDEPENDENT_AMBULATORY_CARE_PROVIDER_SITE_OTHER): Payer: Medicaid Other | Admitting: Internal Medicine

## 2019-12-17 DIAGNOSIS — Z7689 Persons encountering health services in other specified circumstances: Secondary | ICD-10-CM

## 2019-12-17 DIAGNOSIS — I10 Essential (primary) hypertension: Secondary | ICD-10-CM

## 2019-12-17 MED ORDER — AMLODIPINE BESYLATE 5 MG PO TABS: 5.0000 mg | ORAL_TABLET | Freq: Every day | ORAL | 3 refills | Status: DC

## 2019-12-17 NOTE — Progress Notes (Signed)
Virtual Visit via Telephone Note  I connected with Amy Kim, on 12/17/2019 at 3:32 PM by telephone due to the COVID-19 pandemic and verified that I am speaking with the correct person using two identifiers.   Consent: I discussed the limitations, risks, security and privacy concerns of performing an evaluation and management service by telephone and the availability of in person appointments. I also discussed with the patient that there may be a patient responsible charge related to this service. The patient expressed understanding and agreed to proceed.   Location of Patient: Home   Location of Provider: Clinic    Persons participating in Telemedicine visit: Abagail Aleine Durall [facility] [name]      History of Present Illness: Patient has a visit to establish care. History of 4 C-sections and h/o cholecystectomy.    Chronic HTN Disease Monitoring:  Home BP Monitoring - Does not monitor.  Chest pain- no  Dyspnea- no Headache - no  Medications: Amlodipine 5 mg  Compliance- yes Lightheadedness- no  Edema- no   Receives Depo Provera for contraception. Go[facility] to [facility] this.     Past Medical History:  Diagnosis Date  . Back pain    DDD  . History of migraine    last one 2 days ago  . Pregnancy induced hypertension   . Urinary frequency    No Known Allergies  Current Outpatient Medications on File Prior to Visit  Medication Sig Dispense Refill  . amLODipine (NORVASC) 5 MG tablet Take 1 tablet by mouth once daily 30 tablet 0  . medroxyPROGESTERone (DEPO-PROVERA) 150 MG/ML injection Inject 1 mL (150 mg total) into the muscle every 3 (three) months. 1 mL 4  . Prenat-Methylfol-Chol-Fish Oil (PRENATAL + COMPLETE MULTI PO) Take 1 tablet by mouth daily.      No current facility-administered medications on file prior to visit.    Observations/Objective: NAD. Speaking clearly.  Work of breathing normal.  Alert and oriented. Mood  appropriate.   Assessment and Plan: 1. Encounter to establish care  2. Essential hypertension Does not monitor BP at home. Prior BP trend mildly elevated. Will plan to monitor at upcoming annual exam.  Counseled on blood pressure goal of less than 130/80, low-sodium, DASH diet, medication compliance, 150 minutes of moderate intensity exercise per week. Discussed medication compliance, adverse effects. - amLODipine (NORVASC) 5 MG tablet; Take 1 tablet (5 mg total) by mouth daily.  Dispense: 30 tablet; Refill: 3   Follow Up Instructions: Annual with PAP    I discussed the assessment and treatment plan with the patient. The patient was provided an opportunity to ask questions and all were answered. The patient agreed with the plan and demonstrated an understanding of the instructions.   The patient was advised to call back or seek an in-person evaluation if the symptoms worsen or if the condition fails to improve as anticipated.  I provided 10 minutes total of non-face-to-face time during this encounter including median intraservice time, reviewing previous notes, investigations, ordering medications, medical decision making, coordinating care and patient verbalized understanding at the end of the visit.    Phill Myron, D.O. Primary Care at [facility]  12/17/2019, 3:32 PM

## 2019-12-28 ENCOUNTER — Other Ambulatory Visit: Payer: Self-pay

## 2019-12-28 ENCOUNTER — Other Ambulatory Visit: Payer: Self-pay | Admitting: Obstetrics

## 2019-12-28 ENCOUNTER — Ambulatory Visit (INDEPENDENT_AMBULATORY_CARE_PROVIDER_SITE_OTHER): Payer: Medicaid Other

## 2019-12-28 DIAGNOSIS — Z3042 Encounter for surveillance of injectable contraceptive: Secondary | ICD-10-CM | POA: Diagnosis not present

## 2019-12-28 DIAGNOSIS — Z30013 Encounter for initial prescription of injectable contraceptive: Secondary | ICD-10-CM

## 2019-12-28 MED ORDER — MEDROXYPROGESTERONE ACETATE 150 MG/ML IM SUSP
150.0000 mg | INTRAMUSCULAR | Status: DC
  Administered 2019-12-28 – 2021-02-28 (×4): 150 mg via INTRAMUSCULAR

## 2019-12-28 MED ORDER — MEDROXYPROGESTERONE ACETATE 150 MG/ML IM SUSP: 150.0000 mg | INTRAMUSCULAR | 0 refills | Status: DC

## 2019-12-28 NOTE — Progress Notes (Signed)
Patient ID: Amy Kim, female   DOB: 1986/08/28, 34 y.o.   MRN: [id number] Patient seen and assessed by nursing staff during this encounter. I have reviewed the chart and agree with the documentation and plan. I have also made any necessary editorial changes.  Emeterio Reeve, MD 12/28/2019 4:19 PM

## 2019-12-28 NOTE — Progress Notes (Signed)
Pt presents for Depo injection.  Last Injection: 10/05/19  Next Depo Due: 03/14/20-03/28/20  Given w/o any problems. LD

## 2019-12-30 ENCOUNTER — Telehealth: Payer: Self-pay

## 2019-12-30 NOTE — Patient Instructions (Signed)

## 2019-12-30 NOTE — Telephone Encounter (Signed)
Called patient to do their pre-visit COVID screening.  Call went to voicemail. Unable to do prescreening.  

## 2019-12-31 ENCOUNTER — Ambulatory Visit (INDEPENDENT_AMBULATORY_CARE_PROVIDER_SITE_OTHER): Payer: Self-pay | Admitting: Internal Medicine

## 2019-12-31 ENCOUNTER — Other Ambulatory Visit (HOSPITAL_COMMUNITY)
Admission: RE | Admit: 2019-12-31 | Discharge: 2019-12-31 | Disposition: A | Discharge status: Home / Self Care | Payer: Medicaid Other | Source: Ambulatory Visit | Attending: Internal Medicine | Admitting: Internal Medicine

## 2019-12-31 ENCOUNTER — Other Ambulatory Visit: Payer: Self-pay

## 2019-12-31 ENCOUNTER — Encounter: Payer: Self-pay | Admitting: Internal Medicine

## 2019-12-31 VITALS — BP 127/86 | HR 78 | Temp 97.3°F | Resp 17 | Wt 201.0 lb

## 2019-12-31 DIAGNOSIS — L02212 Cutaneous abscess of back [any part, except buttock]: Secondary | ICD-10-CM

## 2019-12-31 DIAGNOSIS — Z131 Encounter for screening for diabetes mellitus: Secondary | ICD-10-CM

## 2019-12-31 DIAGNOSIS — Z0000 Encounter for general adult medical examination without abnormal findings: Secondary | ICD-10-CM | POA: Insufficient documentation

## 2019-12-31 DIAGNOSIS — Z113 Encounter for screening for infections with a predominantly sexual mode of transmission: Secondary | ICD-10-CM | POA: Diagnosis not present

## 2019-12-31 DIAGNOSIS — Z124 Encounter for screening for malignant neoplasm of cervix: Secondary | ICD-10-CM

## 2019-12-31 MED ORDER — DOXYCYCLINE HYCLATE 100 MG PO TABS: 100.0000 mg | ORAL_TABLET | Freq: Two times a day (BID) | ORAL | 0 refills | Status: DC

## 2019-12-31 NOTE — Progress Notes (Signed)
Subjective:    Amy Kim - 34 y.o. female MRN [id number]  Date of birth: [birth year]  HPI  Amy Kim is here for annual exam. No history of abnormal PAPs. Accepts STD screening.   Also concerned about an area on her back that has been draining for a couple months. She isn't sure what it is and would like me to take a look at it.      Health Maintenance:  Health Maintenance Due  Topic Date Due  . PAP SMEAR-Modifier  01/01/2020    -  reports that she has never smoked. She has never used smokeless tobacco. - Review of Systems: Per HPI. - Past Medical History: Patient Active Problem List   Diagnosis Date Noted  . Essential hypertension 12/17/2019  . Refusal of blood transfusions as patient is Jehovah's Witness 09/18/2018  . Abdominal cyst 03/29/2015  . History of C-section 03/24/2015   - Medications: reviewed and updated   Objective:   Physical Exam BP 127/86   Pulse 78   Temp (!) 97.3 F (36.3 C) (Temporal)   Resp 17   Wt 201 lb (91.2 kg)   SpO2 97%   BMI 37.98 kg/m  Physical Exam  Constitutional: She is oriented to person, place, and time and well-developed, well-nourished, and in no distress.  HENT:  Head: Normocephalic and atraumatic.  Mouth/Throat: Oropharynx is clear and moist.  Eyes: Pupils are equal, round, and reactive to light. Conjunctivae and EOM are normal.  Neck: No thyromegaly present.  Cardiovascular: Normal rate, regular rhythm, normal heart sounds and intact distal pulses.  No murmur heard. Pulmonary/Chest: Effort normal and breath sounds normal. No respiratory distress. She has no wheezes.  Abdominal: Soft. Bowel sounds are normal. She exhibits no distension. There is no abdominal tenderness. There is no rebound and no guarding.  Genitourinary:    Genitourinary Comments: GU/GYN: Exam performed in the presence of a chaperone. External genitalia within normal limits.  Vaginal mucosa pink, moist, normal rugae.  Nonfriable cervix without  lesions, no discharge or bleeding noted on speculum exam.  Bimanual exam revealed normal, nongravid uterus.  No cervical motion tenderness. No adnexal masses bilaterally.     Musculoskeletal:        General: No deformity or edema. Normal range of motion.     Cervical back: Normal range of motion and neck supple.  Lymphadenopathy:    She has no cervical adenopathy.  Neurological: She is alert and oriented to person, place, and time. Gait normal.  Skin: Skin is warm and dry. No rash noted. She is not diaphoretic.  On left upper back, small area of induration and erythema with central area of drainage. With expression, initially drained large amounts of pus then transitioned to serosanguineous fluid.   Psychiatric: Mood, affect and judgment normal.           Assessment & Plan:   1. Annual physical exam Counseled on 150 minutes of exercise per week, healthy eating (including decreased daily intake of saturated fats, cholesterol, added sugars, sodium), STI prevention, routine healthcare maintenance. - CBC with Differential - Comprehensive metabolic panel - Lipid Panel  2. Pap smear for cervical cancer screening - Cytology - PAP([place])  3. Screening for STDs (sexually transmitted diseases) - Cervicovaginal ancillary only - RPR  4. Screening for diabetes mellitus (DM) - Hemoglobin A1c  5. Cutaneous abscess of back excluding buttocks Area consistent with abscess. There is an area of induration appreciated but no fluctuance. Given long term symptomatology, offered option of  trying to break up some loculations within the skin to encourage further drainage as well as possible small I&D. Patient declined for now. Will trial antibiotic given surrounding soft tissue erythema and warm compresses to encourage further expression. Discussed that if does not improve with these measures, would need return appointment for possible procedure.  - doxycycline (VIBRA-TABS) 100 MG tablet; Take 1  tablet (100 mg total) by mouth 2 (two) times daily.  Dispense: 20 tablet; Refill: 0    Phill Myron, D.O. 12/31/2019, 10:40 AM Primary Care at [facility]

## 2020-01-01 LAB — COMPREHENSIVE METABOLIC PANEL
ALT: 19 IU/L (ref 0–32)
AST: 14 IU/L (ref 0–40)
Albumin/Globulin Ratio: 1.4 (ref 1.2–2.2)
Albumin: 4.4 g/dL (ref 3.8–4.8)
Alkaline Phosphatase: 90 IU/L (ref 39–117)
BUN/Creatinine Ratio: 19 (ref 9–23)
BUN: 13 mg/dL (ref 6–20)
Bilirubin Total: 0.2 mg/dL (ref 0.0–1.2)
CO2: 19 mmol/L — ABNORMAL LOW (ref 20–29)
Calcium: 9.7 mg/dL (ref 8.7–10.2)
Chloride: 106 mmol/L (ref 96–106)
Creatinine, Ser: 0.69 mg/dL (ref 0.57–1.00)
GFR calc Af Amer: 132 mL/min/{1.73_m2} (ref >59)
GFR calc non Af Amer: 115 mL/min/{1.73_m2} (ref >59)
Globulin, Total: 3.2 g/dL (ref 1.5–4.5)
Glucose: 112 mg/dL — ABNORMAL HIGH (ref 65–99)
Potassium: 4.3 mmol/L (ref 3.5–5.2)
Sodium: 140 mmol/L (ref 134–144)
Total Protein: 7.6 g/dL (ref 6.0–8.5)

## 2020-01-01 LAB — HEMOGLOBIN A1C
Est. average glucose Bld gHb Est-mCnc: 151 mg/dL
Hgb A1c MFr Bld: 6.9 % — ABNORMAL HIGH (ref 4.8–5.6)

## 2020-01-01 LAB — CYTOLOGY - PAP
Comment: NEGATIVE
Diagnosis: NEGATIVE
High risk HPV: NEGATIVE

## 2020-01-01 LAB — CBC WITH DIFFERENTIAL/PLATELET
Basophils Absolute: 0 10*3/uL (ref 0.0–0.2)
Basos: 1 %
EOS (ABSOLUTE): 0.1 10*3/uL (ref 0.0–0.4)
Eos: 1 %
Hematocrit: 43.1 % (ref 34.0–46.6)
Hemoglobin: 13.8 g/dL (ref 11.1–15.9)
Immature Grans (Abs): 0 10*3/uL (ref 0.0–0.1)
Immature Granulocytes: 0 %
Lymphocytes Absolute: 2.3 10*3/uL (ref 0.7–3.1)
Lymphs: 26 %
MCH: 26 pg — ABNORMAL LOW (ref 26.6–33.0)
MCHC: 32 g/dL (ref 31.5–35.7)
MCV: 81 fL (ref 79–97)
Monocytes Absolute: 0.4 10*3/uL (ref 0.1–0.9)
Monocytes: 4 %
Neutrophils Absolute: 5.8 10*3/uL (ref 1.4–7.0)
Neutrophils: 68 %
Platelets: 545 10*3/uL — ABNORMAL HIGH (ref 150–450)
RBC: 5.31 x10E6/uL — ABNORMAL HIGH (ref 3.77–5.28)
RDW: 13.3 % (ref 11.7–15.4)
WBC: 8.7 10*3/uL (ref 3.4–10.8)

## 2020-01-01 LAB — CERVICOVAGINAL ANCILLARY ONLY
Bacterial Vaginitis (gardnerella): NEGATIVE
Candida Glabrata: NEGATIVE
Candida Vaginitis: NEGATIVE
Chlamydia: NEGATIVE
Comment: NEGATIVE
Comment: NEGATIVE
Comment: NEGATIVE
Comment: NEGATIVE
Comment: NEGATIVE
Comment: NORMAL
Neisseria Gonorrhea: NEGATIVE
Trichomonas: NEGATIVE

## 2020-01-01 LAB — LIPID PANEL
Chol/HDL Ratio: 4 ratio (ref 0.0–4.4)
Cholesterol, Total: 193 mg/dL (ref 100–199)
HDL: 48 mg/dL (ref >39)
LDL Chol Calc (NIH): 133 mg/dL — ABNORMAL HIGH (ref 0–99)
Triglycerides: 66 mg/dL (ref 0–149)
VLDL Cholesterol Cal: 12 mg/dL (ref 5–40)

## 2020-01-01 LAB — HIV ANTIBODY (ROUTINE TESTING W REFLEX): HIV Screen 4th Generation wRfx: NONREACTIVE

## 2020-01-01 LAB — RPR: RPR Ser Ql: NONREACTIVE

## 2020-01-05 ENCOUNTER — Other Ambulatory Visit: Payer: Self-pay

## 2020-01-05 ENCOUNTER — Other Ambulatory Visit: Payer: Self-pay | Admitting: Internal Medicine

## 2020-01-05 DIAGNOSIS — D473 Essential (hemorrhagic) thrombocythemia: Secondary | ICD-10-CM

## 2020-01-05 DIAGNOSIS — D75839 Thrombocytosis, unspecified: Secondary | ICD-10-CM

## 2020-01-05 DIAGNOSIS — E119 Type 2 diabetes mellitus without complications: Secondary | ICD-10-CM

## 2020-01-06 NOTE — Progress Notes (Signed)
Patient notified of results & recommendations. Expressed understanding. Unable to add-on labs. Made a follow up appointment for 01/13/2020 for DM management & additional labs.

## 2020-01-08 ENCOUNTER — Other Ambulatory Visit: Payer: Self-pay | Admitting: Internal Medicine

## 2020-01-08 MED ORDER — SULFAMETHOXAZOLE-TRIMETHOPRIM 800-160 MG PO TABS: 1.0000 | ORAL_TABLET | Freq: Two times a day (BID) | ORAL | 0 refills | Status: DC

## 2020-01-12 ENCOUNTER — Telehealth: Payer: Self-pay

## 2020-01-12 NOTE — Telephone Encounter (Signed)
Called patient to do their pre-visit COVID screening.  Call went to voicemail. Unable to do prescreening.  

## 2020-01-13 ENCOUNTER — Ambulatory Visit (INDEPENDENT_AMBULATORY_CARE_PROVIDER_SITE_OTHER): Payer: Self-pay | Admitting: Internal Medicine

## 2020-01-13 ENCOUNTER — Encounter: Payer: Medicaid Other | Admitting: Internal Medicine

## 2020-01-13 ENCOUNTER — Other Ambulatory Visit: Payer: Self-pay

## 2020-01-13 VITALS — BP 120/84 | HR 83 | Temp 97.3°F | Resp 17 | Wt 201.0 lb

## 2020-01-13 DIAGNOSIS — E119 Type 2 diabetes mellitus without complications: Secondary | ICD-10-CM | POA: Insufficient documentation

## 2020-01-13 DIAGNOSIS — D473 Essential (hemorrhagic) thrombocythemia: Secondary | ICD-10-CM

## 2020-01-13 DIAGNOSIS — D75839 Thrombocytosis, unspecified: Secondary | ICD-10-CM

## 2020-01-13 MED ORDER — METFORMIN HCL 500 MG PO TABS: 500.0000 mg | ORAL_TABLET | Freq: Every day | ORAL | 1 refills | Status: DC

## 2020-01-13 NOTE — Patient Instructions (Signed)

## 2020-01-13 NOTE — Progress Notes (Signed)
  Subjective:    Amy Kim - 34 y.o. female MRN [id number]  Date of birth: [birth year]  HPI  Amy Kim is here for follow up of new diagnosis of DM. She has no concerns about diagnosis today. Is willing to see a nutritionist. Has some concerns about Metformin because her aunt and mother took the medication and had a lot of side effects and issues.      Health Maintenance:  Health Maintenance Due  Topic Date Due  . URINE MICROALBUMIN  Never done  . COVID-19 Vaccine (1) Never done    -  reports that she has never smoked. She has never used smokeless tobacco. - Review of Systems: Per HPI. - Past Medical History: Patient Active Problem List   Diagnosis Date Noted  . Essential hypertension 12/17/2019  . Refusal of blood transfusions as patient is Jehovah's Witness 09/18/2018  . Abdominal cyst 03/29/2015  . History of C-section 03/24/2015   - Medications: reviewed and updated   Objective:   Physical Exam BP 120/84   Pulse 83   Temp (!) 97.3 F (36.3 C) (Temporal)   Resp 17   Wt 201 lb (91.2 kg)   SpO2 97%   BMI 37.98 kg/m  Physical Exam  Constitutional: She is oriented to person, place, and time and well-developed, well-nourished, and in no distress. No distress.  Cardiovascular: Normal rate.  Pulmonary/Chest: Effort normal. No respiratory distress.  Musculoskeletal:        General: Normal range of motion.  Neurological: She is alert and oriented to person, place, and time.  Skin: Skin is warm and dry. She is not diaphoretic.  Psychiatric: Affect and judgment normal.           Assessment & Plan:   1. New onset type 2 diabetes mellitus (HCC) A1c 6.9%. Discussed benefits, risks, and side effects of metformin. Patient is agreeable to try for at least one week. Will start on 500 mg daily and increase as needed/tolerated. Will plan to repeat A1c in a little less than 3 months. Discussed carb modified diet and importance of exercise.   -  Microalbumin/Creatinine Ratio, Urine - metFORMIN (GLUCOPHAGE) 500 MG tablet; Take 1 tablet (500 mg total) by mouth daily with breakfast.  Dispense: 90 tablet; Refill: 1 - Amb ref to Medical Nutrition Therapy-MNT  2. Thrombocytosis ([place]) Seen on previous labs with elevated RBCs. Follow up labs ordered.  - CBC with Differential - Iron, TIBC and Ferritin Panel - Pathologist smear review     Phill Myron, D.O. 01/13/2020, 4:13 PM Primary Care at [facility]

## 2020-01-14 LAB — IRON,TIBC AND FERRITIN PANEL
Ferritin: 89 ng/mL (ref 15–150)
Iron Saturation: 18 % (ref 15–55)
Iron: 55 ug/dL (ref 27–159)
Total Iron Binding Capacity: 299 ug/dL (ref 250–450)
UIBC: 244 ug/dL (ref 131–425)

## 2020-01-14 LAB — CBC WITH DIFFERENTIAL/PLATELET
Basophils Absolute: 0 10*3/uL (ref 0.0–0.2)
Basos: 0 %
EOS (ABSOLUTE): 0.2 10*3/uL (ref 0.0–0.4)
Eos: 2 %
Hematocrit: 40.6 % (ref 34.0–46.6)
Hemoglobin: 13.2 g/dL (ref 11.1–15.9)
Immature Grans (Abs): 0 10*3/uL (ref 0.0–0.1)
Immature Granulocytes: 0 %
Lymphocytes Absolute: 2.8 10*3/uL (ref 0.7–3.1)
Lymphs: 25 %
MCH: 26 pg — ABNORMAL LOW (ref 26.6–33.0)
MCHC: 32.5 g/dL (ref 31.5–35.7)
MCV: 80 fL (ref 79–97)
Monocytes Absolute: 0.7 10*3/uL (ref 0.1–0.9)
Monocytes: 6 %
Neutrophils Absolute: 7.2 10*3/uL — ABNORMAL HIGH (ref 1.4–7.0)
Neutrophils: 67 %
Platelets: 523 10*3/uL — ABNORMAL HIGH (ref 150–450)
RBC: 5.08 x10E6/uL (ref 3.77–5.28)
RDW: 13.2 % (ref 11.7–15.4)
WBC: 11 10*3/uL — ABNORMAL HIGH (ref 3.4–10.8)

## 2020-01-14 LAB — MICROALBUMIN / CREATININE URINE RATIO
Creatinine, Urine: 107 mg/dL
Microalb/Creat Ratio: 14 mg/g creat (ref 0–29)
Microalbumin, Urine: 14.5 ug/mL

## 2020-01-15 LAB — PATHOLOGIST SMEAR REVIEW

## 2020-01-18 ENCOUNTER — Other Ambulatory Visit: Payer: Self-pay | Admitting: Internal Medicine

## 2020-01-18 ENCOUNTER — Telehealth: Payer: Self-pay | Admitting: Hematology and Oncology

## 2020-01-18 DIAGNOSIS — D75839 Thrombocytosis, unspecified: Secondary | ICD-10-CM

## 2020-01-18 DIAGNOSIS — D473 Essential (hemorrhagic) thrombocythemia: Secondary | ICD-10-CM

## 2020-01-18 NOTE — Telephone Encounter (Signed)
Received a new hem referral from [name] for thrombocytosis. Amy Kim has been cld and scheduled to see Dr. Alvy Bimler o 4/30 at 1030am.

## 2020-01-18 NOTE — Progress Notes (Signed)
Patient notified of results & recommendations. Expressed understanding.

## 2020-01-22 ENCOUNTER — Other Ambulatory Visit: Payer: Self-pay

## 2020-01-22 ENCOUNTER — Inpatient Hospital Stay: Payer: Self-pay | Attending: Hematology and Oncology | Admitting: Hematology and Oncology

## 2020-01-22 ENCOUNTER — Encounter: Payer: Self-pay | Admitting: Hematology and Oncology

## 2020-01-22 DIAGNOSIS — D72829 Elevated white blood cell count, unspecified: Secondary | ICD-10-CM | POA: Insufficient documentation

## 2020-01-22 DIAGNOSIS — D473 Essential (hemorrhagic) thrombocythemia: Secondary | ICD-10-CM

## 2020-01-22 DIAGNOSIS — Z79899 Other long term (current) drug therapy: Secondary | ICD-10-CM | POA: Insufficient documentation

## 2020-01-22 DIAGNOSIS — D75839 Thrombocytosis, unspecified: Secondary | ICD-10-CM

## 2020-01-22 DIAGNOSIS — L089 Local infection of the skin and subcutaneous tissue, unspecified: Secondary | ICD-10-CM | POA: Insufficient documentation

## 2020-01-22 NOTE — Assessment & Plan Note (Signed)
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

## 2020-01-22 NOTE — Progress Notes (Signed)
[name] NOTE  Patient Care Team: [name], DO as PCP - General (Family Medicine) Woodroe Mode, MD as Consulting Physician (Obstetrics and Gynecology)  CHIEF COMPLAINTS/PURPOSE OF CONSULTATION:  Chronic intermittent leukocytosis and thrombocytosis  HISTORY OF PRESENTING ILLNESS:  Amy Kim 34 y.o. female is here because of elevated WBC and platelet count.  She was found to have abnormal CBC from blood work that was being drawn at her primary care doctor's office I have the opportunity to review his CBC dated back from August 2019. Her white blood cell count ranged from normal at 8.6 to as high as 12.3 Her platelet count ranges from 304-545 She has frequent, recurrent skin infection affecting both axillary region on a regular basis, typically related to changes in her menstrual cycle She just completed a course of antibiotics approximately 4 days ago for a very significant skin infection on her back Currently, she does not have symptoms of sinus congestion, cough, urinary frequency/urgency or dysuria, diarrhea, joint swelling/pain or abnormal skin rash.  She had no prior history or diagnosis of cancer. Her age appropriate screening programs are up-to-date. She does not smoke She denies prior history of blood clots  MEDICAL HISTORY:  Past Medical History:  Diagnosis Date  . Back pain    DDD  . History of migraine    last one 2 days ago  . Pregnancy induced hypertension   . Urinary frequency     SURGICAL HISTORY: Past Surgical History:  Procedure Laterality Date  . CESAREAN SECTION     x 3   . CESAREAN SECTION N/A 08/11/2015   Procedure: REPEAT CESAREAN SECTION;  Surgeon: Truett Mainland, DO;  Location: [facility];  Service: Obstetrics;  Laterality: N/A;  . CESAREAN SECTION N/A 09/19/2018   Procedure: CESAREAN SECTION;  Surgeon: Osborne Oman, MD;  Location: [facility];  Service: Obstetrics;  Laterality: N/A;  .  CHOLECYSTECTOMY N/A 03/21/2016   Procedure: LAPAROSCOPIC CHOLECYSTECTOMY;  Surgeon: Ralene Ok, MD;  Location: [facility];  Service: General;  Laterality: N/A;    SOCIAL HISTORY: Social History   Socioeconomic History  . Marital status: Married    Spouse name: Not on file  . Number of children: Not on file  . Years of education: Not on file  . Highest education level: Not on file  Occupational History  . Not on file  Tobacco Use  . Smoking status: Never Smoker  . Smokeless tobacco: Never Used  Substance and Sexual Activity  . Alcohol use: Not Currently    Comment: occaionally  . Drug use: No  . Sexual activity: Yes    Birth control/protection: None  Other Topics Concern  . Not on file  Social History Narrative  . Not on file   Social Determinants of Health   Financial Resource Strain:   . Difficulty of Paying Living Expenses:   Food Insecurity:   . Worried About Charity fundraiser in the Last Year:   . Arboriculturist in the Last Year:   Transportation Needs:   . Film/video editor (Medical):   Marland Kitchen Lack of Transportation (Non-Medical):   Physical Activity:   . Days of Exercise per Week:   . Minutes of Exercise per Session:   Stress:   . Feeling of Stress :   Social Connections:   . Frequency of Communication with Friends and Family:   . Frequency of Social Gatherings with Friends and Family:   . Attends Religious  Services:   . Active Member of Clubs or Organizations:   . Attends Archivist Meetings:   Marland Kitchen Marital Status:   Intimate Partner Violence:   . Fear of Current or Ex-Partner:   . Emotionally Abused:   Marland Kitchen Physically Abused:   . Sexually Abused:     FAMILY HISTORY: Family History  Problem Relation Age of Onset  . Hypertension Mother   . Hypertension Father   . Heart disease Paternal Grandmother     ALLERGIES:  has No Known Allergies.  MEDICATIONS:  Current Outpatient Medications  Medication Sig Dispense Refill  . amLODipine  (NORVASC) 5 MG tablet Take 1 tablet (5 mg total) by mouth daily. 30 tablet 3  . medroxyPROGESTERone (DEPO-PROVERA) 150 MG/ML injection Inject 1 mL (150 mg total) into the muscle every 3 (three) months. 1 mL 0  . metFORMIN (GLUCOPHAGE) 500 MG tablet Take 1 tablet (500 mg total) by mouth daily with breakfast. 90 tablet 1   Current Facility-Administered Medications  Medication Dose Route Frequency Provider Last Rate Last Admin  . medroxyPROGESTERone (DEPO-PROVERA) injection 150 mg  150 mg Intramuscular Q90 days [name], MD   150 mg at 12/28/19 1614    REVIEW OF SYSTEMS:   Constitutional: Denies fevers, chills or abnormal night sweats Eyes: Denies blurriness of vision, double vision or watery eyes Ears, nose, mouth, throat, and face: Denies mucositis or sore throat Respiratory: Denies cough, dyspnea or wheezes Cardiovascular: Denies palpitation, chest discomfort or lower extremity swelling Gastrointestinal:  Denies nausea, heartburn or change in bowel habits Lymphatics: Denies new lymphadenopathy or easy bruising Neurological:Denies numbness, tingling or new weaknesses Behavioral/Psych: Mood is stable, no new changes  All other systems were reviewed with the patient and are negative.  PHYSICAL EXAMINATION: ECOG PERFORMANCE STATUS: 0 - Asymptomatic  Vitals:   01/22/20 1041  BP: 132/84  Pulse: 100  Resp: 18  Temp: 98.9 F (37.2 C)  SpO2: 98%   Filed Weights   01/22/20 1041  Weight: 204 lb 3.2 oz (92.6 kg)    GENERAL:alert, no distress and comfortable SKIN: The skin infection on her back has healed.  Noted scarring on both axilla EYES: normal, conjunctiva are pink and non-injected, sclera clear OROPHARYNX:no exudate, no erythema and lips, buccal mucosa, and tongue normal  NECK: supple, thyroid normal size, non-tender, without nodularity LYMPH:  no palpable lymphadenopathy in the cervical, axillary or inguinal LUNGS: clear to auscultation and percussion with normal  breathing effort HEART: regular rate & rhythm and no murmurs and no lower extremity edema ABDOMEN:abdomen soft, non-tender and normal bowel sounds Musculoskeletal:no cyanosis of digits and no clubbing  PSYCH: alert & oriented x 3 with fluent speech NEURO: no focal motor/sensory deficits  LABORATORY DATA:  I have reviewed the data as listed Recent Results (from the past 2160 hour(s))  Cytology - PAP([place])     Status: None   Collection Time: 12/31/19 10:51 AM  Result Value Ref Range   High risk HPV Negative    Adequacy      Satisfactory for evaluation; transformation zone component PRESENT.   Diagnosis      - Negative for intraepithelial lesion or malignancy (NILM)   Comment Normal Reference Range HPV - Negative   Cervicovaginal ancillary only     Status: None   Collection Time: 12/31/19 10:51 AM  Result Value Ref Range   Neisseria Gonorrhea Negative    Chlamydia Negative    Trichomonas Negative    Bacterial Vaginitis (gardnerella) Negative  Candida Vaginitis Negative    Candida Glabrata Negative    Comment      Normal Reference Range Bacterial Vaginosis - Negative   Comment Normal Reference Range Candida Species - Negative    Comment Normal Reference Range Candida Galbrata - Negative    Comment Normal Reference Range Trichomonas - Negative    Comment Normal Reference Ranger Chlamydia - Negative    Comment      Normal Reference Range Neisseria Gonorrhea - Negative  CBC with Differential     Status: Abnormal   Collection Time: 12/31/19 10:58 AM  Result Value Ref Range   WBC 8.7 3.4 - 10.8 x10E3/uL   RBC 5.31 (H) 3.77 - 5.28 x10E6/uL   Hemoglobin 13.8 11.1 - 15.9 g/dL   Hematocrit 43.1 34.0 - 46.6 %   MCV 81 79 - 97 fL   MCH 26.0 (L) 26.6 - 33.0 pg   MCHC 32.0 31.5 - 35.7 g/dL   RDW 13.3 11.7 - 15.4 %   Platelets 545 (H) 150 - 450 x10E3/uL   Neutrophils 68 Not Estab. %   Lymphs 26 Not Estab. %   Monocytes 4 Not Estab. %   Eos 1 Not Estab. %   Basos 1 Not  Estab. %   Neutrophils Absolute 5.8 1.4 - 7.0 x10E3/uL   Lymphocytes Absolute 2.3 0.7 - 3.1 x10E3/uL   Monocytes Absolute 0.4 0.1 - 0.9 x10E3/uL   EOS (ABSOLUTE) 0.1 0.0 - 0.4 x10E3/uL   Basophils Absolute 0.0 0.0 - 0.2 x10E3/uL   Immature Granulocytes 0 Not Estab. %   Immature Grans (Abs) 0.0 0.0 - 0.1 x10E3/uL  Comprehensive metabolic panel     Status: Abnormal   Collection Time: 12/31/19 10:58 AM  Result Value Ref Range   Glucose 112 (H) 65 - 99 mg/dL   BUN 13 6 - 20 mg/dL   Creatinine, Ser 0.69 0.57 - 1.00 mg/dL   GFR calc non Af Amer 115 >59 mL/min/1.73   GFR calc Af Amer 132 >59 mL/min/1.73   BUN/Creatinine Ratio 19 9 - 23   Sodium 140 134 - 144 mmol/L   Potassium 4.3 3.5 - 5.2 mmol/L   Chloride 106 96 - 106 mmol/L   CO2 19 (L) 20 - 29 mmol/L   Calcium 9.7 8.7 - 10.2 mg/dL   Total Protein 7.6 6.0 - 8.5 g/dL   Albumin 4.4 3.8 - 4.8 g/dL   Globulin, Total 3.2 1.5 - 4.5 g/dL   Albumin/Globulin Ratio 1.4 1.2 - 2.2   Bilirubin Total 0.2 0.0 - 1.2 mg/dL   Alkaline Phosphatase 90 39 - 117 IU/L   AST 14 0 - 40 IU/L   ALT 19 0 - 32 IU/L  Lipid Panel     Status: Abnormal   Collection Time: 12/31/19 10:58 AM  Result Value Ref Range   Cholesterol, Total 193 100 - 199 mg/dL   Triglycerides 66 0 - 149 mg/dL   HDL 48 >39 mg/dL   VLDL Cholesterol Cal 12 5 - 40 mg/dL   LDL Chol Calc (NIH) 133 (H) 0 - 99 mg/dL   Chol/HDL Ratio 4.0 0.0 - 4.4 ratio    Comment:                                   T. Chol/HDL Ratio  Men  Women                               1/2 Avg.Risk  3.4    3.3                                   Avg.Risk  5.0    4.4                                2X Avg.Risk  9.6    7.1                                3X Avg.Risk 23.4   11.0   RPR     Status: None   Collection Time: 12/31/19 10:58 AM  Result Value Ref Range   RPR Ser Ql Non Reactive Non Reactive  HIV Antibody (routine testing w rflx)     Status: None   Collection Time:  12/31/19 10:58 AM  Result Value Ref Range   HIV Screen 4th Generation wRfx Non Reactive Non Reactive  Hemoglobin A1c     Status: Abnormal   Collection Time: 12/31/19 10:58 AM  Result Value Ref Range   Hgb A1c MFr Bld 6.9 (H) 4.8 - 5.6 %    Comment:          Prediabetes: 5.7 - 6.4          Diabetes: >6.4          Glycemic control for adults with diabetes: <7.0    Est. average glucose Bld gHb Est-mCnc 151 mg/dL  CBC with Differential     Status: Abnormal   Collection Time: 01/13/20  4:21 PM  Result Value Ref Range   WBC 11.0 (H) 3.4 - 10.8 x10E3/uL   RBC 5.08 3.77 - 5.28 x10E6/uL   Hemoglobin 13.2 11.1 - 15.9 g/dL   Hematocrit 40.6 34.0 - 46.6 %   MCV 80 79 - 97 fL   MCH 26.0 (L) 26.6 - 33.0 pg   MCHC 32.5 31.5 - 35.7 g/dL   RDW 13.2 11.7 - 15.4 %   Platelets 523 (H) 150 - 450 x10E3/uL   Neutrophils 67 Not Estab. %   Lymphs 25 Not Estab. %   Monocytes 6 Not Estab. %   Eos 2 Not Estab. %   Basos 0 Not Estab. %   Neutrophils Absolute 7.2 (H) 1.4 - 7.0 x10E3/uL   Lymphocytes Absolute 2.8 0.7 - 3.1 x10E3/uL   Monocytes Absolute 0.7 0.1 - 0.9 x10E3/uL   EOS (ABSOLUTE) 0.2 0.0 - 0.4 x10E3/uL   Basophils Absolute 0.0 0.0 - 0.2 x10E3/uL   Immature Granulocytes 0 Not Estab. %   Immature Grans (Abs) 0.0 0.0 - 0.1 x10E3/uL  Iron, TIBC and Ferritin Panel     Status: None   Collection Time: 01/13/20  4:21 PM  Result Value Ref Range   Total Iron Binding Capacity 299 250 - 450 ug/dL   UIBC 244 131 - 425 ug/dL   Iron 55 27 - 159 ug/dL   Iron Saturation 18 15 - 55 %   Ferritin 89 15 - 150 ng/mL  Microalbumin/Creatinine Ratio, Urine     Status: None   Collection Time: 01/13/20  4:24 PM  Result Value  Ref Range   Creatinine, Urine 107.0 Not Estab. mg/dL   Microalbumin, Urine 14.5 Not Estab. ug/mL   Microalb/Creat Ratio 14 0 - 29 mg/g creat    Comment:                        Normal:                0 -  29                        Moderately increased: 30 - 300                         Severely increased:       >300   Pathologist smear review     Status: None   Collection Time: 01/13/20  5:00 PM  Result Value Ref Range   Path Review Thrombocytosis     Comment: Reactive appearing lymphocytes Mild left shift, including few nucleated RBC Reviewed by Lennox Solders. Lyndon Code, M.D. 01/15/2020 Performed at [facility], [place] [address] [place], [place] [address]    ASSESSMENT & PLAN  Thrombocytosis ([place]) Her chronic intermittent thrombocytosis appears to be likely related to reactive thrombocytosis as a response to recurrent skin infection I plan to see her back in a month for further follow-up and repeat blood work Clinically, she is not symptomatic I do not believe the pattern of her CBC is seen represent any form of hematological malignancy  Leukocytosis She has recurrent intermittent leukocytosis related to skin infection She just completed a course of antibiotics therapy 4 days ago I do not plan to recheck her blood work today as it is likely to be abnormal I plan to see her back in a month for further follow-up and we will recheck a CBC at that time and she is in agreement  Recurrent infection of skin The skin infection on her back has resolved She has multiple scarring on both axilla from history of 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 CBC with Differential/Platelet    Standing Status:   Future    Standing Expiration Date:   02/25/2021  . Sedimentation rate    Standing Status:   Future    Standing Expiration Date:   02/25/2021  . Urinalysis, Complete w Microscopic    Standing Status:   Future    Standing Expiration Date:   01/21/2021    All questions were answered. The patient knows to call the clinic with any problems, questions or concerns. I spent  40 minutes counseling the patient face to face.     [name], MD 01/22/2020 7:14 PM

## 2020-01-22 NOTE — Assessment & Plan Note (Signed)
Her chronic intermittent thrombocytosis appears to be likely related to reactive thrombocytosis as a response to recurrent skin infection I plan to see her back in a month for further follow-up and repeat blood work Clinically, she is not symptomatic I do not believe the pattern of her CBC is seen represent any form of hematological malignancy

## 2020-01-22 NOTE — Assessment & Plan Note (Signed)
She has recurrent intermittent leukocytosis related to skin infection She just completed a course of antibiotics therapy 4 days ago I do not plan to recheck her blood work today as it is likely to be abnormal I plan to see her back in a month for further follow-up and we will recheck a CBC at that time and she is in agreement

## 2020-01-27 ENCOUNTER — Telehealth: Payer: Self-pay | Admitting: Hematology and Oncology

## 2020-01-27 NOTE — Telephone Encounter (Signed)
Spoke with pt and pt is aware of appts on 5/28 per 4/30 sch msg.

## 2020-02-19 ENCOUNTER — Inpatient Hospital Stay: Payer: Self-pay | Attending: Hematology and Oncology | Admitting: Hematology and Oncology

## 2020-02-19 ENCOUNTER — Encounter: Payer: Self-pay | Admitting: Hematology and Oncology

## 2020-02-19 ENCOUNTER — Other Ambulatory Visit: Payer: Self-pay

## 2020-02-19 ENCOUNTER — Inpatient Hospital Stay: Payer: Self-pay

## 2020-02-19 DIAGNOSIS — D72829 Elevated white blood cell count, unspecified: Secondary | ICD-10-CM | POA: Insufficient documentation

## 2020-02-19 DIAGNOSIS — Z7984 Long term (current) use of oral hypoglycemic drugs: Secondary | ICD-10-CM | POA: Insufficient documentation

## 2020-02-19 DIAGNOSIS — L089 Local infection of the skin and subcutaneous tissue, unspecified: Secondary | ICD-10-CM

## 2020-02-19 DIAGNOSIS — D75839 Thrombocytosis, unspecified: Secondary | ICD-10-CM

## 2020-02-19 DIAGNOSIS — Z79899 Other long term (current) drug therapy: Secondary | ICD-10-CM | POA: Insufficient documentation

## 2020-02-19 DIAGNOSIS — D473 Essential (hemorrhagic) thrombocythemia: Secondary | ICD-10-CM | POA: Insufficient documentation

## 2020-02-19 LAB — URINALYSIS, COMPLETE (UACMP) WITH MICROSCOPIC
Bilirubin Urine: NEGATIVE
Glucose, UA: NEGATIVE mg/dL
Ketones, ur: NEGATIVE mg/dL
Leukocytes,Ua: NEGATIVE
Nitrite: NEGATIVE
Protein, ur: NEGATIVE mg/dL
Specific Gravity, Urine: 1.014 (ref 1.005–1.030)
pH: 6 (ref 5.0–8.0)

## 2020-02-19 LAB — CBC WITH DIFFERENTIAL/PLATELET
Abs Immature Granulocytes: 0.02 10*3/uL (ref 0.00–0.07)
Basophils Absolute: 0 10*3/uL (ref 0.0–0.1)
Basophils Relative: 0 %
Eosinophils Absolute: 0.1 10*3/uL (ref 0.0–0.5)
Eosinophils Relative: 1 %
HCT: 40.6 % (ref 36.0–46.0)
Hemoglobin: 13 g/dL (ref 12.0–15.0)
Immature Granulocytes: 0 %
Lymphocytes Relative: 29 %
Lymphs Abs: 2.6 10*3/uL (ref 0.7–4.0)
MCH: 26.3 pg (ref 26.0–34.0)
MCHC: 32 g/dL (ref 30.0–36.0)
MCV: 82.2 fL (ref 80.0–100.0)
Monocytes Absolute: 0.6 10*3/uL (ref 0.1–1.0)
Monocytes Relative: 6 %
Neutro Abs: 5.9 10*3/uL (ref 1.7–7.7)
Neutrophils Relative %: 64 %
Platelets: 524 10*3/uL — ABNORMAL HIGH (ref 150–400)
RBC: 4.94 MIL/uL (ref 3.87–5.11)
RDW: 13.8 % (ref 11.5–15.5)
WBC: 9.2 10*3/uL (ref 4.0–10.5)
nRBC: 0 % (ref 0.0–0.2)

## 2020-02-19 LAB — SEDIMENTATION RATE: Sed Rate: 33 mm/hr — ABNORMAL HIGH (ref 0–22)

## 2020-02-19 NOTE — Progress Notes (Signed)
[facility] OFFICE PROGRESS NOTE  Amy Bang, DO  ASSESSMENT & PLAN:  Leukocytosis This has resolved Observe only for now  Thrombocytosis ([place]) Her platelet count is intermittently elevated She is not symptomatic This is likely reactive in nature Rare myeloproliferative disorder such as essential thrombocytosis cannot be excluded, but unlikely given her young age For now, I recommend low-dose 81 mg aspirin therapy to reduce risk of thrombosis I plan to see her again in 6 months for further follow-up   No orders of the defined types were placed in this encounter.   The total time spent in the appointment was 15 minutes encounter with patients including review of chart and various tests results, discussions about plan of care and coordination of care plan   All questions were answered. The patient knows to call the clinic with any problems, questions or concerns. No barriers to learning was detected.    [name], MD 5/28/20215:12 PM  INTERVAL HISTORY: Amy Kim 34 y.o. female returns for further follow-up on abnormal CBC She is doing well No recent bleeding  No recent infection, fever or chills   SUMMARY OF HEMATOLOGIC HISTORY:  She was found to have abnormal CBC from blood work that was being drawn at her primary care doctor's office I have the opportunity to review his CBC dated back from August 2019. Her white blood cell count ranged from normal at 8.6 to as high as 12.3 Her platelet count ranges from 304-545 She has frequent, recurrent skin infection affecting both axillary region on a regular basis, typically related to changes in her menstrual cycle She just completed a course of antibiotics approximately 4 days ago for a very significant skin infection on her back Currently, she does not have symptoms of sinus congestion, cough, urinary frequency/urgency or dysuria, diarrhea, joint swelling/pain or abnormal skin rash.  She  had no prior history or diagnosis of cancer. Her age appropriate screening programs are up-to-date. She does not smoke She denies prior history of blood clots She is being observed  I have reviewed the past medical history, past surgical history, social history and family history with the patient and they are unchanged from previous note.  ALLERGIES:  has No Known Allergies.  MEDICATIONS:  Current Outpatient Medications  Medication Sig Dispense Refill  . amLODipine (NORVASC) 5 MG tablet Take 1 tablet (5 mg total) by mouth daily. 30 tablet 3  . medroxyPROGESTERone (DEPO-PROVERA) 150 MG/ML injection Inject 1 mL (150 mg total) into the muscle every 3 (three) months. 1 mL 0  . metFORMIN (GLUCOPHAGE) 500 MG tablet Take 1 tablet (500 mg total) by mouth daily with breakfast. 90 tablet 1   Current Facility-Administered Medications  Medication Dose Route Frequency Provider Last Rate Last Admin  . medroxyPROGESTERone (DEPO-PROVERA) injection 150 mg  150 mg Intramuscular Q90 days [name], MD   150 mg at 12/28/19 1614     REVIEW OF SYSTEMS:   Constitutional: Denies fevers, chills or night sweats Eyes: Denies blurriness of vision Ears, nose, mouth, throat, and face: Denies mucositis or sore throat Respiratory: Denies cough, dyspnea or wheezes Cardiovascular: Denies palpitation, chest discomfort or lower extremity swelling Gastrointestinal:  Denies nausea, heartburn or change in bowel habits Skin: Denies abnormal skin rashes Lymphatics: Denies new lymphadenopathy or easy bruising Neurological:Denies numbness, tingling or new weaknesses Behavioral/Psych: Mood is stable, no new changes  All other systems were reviewed with the patient and are negative.  PHYSICAL EXAMINATION: ECOG PERFORMANCE STATUS: 0 - Asymptomatic  Vitals:   02/19/20 1054  BP: (!) 143/84  Pulse: 75  Resp: 18  Temp: 98.1 F (36.7 C)  SpO2: 100%   Filed Weights   02/19/20 1054  Weight: 205 lb 3.2 oz (93.1  kg)    GENERAL:alert, no distress and comfortable NEURO: alert & oriented x 3 with fluent speech, no focal motor/sensory deficits  LABORATORY DATA:  I have reviewed the data as listed     Component Value Date/Time   NA 140 12/31/2019 1058   K 4.3 12/31/2019 1058   CL 106 12/31/2019 1058   CO2 19 (L) 12/31/2019 1058   GLUCOSE 112 (H) 12/31/2019 1058   GLUCOSE 83 03/15/2016 1253   GLUCOSE 82 03/30/2015 1120   BUN 13 12/31/2019 1058   CREATININE 0.69 12/31/2019 1058   CALCIUM 9.7 12/31/2019 1058   PROT 7.6 12/31/2019 1058   ALBUMIN 4.4 12/31/2019 1058   AST 14 12/31/2019 1058   ALT 19 12/31/2019 1058   ALKPHOS 90 12/31/2019 1058   BILITOT 0.2 12/31/2019 1058   GFRNONAA 115 12/31/2019 1058   GFRAA 132 12/31/2019 1058    No results found for: SPEP, UPEP  Lab Results  Component Value Date   WBC 9.2 02/19/2020   NEUTROABS 5.9 02/19/2020   HGB 13.0 02/19/2020   HCT 40.6 02/19/2020   MCV 82.2 02/19/2020   PLT 524 (H) 02/19/2020      Chemistry      Component Value Date/Time   NA 140 12/31/2019 1058   K 4.3 12/31/2019 1058   CL 106 12/31/2019 1058   CO2 19 (L) 12/31/2019 1058   BUN 13 12/31/2019 1058   CREATININE 0.69 12/31/2019 1058      Component Value Date/Time   CALCIUM 9.7 12/31/2019 1058   ALKPHOS 90 12/31/2019 1058   AST 14 12/31/2019 1058   ALT 19 12/31/2019 1058   BILITOT 0.2 12/31/2019 1058

## 2020-02-19 NOTE — Assessment & Plan Note (Signed)
Her platelet count is intermittently elevated She is not symptomatic This is likely reactive in nature Rare myeloproliferative disorder such as essential thrombocytosis cannot be excluded, but unlikely given her young age For now, I recommend low-dose 81 mg aspirin therapy to reduce risk of thrombosis I plan to see her again in 6 months for further follow-up

## 2020-02-19 NOTE — Assessment & Plan Note (Signed)
This has resolved Observe only for now 

## 2020-02-23 ENCOUNTER — Telehealth: Payer: Self-pay | Admitting: Hematology and Oncology

## 2020-02-23 NOTE — Telephone Encounter (Signed)
Scheduled appts per 5/28 sch msg. Left voicemail with appt date and time.

## 2020-03-03 ENCOUNTER — Ambulatory Visit: Payer: Medicaid Other

## 2020-03-10 ENCOUNTER — Ambulatory Visit: Payer: Medicaid Other

## 2020-03-16 ENCOUNTER — Other Ambulatory Visit: Payer: Self-pay | Admitting: Obstetrics

## 2020-03-16 DIAGNOSIS — Z3042 Encounter for surveillance of injectable contraceptive: Secondary | ICD-10-CM

## 2020-03-17 ENCOUNTER — Ambulatory Visit: Payer: Medicaid Other

## 2020-03-23 ENCOUNTER — Other Ambulatory Visit: Payer: Self-pay

## 2020-03-23 ENCOUNTER — Ambulatory Visit (INDEPENDENT_AMBULATORY_CARE_PROVIDER_SITE_OTHER): Payer: Medicaid Other

## 2020-03-23 DIAGNOSIS — Z3042 Encounter for surveillance of injectable contraceptive: Secondary | ICD-10-CM

## 2020-03-23 NOTE — Progress Notes (Signed)
Nurse visit for pt supply Depo Pt is on time for injection Depo given LD w/o difficulty  Next Depo due Sept 15-30

## 2020-04-13 ENCOUNTER — Ambulatory Visit: Payer: Medicaid Other | Admitting: Internal Medicine

## 2020-04-13 NOTE — Progress Notes (Signed)
Patient ID: Amy Kim, female   DOB: [birth year], 34 y.o.   MRN: [id number] Patient was assessed and managed by nursing staff during this encounter. I have reviewed the chart and agree with the documentation and plan. I have also made any necessary editorial changes.  Emeterio Reeve, MD 04/13/2020 1:23 PM

## 2020-04-29 DIAGNOSIS — I10 Essential (primary) hypertension: Secondary | ICD-10-CM

## 2020-04-29 MED ORDER — AMLODIPINE BESYLATE 5 MG PO TABS: 5.0000 mg | ORAL_TABLET | Freq: Every day | ORAL | 3 refills | Status: DC

## 2020-05-11 ENCOUNTER — Other Ambulatory Visit: Payer: Self-pay

## 2020-05-11 ENCOUNTER — Ambulatory Visit (INDEPENDENT_AMBULATORY_CARE_PROVIDER_SITE_OTHER): Payer: 59 | Admitting: Internal Medicine

## 2020-05-11 ENCOUNTER — Encounter: Payer: Self-pay | Admitting: Internal Medicine

## 2020-05-11 VITALS — BP 127/84 | HR 93 | Temp 97.5°F | Resp 17 | Wt 203.0 lb

## 2020-05-11 DIAGNOSIS — L02212 Cutaneous abscess of back [any part, except buttock]: Secondary | ICD-10-CM | POA: Diagnosis not present

## 2020-05-11 NOTE — Progress Notes (Signed)
  Subjective:    Mahlet Jergens - 34 y.o. female MRN [id number]  Date of birth: 16-Oct-1985  HPI  Tamelia Stenzel is here for concern about a cyst on her back. Reports that it returned to left upper back about 1.5 weeks ago. Was large and inflamed and draining pus. Now just seems to be bleeding. Wants this to be checked.      Health Maintenance:  Health Maintenance Due  Topic Date Due  . Hepatitis C Screening  Never done  . PNEUMOCOCCAL POLYSACCHARIDE VACCINE AGE 52-64 HIGH RISK  Never done  . FOOT EXAM  Never done  . OPHTHALMOLOGY EXAM  Never done  . COVID-19 Vaccine (1) Never done  . INFLUENZA VACCINE  04/24/2020    -  reports that she has never smoked. She has never used smokeless tobacco. - Review of Systems: Per HPI. - Past Medical History: Patient Active Problem List   Diagnosis Date Noted  . Recurrent infection of skin 01/22/2020  . Leukocytosis 01/22/2020  . New onset type 2 diabetes mellitus ([place]) 01/13/2020  . Thrombocytosis ([place]) 01/13/2020  . Essential hypertension 12/17/2019  . Refusal of blood transfusions as patient is Jehovah's Witness 09/18/2018  . Abdominal cyst 03/29/2015  . History of C-section 03/24/2015   - Medications: reviewed and updated   Objective:   Physical Exam There were no vitals taken for this visit. Physical Exam Constitutional:      General: She is not in acute distress.    Appearance: She is not diaphoretic.  Cardiovascular:     Rate and Rhythm: Normal rate.  Pulmonary:     Effort: Pulmonary effort is normal. No respiratory distress.  Musculoskeletal:        General: Normal range of motion.  Skin:    General: Skin is warm and dry.     Comments: Small area about size of pencil tip that is open on left upper back with minimal old blood noted. No bleeding or pus expressed with pressure. No fluctuance or induration. No surrounding erythema.   Neurological:     Mental Status: She is alert and oriented to person, place, and time.   Psychiatric:        Mood and Affect: Affect normal.        Judgment: Judgment normal.            Assessment & Plan:   1. Abscess of back Essentially resolved. Suspect that self drained. No evidence of underlying abscess that would warrant I&D. No signs of secondary skin infection warranting antibiotics. Discussed care of skin going forward and reasons to contact again/signs of infection.      Phill Myron, D.O. 05/11/2020, 2:56 PM Primary Care at [facility]

## 2020-06-16 ENCOUNTER — Other Ambulatory Visit: Payer: Self-pay

## 2020-06-16 ENCOUNTER — Other Ambulatory Visit: Payer: Self-pay | Admitting: Obstetrics

## 2020-06-16 ENCOUNTER — Ambulatory Visit (INDEPENDENT_AMBULATORY_CARE_PROVIDER_SITE_OTHER): Payer: 59

## 2020-06-16 DIAGNOSIS — Z3042 Encounter for surveillance of injectable contraceptive: Secondary | ICD-10-CM

## 2020-06-16 NOTE — Progress Notes (Signed)
Patient was assessed and managed by nursing staff during this encounter. I have reviewed the chart and agree with the documentation and plan. I have also made any necessary editorial changes.  Mora Bellman, MD 06/16/2020 4:15 PM

## 2020-06-16 NOTE — Progress Notes (Signed)
Pt is in the office for depo injection, administered in RD and pt tolerated well. Next due Dec 9-23 .Marland Kitchen Administrations This Visit    medroxyPROGESTERone (DEPO-PROVERA) injection 150 mg    Admin Date 06/16/2020 Action Given Dose 150 mg Route Intramuscular Administered By Hinton Lovely, RN

## 2020-07-07 ENCOUNTER — Telehealth (INDEPENDENT_AMBULATORY_CARE_PROVIDER_SITE_OTHER): Payer: 59 | Admitting: Internal Medicine

## 2020-07-07 ENCOUNTER — Encounter: Payer: Self-pay | Admitting: Internal Medicine

## 2020-07-07 DIAGNOSIS — I10 Essential (primary) hypertension: Secondary | ICD-10-CM | POA: Diagnosis not present

## 2020-07-07 DIAGNOSIS — Z1159 Encounter for screening for other viral diseases: Secondary | ICD-10-CM | POA: Diagnosis not present

## 2020-07-07 DIAGNOSIS — E119 Type 2 diabetes mellitus without complications: Secondary | ICD-10-CM | POA: Diagnosis not present

## 2020-07-07 DIAGNOSIS — L02212 Cutaneous abscess of back [any part, except buttock]: Secondary | ICD-10-CM | POA: Diagnosis not present

## 2020-07-07 NOTE — Progress Notes (Signed)
Virtual Visit via Telephone Note  I connected with Amy Kim, on 07/07/2020 at 11:13 AM by telephone due to the COVID-19 pandemic and verified that I am speaking with the correct person using two identifiers.   Consent: I discussed the limitations, risks, security and privacy concerns of performing an evaluation and management service by telephone and the availability of in person appointments. I also discussed with the patient that there may be a patient responsible charge related to this service. The patient expressed understanding and agreed to proceed.   Location of Patient: Home   Location of Provider: Clinic    Persons participating in Telemedicine visit: Amy Kim [name] [name]      History of Present Illness: Patient has a visit to follow up on chronic medical conditions.   Diabetes mellitus, Type 2 Disease Monitoring             Blood Sugar Ranges: Not monitoring              Polyuria: no              Visual problems: no   Urine Microalbumin 14 (April 2021)   Last A1C: 6.9 (April 2021)   Medications: Metformin 500 mg daily  Medication Compliance: yes  Medication Side Effects             Hypoglycemia: no    Chronic HTN Disease Monitoring:  Home BP Monitoring - Does not have a cuff  Chest pain- no  Dyspnea- no Headache - no  Medications: Amlodipine 5 mg  Compliance- yes Lightheadedness- no  Edema- no     Past Medical History:  Diagnosis Date  . Back pain    DDD  . History of migraine    last one 2 days ago  . Pregnancy induced hypertension   . Urinary frequency    No Known Allergies  Current Outpatient Medications on File Prior to Visit  Medication Sig Dispense Refill  . amLODipine (NORVASC) 5 MG tablet Take 1 tablet (5 mg total) by mouth daily. 30 tablet 3  . medroxyPROGESTERone (DEPO-PROVERA) 150 MG/ML injection Inject 1 mL (150 mg total) into the muscle every 3 (three) months. 1 mL 0  .  metFORMIN (GLUCOPHAGE) 500 MG tablet Take 1 tablet (500 mg total) by mouth daily with breakfast. 90 tablet 1   Current Facility-Administered Medications on File Prior to Visit  Medication Dose Route Frequency Provider Last Rate Last Admin  . medroxyPROGESTERone (DEPO-PROVERA) injection 150 mg  150 mg Intramuscular Q90 days [name], MD   150 mg at 06/16/20 1602    Observations/Objective: NAD. Speaking clearly.  Work of breathing normal.  Alert and oriented. Mood appropriate.   Assessment and Plan: 1. New onset type 2 diabetes mellitus (HCC) Last A1c showed overall good glucose control with result of 6.9 and <goal of 7. Continue Metformin. Repeat A1c to monitor as has been 6 months.  - Hemoglobin A1c; Future - Ambulatory referral to Ophthalmology  2. Essential hypertension Unable to assess if BP at goal. Asymptomatic. Continue Amlodipine.   3. Need for hepatitis C screening test - HCV Ab w/Rflx to Verification; Future  4. Abscess of back Recurrent in the same location. No other abscesses/boils so lower suspicion for hidradenitis. Although patient has DM, which could inhibit wound healing her glucose has been well controlled at prior visit. Will repeat A1c. In meantime, refer to dermatology for evaluation.   - Ambulatory referral to Dermatology   Follow Up Instructions: Lab  visit 10/20    I discussed the assessment and treatment plan with the patient. The patient was provided an opportunity to ask questions and all were answered. The patient agreed with the plan and demonstrated an understanding of the instructions.   The patient was advised to call back or seek an in-person evaluation if the symptoms worsen or if the condition fails to improve as anticipated.     I provided 20 minutes total of non-face-to-face time during this encounter including median intraservice time, reviewing previous notes, investigations, ordering medications, medical decision making,  coordinating care and patient verbalized understanding at the end of the visit.    Phill Myron, D.O. Primary Care at [facility]  07/07/2020, 11:13 AM

## 2020-07-13 ENCOUNTER — Other Ambulatory Visit: Payer: 59

## 2020-07-27 ENCOUNTER — Other Ambulatory Visit: Payer: Self-pay

## 2020-07-27 ENCOUNTER — Other Ambulatory Visit (INDEPENDENT_AMBULATORY_CARE_PROVIDER_SITE_OTHER): Payer: 59

## 2020-07-27 DIAGNOSIS — Z1159 Encounter for screening for other viral diseases: Secondary | ICD-10-CM

## 2020-07-27 DIAGNOSIS — E119 Type 2 diabetes mellitus without complications: Secondary | ICD-10-CM

## 2020-07-28 ENCOUNTER — Other Ambulatory Visit: Payer: Self-pay | Admitting: Internal Medicine

## 2020-07-28 DIAGNOSIS — E119 Type 2 diabetes mellitus without complications: Secondary | ICD-10-CM

## 2020-07-28 LAB — HCV AB W/RFLX TO VERIFICATION: HCV Ab: 0.1 s/co ratio (ref 0.0–0.9)

## 2020-07-28 LAB — HEMOGLOBIN A1C
Est. average glucose Bld gHb Est-mCnc: 166 mg/dL
Hgb A1c MFr Bld: 7.4 % — ABNORMAL HIGH (ref 4.8–5.6)

## 2020-07-28 LAB — HCV INTERPRETATION

## 2020-07-28 MED ORDER — METFORMIN HCL 500 MG PO TABS: 500.0000 mg | ORAL_TABLET | Freq: Two times a day (BID) | ORAL | 1 refills | Status: DC

## 2020-08-25 ENCOUNTER — Other Ambulatory Visit: Payer: Self-pay

## 2020-08-25 DIAGNOSIS — D72829 Elevated white blood cell count, unspecified: Secondary | ICD-10-CM

## 2020-08-26 ENCOUNTER — Inpatient Hospital Stay: Payer: 59

## 2020-08-26 ENCOUNTER — Other Ambulatory Visit: Payer: Self-pay | Admitting: Hematology and Oncology

## 2020-08-26 ENCOUNTER — Inpatient Hospital Stay: Payer: 59 | Attending: Hematology and Oncology | Admitting: Hematology and Oncology

## 2020-08-26 ENCOUNTER — Other Ambulatory Visit: Payer: Self-pay

## 2020-08-26 ENCOUNTER — Encounter: Payer: Self-pay | Admitting: Hematology and Oncology

## 2020-08-26 DIAGNOSIS — D75839 Thrombocytosis, unspecified: Secondary | ICD-10-CM

## 2020-08-26 DIAGNOSIS — E119 Type 2 diabetes mellitus without complications: Secondary | ICD-10-CM | POA: Diagnosis not present

## 2020-08-26 DIAGNOSIS — Z7984 Long term (current) use of oral hypoglycemic drugs: Secondary | ICD-10-CM | POA: Diagnosis not present

## 2020-08-26 DIAGNOSIS — Z79899 Other long term (current) drug therapy: Secondary | ICD-10-CM | POA: Insufficient documentation

## 2020-08-26 DIAGNOSIS — E669 Obesity, unspecified: Secondary | ICD-10-CM | POA: Insufficient documentation

## 2020-08-26 DIAGNOSIS — D72829 Elevated white blood cell count, unspecified: Secondary | ICD-10-CM | POA: Diagnosis present

## 2020-08-26 DIAGNOSIS — L089 Local infection of the skin and subcutaneous tissue, unspecified: Secondary | ICD-10-CM | POA: Insufficient documentation

## 2020-08-26 LAB — CBC WITH DIFFERENTIAL (CANCER CENTER ONLY)
Abs Immature Granulocytes: 0.03 10*3/uL (ref 0.00–0.07)
Basophils Absolute: 0 10*3/uL (ref 0.0–0.1)
Basophils Relative: 0 %
Eosinophils Absolute: 0.1 10*3/uL (ref 0.0–0.5)
Eosinophils Relative: 1 %
HCT: 40 % (ref 36.0–46.0)
Hemoglobin: 12.9 g/dL (ref 12.0–15.0)
Immature Granulocytes: 0 %
Lymphocytes Relative: 21 %
Lymphs Abs: 2.3 10*3/uL (ref 0.7–4.0)
MCH: 26 pg (ref 26.0–34.0)
MCHC: 32.3 g/dL (ref 30.0–36.0)
MCV: 80.6 fL (ref 80.0–100.0)
Monocytes Absolute: 0.6 10*3/uL (ref 0.1–1.0)
Monocytes Relative: 5 %
Neutro Abs: 7.9 10*3/uL — ABNORMAL HIGH (ref 1.7–7.7)
Neutrophils Relative %: 73 %
Platelet Count: 500 10*3/uL — ABNORMAL HIGH (ref 150–400)
RBC: 4.96 MIL/uL (ref 3.87–5.11)
RDW: 13.7 % (ref 11.5–15.5)
WBC Count: 11 10*3/uL — ABNORMAL HIGH (ref 4.0–10.5)
nRBC: 0 % (ref 0.0–0.2)

## 2020-08-26 LAB — FERRITIN: Ferritin: 65 ng/mL (ref 11–307)

## 2020-08-26 LAB — SEDIMENTATION RATE: Sed Rate: 31 mm/hr — ABNORMAL HIGH (ref 0–22)

## 2020-08-26 LAB — IRON AND TIBC
Iron: 59 ug/dL (ref 41–142)
Saturation Ratios: 20 % — ABNORMAL LOW (ref 21–57)
TIBC: 296 ug/dL (ref 236–444)
UIBC: 237 ug/dL (ref 120–384)

## 2020-08-26 NOTE — Assessment & Plan Note (Signed)
She continues to have intermittent leukocytosis, predominantly neutrophilia She has active skin infection at the crack of her but which I suspect contribute to this She is reassured that this is a benign reactive process

## 2020-08-26 NOTE — Assessment & Plan Note (Signed)
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

## 2020-08-26 NOTE — Assessment & Plan Note (Addendum)
She has class II obesity and was recently found to be prediabetic and is on Metformin We discussed the importance of weight loss along with dietary modification This could help with reducing fluctuation of her hormonal changes and then in effect can affect her blood count

## 2020-08-26 NOTE — Assessment & Plan Note (Signed)
She has intermittent thrombocytosis that is likely reactive in nature but much improved Again, I suspect this is reactive in nature I will check iron studies just to be sure

## 2020-08-26 NOTE — Progress Notes (Signed)
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   The total time spent in the appointment was 20 minutes encounter with patients including review of chart and various tests results, discussions about plan of care and coordination of care plan   All questions were answered. The patient knows to call the clinic with any problems, questions or concerns. No barriers to learning was detected.    [name], MD 12/3/202111:53 AM  INTERVAL HISTORY: Amy Kim 34 y.o. female returns for further follow-up on reactive thrombocytosis and leukocytosis Since last time I saw her, she was placed on Metformin due to diabetes She has lost some weight since last time I saw her She continues to have recurrent skin infection that coincide with her hormonal changes She has an active infection right now on the skin at the buttock area No recent fever or  chills  SUMMARY OF HEMATOLOGIC HISTORY:  She was found to have abnormal CBC from blood work that was being drawn at her primary care doctor's office I have the opportunity to review his CBC dated back from August 2019. Her white blood cell count ranged from normal at 8.6 to as high as 12.3 Her platelet count ranges from 304-545 She has frequent, recurrent skin infection affecting both axillary region on a regular basis, typically related to changes in her menstrual cycle She just completed a course of antibiotics approximately 4 days ago for a very significant skin infection on her back Currently, she does not have symptoms of sinus congestion, cough, urinary frequency/urgency or dysuria, diarrhea, joint swelling/pain or abnormal skin rash.  She had no prior history or diagnosis of cancer. Her age appropriate screening programs are up-to-date. She does not smoke She denies prior history of blood clots She is being observed The cause of her thrombocytosis and leukocytosis are not deemed reactive in nature  I have reviewed the past medical history, past surgical history, social history and family history with the patient and they are unchanged from previous note.  ALLERGIES:  has No Known Allergies.  MEDICATIONS:  Current Outpatient Medications  Medication Sig Dispense Refill  . amLODipine (NORVASC) 5 MG tablet Take 1 tablet (5 mg total) by mouth daily. 30 tablet 3  . medroxyPROGESTERone (DEPO-PROVERA) 150 MG/ML injection Inject 1 mL (150 mg total) into the muscle every 3 (three) months. 1 mL 0  . metFORMIN (GLUCOPHAGE) 500 MG tablet Take 1 tablet (500 mg total) by mouth 2 (two) times daily with a meal. 180  tablet 1   Current Facility-Administered Medications  Medication Dose Route Frequency Provider Last Rate Last Admin  . medroxyPROGESTERone (DEPO-PROVERA) injection 150 mg  150 mg Intramuscular Q90 days [name], MD   150 mg at 06/16/20 1602     REVIEW OF SYSTEMS:    Constitutional: Denies fevers, chills or night sweats Eyes: Denies blurriness of vision Ears, nose, mouth, throat, and face: Denies mucositis or sore throat Respiratory: Denies cough, dyspnea or wheezes Cardiovascular: Denies palpitation, chest discomfort or lower extremity swelling Gastrointestinal:  Denies nausea, heartburn or change in bowel habits Skin: Denies abnormal skin rashes Lymphatics: Denies new lymphadenopathy or easy bruising Neurological:Denies numbness, tingling or new weaknesses Behavioral/Psych: Mood is stable, no new changes  All other systems were reviewed with the patient and are negative.  PHYSICAL EXAMINATION: ECOG PERFORMANCE STATUS: 0 - Asymptomatic  Vitals:   08/26/20 1126  BP: 133/89  Pulse: 81  Resp: 18  Temp: 98 F (36.7 C)   Filed Weights   08/26/20 1126  Weight: 201 lb 12.8 oz (91.5 kg)    GENERAL:alert, no distress and comfortable SKIN: skin color, texture, turgor are normal, no rashes or significant lesions.  The recent boil at the back has resolved.  I did not examine the boil on her buttock region NEURO: alert & oriented x 3 with fluent speech, no focal motor/sensory deficits  LABORATORY DATA:  I have reviewed the data as listed     Component Value Date/Time   NA 140 12/31/2019 1058   K 4.3 12/31/2019 1058   CL 106 12/31/2019 1058   CO2 19 (L) 12/31/2019 1058   GLUCOSE 112 (H) 12/31/2019 1058   GLUCOSE 83 03/15/2016 1253   GLUCOSE 82 03/30/2015 1120   BUN 13 12/31/2019 1058   CREATININE 0.69 12/31/2019 1058   CALCIUM 9.7 12/31/2019 1058   PROT 7.6 12/31/2019 1058   ALBUMIN 4.4 12/31/2019 1058   AST 14 12/31/2019 1058   ALT 19 12/31/2019 1058   ALKPHOS 90 12/31/2019 1058   BILITOT 0.2 12/31/2019 1058   GFRNONAA 115 12/31/2019 1058   GFRAA 132 12/31/2019 1058    No results found for: SPEP, UPEP  Lab Results  Component Value Date   WBC 11.0 (H) 08/26/2020   NEUTROABS 7.9 (H) 08/26/2020   HGB 12.9 08/26/2020   HCT 40.0  08/26/2020   MCV 80.6 08/26/2020   PLT 500 (H) 08/26/2020      Chemistry      Component Value Date/Time   NA 140 12/31/2019 1058   K 4.3 12/31/2019 1058   CL 106 12/31/2019 1058   CO2 19 (L) 12/31/2019 1058   BUN 13 12/31/2019 1058   CREATININE 0.69 12/31/2019 1058      Component Value Date/Time   CALCIUM 9.7 12/31/2019 1058   ALKPHOS 90 12/31/2019 1058   AST 14 12/31/2019 1058   ALT 19 12/31/2019 1058   BILITOT 0.2 12/31/2019 1058

## 2020-09-07 ENCOUNTER — Ambulatory Visit: Payer: 59

## 2020-09-08 ENCOUNTER — Other Ambulatory Visit: Payer: Self-pay

## 2020-09-08 DIAGNOSIS — I10 Essential (primary) hypertension: Secondary | ICD-10-CM

## 2020-09-08 MED ORDER — AMLODIPINE BESYLATE 5 MG PO TABS: 5.0000 mg | ORAL_TABLET | Freq: Every day | ORAL | 1 refills | Status: DC

## 2020-09-10 ENCOUNTER — Other Ambulatory Visit: Payer: Self-pay | Admitting: Obstetrics

## 2020-09-12 ENCOUNTER — Other Ambulatory Visit: Payer: Self-pay

## 2020-09-12 ENCOUNTER — Ambulatory Visit: Payer: 59

## 2020-09-12 ENCOUNTER — Other Ambulatory Visit: Payer: Self-pay | Admitting: *Deleted

## 2020-09-12 ENCOUNTER — Other Ambulatory Visit: Payer: Self-pay | Admitting: Obstetrics

## 2020-09-12 ENCOUNTER — Ambulatory Visit (INDEPENDENT_AMBULATORY_CARE_PROVIDER_SITE_OTHER): Payer: 59

## 2020-09-12 DIAGNOSIS — Z3042 Encounter for surveillance of injectable contraceptive: Secondary | ICD-10-CM

## 2020-09-12 MED ORDER — MEDROXYPROGESTERONE ACETATE 150 MG/ML IM SUSY
PREFILLED_SYRINGE | INTRAMUSCULAR | 3 refills | Status: DC
Start: 2020-09-12 — End: 2021-06-02

## 2020-09-12 MED ORDER — MEDROXYPROGESTERONE ACETATE 150 MG/ML IM SUSP
150.0000 mg | Freq: Once | INTRAMUSCULAR | Status: AC
  Administered 2020-09-12: 11:00:00 150 mg via INTRAMUSCULAR

## 2020-09-12 NOTE — Progress Notes (Signed)
Patient presents for Depo Injection. Patient is within her window. Injection given in Left deltoid. Patient tolerated well. Next depo 3/1-3/21.

## 2020-09-12 NOTE — Progress Notes (Signed)
Patient was assessed and managed by nursing staff during this encounter. I have reviewed the chart and agree with the documentation and plan. I have also made any necessary editorial changes.  Mora Bellman, MD 09/12/2020 4:01 PM

## 2020-09-20 LAB — HM DIABETES EYE EXAM

## 2020-11-29 ENCOUNTER — Ambulatory Visit (INDEPENDENT_AMBULATORY_CARE_PROVIDER_SITE_OTHER): Payer: 59

## 2020-11-29 ENCOUNTER — Ambulatory Visit: Payer: 59

## 2020-11-29 ENCOUNTER — Other Ambulatory Visit: Payer: Self-pay

## 2020-11-29 VITALS — BP 134/89 | HR 81 | Wt 203.0 lb

## 2020-11-29 DIAGNOSIS — Z3042 Encounter for surveillance of injectable contraceptive: Secondary | ICD-10-CM

## 2020-11-29 MED ORDER — MEDROXYPROGESTERONE ACETATE 150 MG/ML IM SUSP
150.0000 mg | Freq: Once | INTRAMUSCULAR | Status: AC
  Administered 2020-11-29: 150 mg via INTRAMUSCULAR

## 2020-11-29 NOTE — Progress Notes (Addendum)
GYN presents for DEPO Injection, given in RD, tolerated well.  Last PAP 12/31/2019  Next DEPO due May 24 to February 28, 2021  Administrations This Visit    medroxyPROGESTERone (DEPO-PROVERA) injection 150 mg    Admin Date 11/29/2020 Action Given Dose 150 mg Route Intramuscular Administered By Tamela Oddi, RMA          Patient was assessed and managed by nursing staff during this encounter. I have reviewed the chart and agree with the documentation and plan. I have also made any necessary editorial changes.  Baltazar Najjar, MD 11/29/2020 1:19 PM

## 2021-02-08 ENCOUNTER — Ambulatory Visit (INDEPENDENT_AMBULATORY_CARE_PROVIDER_SITE_OTHER): Payer: 59 | Admitting: Internal Medicine

## 2021-02-08 ENCOUNTER — Other Ambulatory Visit: Payer: Self-pay

## 2021-02-08 ENCOUNTER — Encounter: Payer: Self-pay | Admitting: Internal Medicine

## 2021-02-08 VITALS — BP 123/85 | HR 78 | Temp 97.5°F | Resp 16 | Wt 195.0 lb

## 2021-02-08 DIAGNOSIS — E119 Type 2 diabetes mellitus without complications: Secondary | ICD-10-CM

## 2021-02-08 DIAGNOSIS — L02212 Cutaneous abscess of back [any part, except buttock]: Secondary | ICD-10-CM | POA: Diagnosis not present

## 2021-02-08 DIAGNOSIS — I10 Essential (primary) hypertension: Secondary | ICD-10-CM

## 2021-02-08 NOTE — Progress Notes (Signed)
F/u DM reoccurring cyst on back

## 2021-02-08 NOTE — Progress Notes (Signed)
Subjective:    Amy Kim - 35 y.o. female MRN [id number]  Date of birth: October 08, 1985  HPI  Amy Kim is here for follow up.  Diabetes mellitus, Type 2 Disease Monitoring             Blood Sugar Ranges: Not monitoring regularly--reports last week was 180.             Polyuria: no             Visual problems: no   Urine Microalbumin 14 (April 2021)   Last A1C: 7.4 (Nov 2021)   Medications: Metformin 500 mg BID  Medication Compliance: no, only taking once per day, never tried to increase to BID   Medication Side Effects             Hypoglycemia: no      Health Maintenance:  Health Maintenance Due  Topic Date Due  . PNEUMOCOCCAL POLYSACCHARIDE VACCINE AGE 11-64 HIGH RISK  Never done  . FOOT EXAM  Never done  . URINE MICROALBUMIN  01/12/2021  . HEMOGLOBIN A1C  01/24/2021    -  reports that she has never smoked. She has never used smokeless tobacco. - Review of Systems: Per HPI. - Past Medical History: Patient Active Problem List   Diagnosis Date Noted  . Class II obesity 08/26/2020  . Recurrent infection of skin 01/22/2020  . Leukocytosis 01/22/2020  . New onset type 2 diabetes mellitus ([place]) 01/13/2020  . Thrombocytosis 01/13/2020  . Essential hypertension 12/17/2019  . Refusal of blood transfusions as patient is Jehovah's Witness 09/18/2018  . Abdominal cyst 03/29/2015  . History of C-section 03/24/2015   - Medications: reviewed and updated   Objective:   Physical Exam BP 123/85   Pulse 78   Temp (!) 97.5 F (36.4 C)   Resp 16   Wt 195 lb (88.5 kg)   SpO2 96%   BMI 36.84 kg/m  Physical Exam Constitutional:      General: She is not in acute distress.    Appearance: She is not diaphoretic.  Cardiovascular:     Rate and Rhythm: Normal rate.  Pulmonary:     Effort: Pulmonary effort is normal. No respiratory distress.  Musculoskeletal:        General: Normal range of motion.  Skin:    General: Skin is warm and dry.     Comments: Left upper  back with flat skin lesion that appears like recently healed boil. No drainage, induration, or fluctuance.   Neurological:     Mental Status: She is alert and oriented to person, place, and time.  Psychiatric:        Mood and Affect: Affect normal.        Judgment: Judgment normal.            Assessment & Plan:   1. Type 2 diabetes mellitus without complication, without long-term current use of insulin (HCC) Unable to perform POCT A1c due to equipment malfunction. Send out A1c and monitor microalbumin. Discussed with patient that with last result had recommended increase in Metformin. Suspect this will likely still be case but will monitor to determine.  - HgB A1c - Microalbumin / creatinine urine ratio  2. Essential hypertension BP well controlled. Asymptomatic. Continue current regimen.   3. Abscess of back Has been recurrent. Currently not present but has picture showing that had a draining abscess recently in same location. Will place referral to dermatology. Given only one location less concerned for HS. May be  recurrently infected cyst. Does have history of DM but has been previously well controlled.  - Ambulatory referral to Dermatology      Phill Myron, [place]. 02/08/2021, 9:54 AM Primary Care at [facility]

## 2021-02-09 LAB — MICROALBUMIN / CREATININE URINE RATIO
Creatinine, Urine: 171.7 mg/dL
Microalb/Creat Ratio: 23 mg/g creat (ref 0–29)
Microalbumin, Urine: 39.5 ug/mL

## 2021-02-10 LAB — HEMOGLOBIN A1C
Est. average glucose Bld gHb Est-mCnc: 154 mg/dL
Hgb A1c MFr Bld: 7 % — ABNORMAL HIGH (ref 4.8–5.6)

## 2021-02-10 LAB — SPECIMEN STATUS REPORT

## 2021-02-14 ENCOUNTER — Ambulatory Visit: Payer: 59

## 2021-02-24 ENCOUNTER — Ambulatory Visit: Payer: 59

## 2021-02-28 ENCOUNTER — Ambulatory Visit (INDEPENDENT_AMBULATORY_CARE_PROVIDER_SITE_OTHER): Payer: 59 | Admitting: Family

## 2021-02-28 ENCOUNTER — Other Ambulatory Visit: Payer: Self-pay

## 2021-02-28 ENCOUNTER — Encounter: Payer: Self-pay | Admitting: Family

## 2021-02-28 VITALS — BP 130/81 | HR 85 | Temp 98.1°F | Resp 18 | Ht 60.98 in | Wt 191.6 lb

## 2021-02-28 DIAGNOSIS — Z3042 Encounter for surveillance of injectable contraceptive: Secondary | ICD-10-CM | POA: Diagnosis not present

## 2021-02-28 MED ORDER — MEDROXYPROGESTERONE ACETATE 150 MG/ML IM SUSP: 150.0000 mg | Freq: Once | INTRAMUSCULAR | Status: DC

## 2021-02-28 NOTE — Progress Notes (Signed)
Pt presents for depo-injection today Administered in right deltoid

## 2021-02-28 NOTE — Progress Notes (Signed)
Patient ID: Amy Kim, female    DOB: 01-18-1986  MRN: [id number]  CC: Contraception (Depo-provera )   Subjective: Amy Kim is a 35 y.o. female who presents for Depo-Provera injection.   Her concerns today include: none.   Patient Active Problem List   Diagnosis Date Noted  . Class II obesity 08/26/2020  . Recurrent infection of skin 01/22/2020  . Leukocytosis 01/22/2020  . New onset type 2 diabetes mellitus ([place]) 01/13/2020  . Thrombocytosis 01/13/2020  . Essential hypertension 12/17/2019  . Refusal of blood transfusions as patient is Jehovah's Witness 09/18/2018  . Abdominal cyst 03/29/2015  . History of C-section 03/24/2015     Current Outpatient Medications on File Prior to Visit  Medication Sig Dispense Refill  . amLODipine (NORVASC) 5 MG tablet Take 1 tablet (5 mg total) by mouth daily. 90 tablet 1  . medroxyPROGESTERone Acetate 150 MG/ML SUSY INJECT 1 ML INTO THE MUSCLE EVERY 3 MONTHS 1 mL 3  . metFORMIN (GLUCOPHAGE) 500 MG tablet Take 1 tablet (500 mg total) by mouth 2 (two) times daily with a meal. 180 tablet 1   Current Facility-Administered Medications on File Prior to Visit  Medication Dose Route Frequency Provider Last Rate Last Admin  . medroxyPROGESTERone (DEPO-PROVERA) injection 150 mg  150 mg Intramuscular Q90 days [name], MD   150 mg at 02/28/21 1554    No Known Allergies  Social History   Socioeconomic History  . Marital status: Married    Spouse name: Not on file  . Number of children: Not on file  . Years of education: Not on file  . Highest education level: Not on file  Occupational History  . Not on file  Tobacco Use  . Smoking status: Never Smoker  . Smokeless tobacco: Never Used  Vaping Use  . Vaping Use: Never used  Substance and Sexual Activity  . Alcohol use: Not Currently    Comment: occaionally  . Drug use: No  . Sexual activity: Yes    Birth control/protection: Injection    Comment:  Medroxyprogesterone   Other Topics Concern  . Not on file  Social History Narrative  . Not on file   Social Determinants of Health   Financial Resource Strain: Not on file  Food Insecurity: Not on file  Transportation Needs: Not on file  Physical Activity: Not on file  Stress: Not on file  Social Connections: Not on file  Intimate Partner Violence: Not on file    Family History  Problem Relation Age of Onset  . Hypertension Mother   . Hypertension Father   . Heart disease Paternal Grandmother     Past Surgical History:  Procedure Laterality Date  . CESAREAN SECTION     x 3   . CESAREAN SECTION N/A 08/11/2015   Procedure: REPEAT CESAREAN SECTION;  Surgeon: Truett Mainland, DO;  Location: [facility];  Service: Obstetrics;  Laterality: N/A;  . CESAREAN SECTION N/A 09/19/2018   Procedure: CESAREAN SECTION;  Surgeon: Osborne Oman, MD;  Location: [facility];  Service: Obstetrics;  Laterality: N/A;  . CHOLECYSTECTOMY N/A 03/21/2016   Procedure: LAPAROSCOPIC CHOLECYSTECTOMY;  Surgeon: Ralene Ok, MD;  Location: [facility];  Service: General;  Laterality: N/A;    ROS: Review of Systems Negative except as stated above  PHYSICAL EXAM: BP 130/81 (BP Location: Left Arm, Patient Position: Sitting, Cuff Size: Normal)   Pulse 85   Temp 98.1 F (36.7 C)   Resp 18   Ht  5' 0.98" (1.549 m)   Wt 191 lb 9.6 oz (86.9 kg)   SpO2 98%   BMI 36.22 kg/m   Physical Exam HENT:     Head: Normocephalic and atraumatic.  Eyes:     Extraocular Movements: Extraocular movements intact.     Conjunctiva/sclera: Conjunctivae normal.     Pupils: Pupils are equal, round, and reactive to light.  Cardiovascular:     Rate and Rhythm: Normal rate and regular rhythm.     Pulses: Normal pulses.     Heart sounds: Normal heart sounds.  Pulmonary:     Effort: Pulmonary effort is normal.     Breath sounds: Normal breath sounds.  Musculoskeletal:     Cervical back: Normal range of motion and  neck supple.  Neurological:     General: No focal deficit present.     Mental Status: She is alert and oriented to person, place, and time.  Psychiatric:        Mood and Affect: Mood normal.        Behavior: Behavior normal.     ASSESSMENT AND PLAN: 1. Encounter for Depo-Provera contraception: - Medroxyprogesterone (Depo-Provera) injection administered today in office.  - Return in 12 weeks for repeat injection. - Follow-up with primary provider as needed.    Patient was given the opportunity to ask questions.  Patient verbalized understanding of the plan and was able to repeat key elements of the plan. Patient was given clear instructions to go to Emergency Department or return to medical center if symptoms don't improve, worsen, or new problems develop.The patient verbalized understanding.   Return in about 3 months (around 05/31/2021) for Follow-Up Depo-Provera injection nurse visit .  Camillia Herter, NP

## 2021-03-13 ENCOUNTER — Telehealth: Payer: Self-pay | Admitting: Family

## 2021-03-13 NOTE — Telephone Encounter (Signed)
1) Medication(s) Requested (by name):  amLODipine (NORVASC) 5 MG tablet   2) Pharmacy of Choice:  [facility] [address] ([address]), [place]  [address] [place]) [place] [address]  Phone:  [phone]  Fax:  [phone]               3) Special Requests:   Approved medications will be sent to the pharmacy, we will reach out if there is an issue.  Requests made after 3pm may not be addressed until the following business day!  If a patient is unsure of the name of the medication(s) please note and ask patient to call back when they are able to provide all info, do not send to responsible party until all information is available!

## 2021-03-14 ENCOUNTER — Other Ambulatory Visit: Payer: Self-pay

## 2021-03-14 DIAGNOSIS — I10 Essential (primary) hypertension: Secondary | ICD-10-CM

## 2021-03-14 MED ORDER — AMLODIPINE BESYLATE 5 MG PO TABS: 5.0000 mg | ORAL_TABLET | Freq: Every day | ORAL | 1 refills | Status: DC

## 2021-03-14 NOTE — Progress Notes (Signed)
Amlodipine refilled per pt request.Next appt scheduled for 9/7

## 2021-03-14 NOTE — Telephone Encounter (Signed)
Amlodipine refilled per pt request

## 2021-05-28 NOTE — Progress Notes (Signed)
Erroneous encounter

## 2021-05-30 ENCOUNTER — Other Ambulatory Visit: Payer: Self-pay

## 2021-05-30 ENCOUNTER — Inpatient Hospital Stay: Payer: 59 | Attending: Hematology and Oncology | Admitting: Hematology and Oncology

## 2021-05-30 ENCOUNTER — Other Ambulatory Visit: Payer: Self-pay | Admitting: Hematology and Oncology

## 2021-05-30 ENCOUNTER — Encounter: Payer: Self-pay | Admitting: Hematology and Oncology

## 2021-05-30 ENCOUNTER — Inpatient Hospital Stay: Payer: 59

## 2021-05-30 DIAGNOSIS — D500 Iron deficiency anemia secondary to blood loss (chronic): Secondary | ICD-10-CM | POA: Diagnosis not present

## 2021-05-30 DIAGNOSIS — Z7984 Long term (current) use of oral hypoglycemic drugs: Secondary | ICD-10-CM | POA: Insufficient documentation

## 2021-05-30 DIAGNOSIS — Z79899 Other long term (current) drug therapy: Secondary | ICD-10-CM | POA: Insufficient documentation

## 2021-05-30 DIAGNOSIS — D72829 Elevated white blood cell count, unspecified: Secondary | ICD-10-CM | POA: Diagnosis not present

## 2021-05-30 DIAGNOSIS — D751 Secondary polycythemia: Secondary | ICD-10-CM | POA: Insufficient documentation

## 2021-05-30 DIAGNOSIS — D75839 Thrombocytosis, unspecified: Secondary | ICD-10-CM

## 2021-05-30 DIAGNOSIS — L089 Local infection of the skin and subcutaneous tissue, unspecified: Secondary | ICD-10-CM

## 2021-05-30 LAB — CBC WITH DIFFERENTIAL (CANCER CENTER ONLY)
Abs Immature Granulocytes: 0.06 10*3/uL (ref 0.00–0.07)
Basophils Absolute: 0 10*3/uL (ref 0.0–0.1)
Basophils Relative: 0 %
Eosinophils Absolute: 0.2 10*3/uL (ref 0.0–0.5)
Eosinophils Relative: 2 %
HCT: 36.2 % (ref 36.0–46.0)
Hemoglobin: 11.7 g/dL — ABNORMAL LOW (ref 12.0–15.0)
Immature Granulocytes: 1 %
Lymphocytes Relative: 27 %
Lymphs Abs: 2.4 10*3/uL (ref 0.7–4.0)
MCH: 26.7 pg (ref 26.0–34.0)
MCHC: 32.3 g/dL (ref 30.0–36.0)
MCV: 82.5 fL (ref 80.0–100.0)
Monocytes Absolute: 0.5 10*3/uL (ref 0.1–1.0)
Monocytes Relative: 5 %
Neutro Abs: 5.9 10*3/uL (ref 1.7–7.7)
Neutrophils Relative %: 65 %
Platelet Count: 478 10*3/uL — ABNORMAL HIGH (ref 150–400)
RBC: 4.39 MIL/uL (ref 3.87–5.11)
RDW: 13.4 % (ref 11.5–15.5)
WBC Count: 9.1 10*3/uL (ref 4.0–10.5)
nRBC: 0 % (ref 0.0–0.2)

## 2021-05-30 LAB — IRON AND TIBC
Iron: 53 ug/dL (ref 41–142)
Saturation Ratios: 18 % — ABNORMAL LOW (ref 21–57)
TIBC: 296 ug/dL (ref 236–444)
UIBC: 243 ug/dL (ref 120–384)

## 2021-05-30 LAB — FERRITIN: Ferritin: 38 ng/mL (ref 11–307)

## 2021-05-30 LAB — SEDIMENTATION RATE: Sed Rate: 30 mm/hr — ABNORMAL HIGH (ref 0–22)

## 2021-05-30 NOTE — Assessment & Plan Note (Signed)
She has reactive thrombocytosis likely secondary to iron deficiency Observe only

## 2021-05-30 NOTE — Assessment & Plan Note (Signed)
She is noted to have mild iron deficiency anemia, likely secondary to chronic GI loss I recommend referral to gastroenterologist for evaluation and she is in agreement

## 2021-05-30 NOTE — Progress Notes (Signed)
[facility] OFFICE PROGRESS NOTE  Amy Herter, NP  ASSESSMENT & PLAN:  Iron deficiency anemia due to chronic blood loss She is noted to have mild iron deficiency anemia, likely secondary to chronic GI loss I recommend referral to gastroenterologist for evaluation and she is in agreement  Thrombocytosis [facility]) She has reactive thrombocytosis likely secondary to iron deficiency Observe only  Leukocytosis She had intermittent leukocytosis in the past, likely reactive in nature Her leukocytosis is resolved in this visit Observed from  Orders Placed This Encounter  Procedures   Iron and TIBC    Standing Status:   Future    Standing Expiration Date:   05/30/2022   Ferritin    Standing Status:   Future    Standing Expiration Date:   05/30/2022   Sedimentation rate    Standing Status:   Future    Standing Expiration Date:   05/30/2022   CBC with Differential/Platelet    Standing Status:   Future    Standing Expiration Date:   05/30/2022    The total time spent in the appointment was 20 minutes encounter with patients including review of chart and various tests results, discussions about plan of care and coordination of care plan   All questions were answered. The patient knows to call the clinic with any problems, questions or concerns. No barriers to learning was detected.    Amy Lark, MD 9/6/202212:10 PM  INTERVAL HISTORY: Amy Kim 35 y.o. female returns for history of abnormal CBC She is doing well She is currently on Depo injection, has not experienced menstrual cycle for 3 years She has chronic intermittent hemorrhoidal bleeding She does not donate blood She stated she is not a vegetarian or vegan, usually she has a normal diet She denies recent infection, fever or chills She denies taking aspirin or NSAID recent  SUMMARY OF HEMATOLOGIC HISTORY:  She was found to have abnormal CBC from blood work that was being drawn at her primary care doctor's  office I have the opportunity to review his CBC dated back from August 2019. Her white blood cell count ranged from normal at 8.6 to as high as 12.3 Her platelet count ranges from 304-545 She has frequent, recurrent skin infection affecting both axillary region on a regular basis, typically related to changes in her menstrual cycle She just completed a course of antibiotics approximately 4 days ago for a very significant skin infection on her back Currently, she does not have symptoms of sinus congestion, cough, urinary frequency/urgency or dysuria, diarrhea, joint swelling/pain or abnormal skin rash.  She had no prior history or diagnosis of cancer. Her age appropriate screening programs are up-to-date. She does not smoke She denies prior history of blood clots She is being observed The cause of her thrombocytosis and leukocytosis are not deemed reactive in nature  I have reviewed the past medical history, past surgical history, social history and family history with the patient and they are unchanged from previous note.  ALLERGIES:  has No Known Allergies.  MEDICATIONS:  Current Outpatient Medications  Medication Sig Dispense Refill   amLODipine (NORVASC) 5 MG tablet Take 1 tablet (5 mg total) by mouth daily. 90 tablet 1   medroxyPROGESTERone Acetate 150 MG/ML SUSY INJECT 1 ML INTO THE MUSCLE EVERY 3 MONTHS 1 mL 3   metFORMIN (GLUCOPHAGE) 500 MG tablet Take 1 tablet (500 mg total) by mouth 2 (two) times daily with a meal. 180 tablet 1   Current Facility-Administered Medications  Medication Dose Route Frequency Provider Last Rate Last Admin   medroxyPROGESTERone (DEPO-PROVERA) injection 150 mg  150 mg Intramuscular Q90 days [name], MD   150 mg at 02/28/21 1554     REVIEW OF SYSTEMS:   Constitutional: Denies fevers, chills or night sweats Eyes: Denies blurriness of vision Ears, nose, mouth, throat, and face: Denies mucositis or sore throat Respiratory: Denies cough,  dyspnea or wheezes Cardiovascular: Denies palpitation, chest discomfort or lower extremity swelling Gastrointestinal:  Denies nausea, heartburn or change in bowel habits Skin: Denies abnormal skin rashes Lymphatics: Denies new lymphadenopathy or easy bruising Neurological:Denies numbness, tingling or new weaknesses Behavioral/Psych: Mood is stable, no new changes  All other systems were reviewed with the patient and are negative.  PHYSICAL EXAMINATION: ECOG PERFORMANCE STATUS: 0 - Asymptomatic  Vitals:   05/30/21 0841  BP: 139/89  Pulse: 89  Resp: 18  Temp: 97.9 F (36.6 C)  SpO2: 100%   Filed Weights   05/30/21 0841  Weight: 202 lb 9.6 oz (91.9 kg)    GENERAL:alert, no distress and comfortable NEURO: alert & oriented x 3 with fluent speech, no focal motor/sensory deficits  LABORATORY DATA:  I have reviewed the data as listed     Component Value Date/Time   NA 140 12/31/2019 1058   K 4.3 12/31/2019 1058   CL 106 12/31/2019 1058   CO2 19 (L) 12/31/2019 1058   GLUCOSE 112 (H) 12/31/2019 1058   GLUCOSE 83 03/15/2016 1253   GLUCOSE 82 03/30/2015 1120   BUN 13 12/31/2019 1058   CREATININE 0.69 12/31/2019 1058   CALCIUM 9.7 12/31/2019 1058   PROT 7.6 12/31/2019 1058   ALBUMIN 4.4 12/31/2019 1058   AST 14 12/31/2019 1058   ALT 19 12/31/2019 1058   ALKPHOS 90 12/31/2019 1058   BILITOT 0.2 12/31/2019 1058   GFRNONAA 115 12/31/2019 1058   GFRAA 132 12/31/2019 1058    No results found for: SPEP, UPEP  Lab Results  Component Value Date   WBC 9.1 05/30/2021   NEUTROABS 5.9 05/30/2021   HGB 11.7 (L) 05/30/2021   HCT 36.2 05/30/2021   MCV 82.5 05/30/2021   PLT 478 (H) 05/30/2021      Chemistry      Component Value Date/Time   NA 140 12/31/2019 1058   K 4.3 12/31/2019 1058   CL 106 12/31/2019 1058   CO2 19 (L) 12/31/2019 1058   BUN 13 12/31/2019 1058   CREATININE 0.69 12/31/2019 1058      Component Value Date/Time   CALCIUM 9.7 12/31/2019 1058   ALKPHOS  90 12/31/2019 1058   AST 14 12/31/2019 1058   ALT 19 12/31/2019 1058   BILITOT 0.2 12/31/2019 1058

## 2021-05-30 NOTE — Assessment & Plan Note (Signed)
She had intermittent leukocytosis in the past, likely reactive in nature Her leukocytosis is resolved in this visit Observed from

## 2021-05-31 ENCOUNTER — Encounter: Payer: 59 | Admitting: Family

## 2021-05-31 DIAGNOSIS — Z3042 Encounter for surveillance of injectable contraceptive: Secondary | ICD-10-CM

## 2021-06-02 ENCOUNTER — Other Ambulatory Visit: Payer: Self-pay

## 2021-06-02 ENCOUNTER — Ambulatory Visit (INDEPENDENT_AMBULATORY_CARE_PROVIDER_SITE_OTHER): Payer: 59

## 2021-06-02 DIAGNOSIS — Z3042 Encounter for surveillance of injectable contraceptive: Secondary | ICD-10-CM

## 2021-06-02 MED ORDER — MEDROXYPROGESTERONE ACETATE 150 MG/ML IM SUSP
150.0000 mg | Freq: Once | INTRAMUSCULAR | Status: AC
  Administered 2021-06-02: 150 mg via INTRAMUSCULAR

## 2021-06-02 NOTE — Progress Notes (Signed)
Pt presents for depo injection administered in left deltoid. Pt tolerated injection well

## 2021-06-05 ENCOUNTER — Telehealth: Payer: Self-pay

## 2021-06-05 ENCOUNTER — Encounter: Payer: Self-pay | Admitting: Internal Medicine

## 2021-06-05 NOTE — Telephone Encounter (Signed)
-----   Message from [name], MD sent at 06/05/2021  7:52 AM EDT ----- Can you check GI referral?

## 2021-06-05 NOTE — Telephone Encounter (Signed)
Called regarding referral and spoke with office staff. They can see the referral and will call to schedule appt.

## 2021-06-24 NOTE — Progress Notes (Signed)
Patient ID: Amy Kim, female    DOB: 1986-05-03  MRN: [id number]  CC: Diabetes Follow-Up   Subjective: Amy Kim is a 35 y.o. female who presents for diabetes follow-up.   Her concerns today include:   DIABETES TYPE 2 FOLLOW-UP: 02/08/2021 per DO note: Unable to perform POCT A1c due to equipment malfunction. Send out A1c and monitor microalbumin. Discussed with patient that with last result had recommended increase in Metformin. Suspect this will likely still be case but will monitor to determine.   06/28/2021: Doing well on current regimen. No side effects. No issues/concerns.  Med Adherence:  [x]  Yes    []  No Medication side effects:  []  Yes    [x]  No  2. HYPERTENSION FOLLOW-UP: 02/08/2021 per DO note: BP well controlled. Asymptomatic. Continue current regimen.   06/28/2021: Doing well on current regimen. No side effects. No issues/concerns. Denies chest pain and shortness of breath. Reports not checking blood pressures at home but when at doctor's appointments always normal.   Patient Active Problem List   Diagnosis Date Noted   Iron deficiency anemia due to chronic blood loss 05/30/2021   Class II obesity 08/26/2020   New onset type 2 diabetes mellitus ([place]) 01/13/2020   Thrombocytosis 01/13/2020   Essential hypertension 12/17/2019   Refusal of blood transfusions as patient is Jehovah's Witness 09/18/2018   Abdominal cyst 03/29/2015   History of C-section 03/24/2015     No current outpatient medications on file prior to visit.   No current facility-administered medications on file prior to visit.    Not on File  Social History   Socioeconomic History   Marital status: Married    Spouse name: Not on file   Number of children: Not on file   Years of education: Not on file   Highest education level: Not on file  Occupational History   Not on file  Tobacco Use   Smoking status: Never   Smokeless tobacco: Never  Vaping Use   Vaping Use: Never  used  Substance and Sexual Activity   Alcohol use: Not Currently    Comment: occaionally   Drug use: No   Sexual activity: Yes    Birth control/protection: Injection    Comment: Medroxyprogesterone   Other Topics Concern   Not on file  Social History Narrative   Not on file   Social Determinants of Health   Financial Resource Strain: Not on file  Food Insecurity: Not on file  Transportation Needs: Not on file  Physical Activity: Not on file  Stress: Not on file  Social Connections: Not on file  Intimate Partner Violence: Not on file    Family History  Problem Relation Age of Onset   Hypertension Mother    Hypertension Father    Heart disease Paternal Grandmother    Colon cancer Neg Hx    Esophageal cancer Neg Hx    Pancreatic cancer Neg Hx    Stomach cancer Neg Hx    Liver disease Neg Hx     Past Surgical History:  Procedure Laterality Date   CESAREAN SECTION     x 4 total   CESAREAN SECTION N/A 08/11/2015   Procedure: REPEAT CESAREAN SECTION;  Surgeon: Truett Mainland, DO;  Location: [facility];  Service: Obstetrics;  Laterality: N/A;   CESAREAN SECTION N/A 09/19/2018   Procedure: CESAREAN SECTION;  Surgeon: Osborne Oman, MD;  Location: [facility];  Service: Obstetrics;  Laterality: N/A;   CHOLECYSTECTOMY N/A 03/21/2016  Procedure: LAPAROSCOPIC CHOLECYSTECTOMY;  Surgeon: Ralene Ok, MD;  Location: [facility];  Service: General;  Laterality: N/A;    ROS: Review of Systems Negative except as stated above  PHYSICAL EXAM: BP 133/87   Pulse 79   Resp 20   Ht 5\' 1"  (1.549 m)   Wt 195 lb (88.5 kg)   SpO2 98%   BMI 36.84 kg/m  Physical Exam HENT:     Head: Normocephalic and atraumatic.  Eyes:     Extraocular Movements: Extraocular movements intact.     Conjunctiva/sclera: Conjunctivae normal.     Pupils: Pupils are equal, round, and reactive to light.  Cardiovascular:     Rate and Rhythm: Normal rate and regular rhythm.     Pulses: Normal  pulses.     Heart sounds: Normal heart sounds.  Pulmonary:     Effort: Pulmonary effort is normal.     Breath sounds: Normal breath sounds.  Musculoskeletal:     Cervical back: Normal range of motion and neck supple.  Neurological:     General: No focal deficit present.     Mental Status: She is alert and oriented to person, place, and time.  Psychiatric:        Mood and Affect: Mood normal.        Behavior: Behavior normal.    Results for orders placed or performed in visit on 06/28/21  POCT glycosylated hemoglobin (Hb A1C)  Result Value Ref Range   Hemoglobin A1C     HbA1c POC (<> result, manual entry)     HbA1c, POC (prediabetic range)     HbA1c, POC (controlled diabetic range) 6.6 0.0 - 7.0 %     ASSESSMENT AND PLAN: 1. Type 2 diabetes mellitus without complication, without long-term current use of insulin ([place]): - Hemoglobin A1c today at goal at 6.6%, goal < 7%. This is decreased from previous of 7.0% on 02/08/2021. Next hemoglobin A1c due January 2023.  - Continue Metformin as prescribed.  - Discussed the importance of healthy eating habits, low-carbohydrate diet, low-sugar diet, regular aerobic exercise (at least 150 minutes a week as tolerated) and medication compliance to achieve or maintain control of diabetes. - Follow-up with primary provider in 3 months or sooner if needed. - POCT glycosylated hemoglobin (Hb A1C) - metFORMIN (GLUCOPHAGE) 500 MG tablet; Take 1 tablet (500 mg total) by mouth 2 (two) times daily with a meal.  Dispense: 180 tablet; Refill: 0  2. Essential hypertension: - Continue Amlodipine as prescribed.  - Counseled on blood pressure goal of less than 130/80, low-sodium, DASH diet, medication compliance, 150 minutes of moderate intensity exercise per week as tolerated. Discussed medication compliance, adverse effects. - Follow-up with primary provider in 3 months or sooner if needed.  - amLODipine (NORVASC) 5 MG tablet; Take 1 tablet (5 mg total) by  mouth daily.  Dispense: 90 tablet; Refill: 0   Patient was given the opportunity to ask questions.  Patient verbalized understanding of the plan and was able to repeat key elements of the plan. Patient was given clear instructions to go to Emergency Department or return to medical center if symptoms don't improve, worsen, or new problems develop.The patient verbalized understanding.   Orders Placed This Encounter  Procedures   POCT glycosylated hemoglobin (Hb A1C)    Requested Prescriptions   Signed Prescriptions Disp Refills   amLODipine (NORVASC) 5 MG tablet 90 tablet 0    Sig: Take 1 tablet (5 mg total) by mouth daily.   metFORMIN (GLUCOPHAGE)  500 MG tablet 180 tablet 0    Sig: Take 1 tablet (500 mg total) by mouth 2 (two) times daily with a meal.    Return in about 3 months (around 09/28/2021) for Follow-Up or next available hypertension and diabetes.  Camillia Herter, NP

## 2021-06-26 ENCOUNTER — Encounter: Payer: Self-pay | Admitting: Internal Medicine

## 2021-06-26 ENCOUNTER — Ambulatory Visit (INDEPENDENT_AMBULATORY_CARE_PROVIDER_SITE_OTHER): Payer: 59 | Admitting: Internal Medicine

## 2021-06-26 VITALS — BP 120/86 | HR 76 | Ht 61.0 in | Wt 199.8 lb

## 2021-06-26 DIAGNOSIS — D509 Iron deficiency anemia, unspecified: Secondary | ICD-10-CM | POA: Diagnosis not present

## 2021-06-26 DIAGNOSIS — K625 Hemorrhage of anus and rectum: Secondary | ICD-10-CM | POA: Diagnosis not present

## 2021-06-26 NOTE — Patient Instructions (Signed)
You have been scheduled for an endoscopy and colonoscopy. Please follow the written instructions given to you at your visit today. Please pick up your prep supplies at the pharmacy within the next 1-3 days. If you use inhalers (even only as needed), please bring them with you on the day of your procedure.  If you are age 34 or older, your body mass index should be between 23-30. Your Body mass index is 37.75 kg/m. If this is out of the aforementioned range listed, please consider follow up with your Primary Care Provider.  If you are age 19 or younger, your body mass index should be between 19-25. Your Body mass index is 37.75 kg/m. If this is out of the aformentioned range listed, please consider follow up with your Primary Care Provider.   ________________________________________________________  The [facility] providers would like to encourage you to use [facility] to communicate with providers for non-urgent requests or questions.  Due to long hold times on the telephone, sending your provider a message by [facility] may be a faster and more efficient way to get a response.  Please allow 48 business hours for a response.  Please remember that this is for non-urgent requests.   Due to recent changes in healthcare laws, you may see the results of your imaging and laboratory studies on MyChart before your provider has had a chance to review them.  We understand that in some cases there may be results that are confusing or concerning to you. Not all laboratory results come back in the same time frame and the provider may be waiting for multiple results in order to interpret others.  Please give [place] 48 hours in order for your provider to thoroughly review all the results before contacting the office for clarification of your results.

## 2021-06-26 NOTE — Progress Notes (Signed)
Chief Complaint: IDA  HPI :  35 year old female with history of DM, migraines, IDA presents with IDA  Endorses rectal bleeding for the last 6 months. She states that she sees small blood clots in her stools.  The blood clots are dark red.  Denies melena or rectal pain.  Denies diarrhea, constipation, weight loss, dysphagia, odynophagia, nausea, vomiting, abdominal pain. Denies fam hx of GI cancers. Denies EGD or colonoscopy. Not having any periods because on Depo Provera. Denies use of blood thinners. Has had multiple C-sections in the past. Takes ibuprofen for a migraine once a month.  Past Medical History:  Diagnosis Date   Back pain    DDD   Diabetes ([place])    History of migraine    last one 2 days ago   IDA (iron deficiency anemia)    Pregnancy induced hypertension    Urinary frequency     Past Surgical History:  Procedure Laterality Date   CESAREAN SECTION     x 4 total   CESAREAN SECTION N/A 08/11/2015   Procedure: REPEAT CESAREAN SECTION;  Surgeon: Truett Mainland, DO;  Location: [facility];  Service: Obstetrics;  Laterality: N/A;   CESAREAN SECTION N/A 09/19/2018   Procedure: CESAREAN SECTION;  Surgeon: Osborne Oman, MD;  Location: [facility];  Service: Obstetrics;  Laterality: N/A;   CHOLECYSTECTOMY N/A 03/21/2016   Procedure: LAPAROSCOPIC CHOLECYSTECTOMY;  Surgeon: Ralene Ok, MD;  Location: [facility];  Service: General;  Laterality: N/A;   Family History  Problem Relation Age of Onset   Hypertension Mother    Hypertension Father    Heart disease Paternal Grandmother    Colon cancer Neg Hx    Esophageal cancer Neg Hx    Pancreatic cancer Neg Hx    Stomach cancer Neg Hx    Liver disease Neg Hx    Social History   Tobacco Use   Smoking status: Never   Smokeless tobacco: Never  Vaping Use   Vaping Use: Never used  Substance Use Topics   Alcohol use: Not Currently    Comment: occaionally   Drug use: No   Current Outpatient Medications   Medication Sig Dispense Refill   amLODipine (NORVASC) 5 MG tablet Take 1 tablet (5 mg total) by mouth daily. 90 tablet 1   metFORMIN (GLUCOPHAGE) 500 MG tablet Take 1 tablet (500 mg total) by mouth 2 (two) times daily with a meal. 180 tablet 1   No current facility-administered medications for this visit.   No Known Allergies  Review of Systems: All systems reviewed and negative except where noted in HPI.   Physical Exam: BP 120/86   Pulse 76   Ht 5\' 1"  (1.549 m)   Wt 199 lb 12.8 oz (90.6 kg)   BMI 37.75 kg/m  Constitutional: Pleasant,well-developed, female in no acute distress. HEENT: Normocephalic and atraumatic. Conjunctivae are normal. No scleral icterus. Cardiovascular: Normal rate, regular rhythm.  Pulmonary/chest: Effort normal and breath sounds normal. No wheezing, rales or rhonchi. Abdominal: Soft, nondistended, nontender. Bowel sounds active throughout. There are no masses palpable. No hepatomegaly. Extremities: No edema Neurological: Alert and oriented to person place and time. Skin: Skin is warm and dry. No rashes noted. Psychiatric: Normal mood and affect. Behavior is normal.  Labs 05/2021: Hb 11.7 (L), ferritin 38, iron sat 18%  ASSESSMENT AND PLAN: IDA Rectal bleeding Patient presents with IDA, confirmed on recent labs. Patient is currently following with hematology. Describes rectal bleeding for the last 6 months,  which is concerning for a GI source of blood loss. Currently takes vitamin supplements with iron. Will plan for further assessment with EGD and colonoscopy.  - EGD/colonoscopy [facility]  [name], MD

## 2021-06-28 ENCOUNTER — Other Ambulatory Visit: Payer: Self-pay

## 2021-06-28 ENCOUNTER — Ambulatory Visit (INDEPENDENT_AMBULATORY_CARE_PROVIDER_SITE_OTHER): Payer: 59 | Admitting: Family

## 2021-06-28 ENCOUNTER — Encounter: Payer: Self-pay | Admitting: Family

## 2021-06-28 VITALS — BP 133/87 | HR 79 | Resp 20 | Ht 61.0 in | Wt 195.0 lb

## 2021-06-28 DIAGNOSIS — I10 Essential (primary) hypertension: Secondary | ICD-10-CM | POA: Diagnosis not present

## 2021-06-28 DIAGNOSIS — E119 Type 2 diabetes mellitus without complications: Secondary | ICD-10-CM

## 2021-06-28 LAB — POCT GLYCOSYLATED HEMOGLOBIN (HGB A1C): HbA1c, POC (controlled diabetic range): 6.6 % (ref 0.0–7.0)

## 2021-06-28 MED ORDER — AMLODIPINE BESYLATE 5 MG PO TABS: 5.0000 mg | ORAL_TABLET | Freq: Every day | ORAL | 0 refills | Status: DC

## 2021-06-28 MED ORDER — METFORMIN HCL 500 MG PO TABS: 500.0000 mg | ORAL_TABLET | Freq: Two times a day (BID) | ORAL | 0 refills | Status: DC

## 2021-07-19 ENCOUNTER — Encounter: Payer: Self-pay | Admitting: Certified Registered Nurse Anesthetist

## 2021-07-20 ENCOUNTER — Other Ambulatory Visit: Payer: Self-pay

## 2021-07-20 ENCOUNTER — Encounter: Payer: Self-pay | Admitting: Internal Medicine

## 2021-07-20 ENCOUNTER — Ambulatory Visit (AMBULATORY_SURGERY_CENTER): Payer: 59 | Admitting: Internal Medicine

## 2021-07-20 VITALS — BP 158/92 | HR 68 | Temp 99.1°F | Resp 21 | Ht 61.0 in | Wt 199.0 lb

## 2021-07-20 DIAGNOSIS — K5730 Diverticulosis of large intestine without perforation or abscess without bleeding: Secondary | ICD-10-CM | POA: Diagnosis not present

## 2021-07-20 DIAGNOSIS — K649 Unspecified hemorrhoids: Secondary | ICD-10-CM | POA: Diagnosis not present

## 2021-07-20 DIAGNOSIS — C187 Malignant neoplasm of sigmoid colon: Secondary | ICD-10-CM | POA: Diagnosis not present

## 2021-07-20 DIAGNOSIS — K2950 Unspecified chronic gastritis without bleeding: Secondary | ICD-10-CM | POA: Diagnosis not present

## 2021-07-20 DIAGNOSIS — D509 Iron deficiency anemia, unspecified: Secondary | ICD-10-CM | POA: Diagnosis not present

## 2021-07-20 DIAGNOSIS — D128 Benign neoplasm of rectum: Secondary | ICD-10-CM

## 2021-07-20 DIAGNOSIS — K259 Gastric ulcer, unspecified as acute or chronic, without hemorrhage or perforation: Secondary | ICD-10-CM

## 2021-07-20 DIAGNOSIS — K2970 Gastritis, unspecified, without bleeding: Secondary | ICD-10-CM | POA: Diagnosis not present

## 2021-07-20 DIAGNOSIS — B9681 Helicobacter pylori [H. pylori] as the cause of diseases classified elsewhere: Secondary | ICD-10-CM

## 2021-07-20 DIAGNOSIS — K625 Hemorrhage of anus and rectum: Secondary | ICD-10-CM

## 2021-07-20 DIAGNOSIS — D125 Benign neoplasm of sigmoid colon: Secondary | ICD-10-CM

## 2021-07-20 MED ORDER — DEXTROSE 5 % IV SOLN: INTRAVENOUS | Status: DC

## 2021-07-20 MED ORDER — SODIUM CHLORIDE 0.9 % IV SOLN: 500.0000 mL | Freq: Once | INTRAVENOUS | Status: DC

## 2021-07-20 NOTE — Progress Notes (Signed)
1515 Patient experiencing nausea and vomiting.  MD updated and Zofran 4 mg IV given, vss

## 2021-07-20 NOTE — Op Note (Signed)
[name]: Amy Kim Procedure Date: 07/20/2021 2:17 PM MRN: [id number] Endoscopist: Sonny Masters "Amy Kim ,  Age: 35 Referring MD:  Date of Birth: [birth year] Gender: Female Account #: [id number] Procedure:                Colonoscopy Indications:              Iron deficiency anemia Medicines:                Monitored Anesthesia Care Procedure:                Pre-Anesthesia Assessment:                           - Prior to the procedure, a History and Physical                            was performed, and patient medications and                            allergies were reviewed. The patient's tolerance of                            previous anesthesia was also reviewed. The risks                            and benefits of the procedure and the sedation                            options and risks were discussed with the patient.                            All questions were answered, and informed consent                            was obtained. Prior Anticoagulants: The patient has                            taken no previous anticoagulant or antiplatelet                            agents. ASA Grade Assessment: II - A patient with                            mild systemic disease. After reviewing the risks                            and benefits, the patient was deemed in                            satisfactory condition to undergo the procedure.                           After obtaining informed consent, the colonoscope  was passed under direct vision. Throughout the                            procedure, the patient's blood pressure, pulse, and                            oxygen saturations were monitored continuously. The                            Olympus [id number]) Colonoscope was                            introduced through the anus and advanced to the the                            terminal ileum. The colonoscopy  was performed                            without difficulty. The patient tolerated the                            procedure well. The quality of the bowel                            preparation was good. Scope In: 2:54:16 PM Scope Out: 4:07:13 PM Scope Withdrawal Time: 1 hour 6 minutes 22 seconds  Total Procedure Duration: 1 hour 12 minutes 57 seconds  Findings:                 The terminal ileum appeared normal.                           A few diverticula were found in the entire colon.                           A 30 mm polyp was found in the sigmoid colon at 20                            cm proximal to the anus. The polyp was pedunculated                            on a thick stalk and had stigmata of bleeding.                            Endoloop was attempted but due to limited space,                            the loop would not open adequately. To prevent                            post-polypectomy bleeding, one hemostatic clip was                            successfully placed  at the polyp stalk below the                            area of resection. Preparations were made for                            mucosal resection. A 1:10,000 solution of                            epinephrine was injected to raise the lesion. Hot                            snare mucosal resection was performed. Resection                            and retrieval were complete. There was no bleeding                            at the end of the procedure.                           A 6 mm polyp was found in the rectum. The polyp was                            sessile. The polyp was removed with a cold snare.                            Resection and retrieval were complete.                           Non-bleeding internal hemorrhoids were found during                            retroflexion. Complications:            No immediate complications. Estimated Blood Loss:     Estimated blood loss was  minimal. Impression:               - The examined portion of the ileum was normal.                           - Diverticulosis in the entire examined colon.                           - One 30 mm polyp in the sigmoid colon at 20 cm                            proximal to the anus, removed with mucosal                            resection. Clip was placed and epi injection was                            performed prior to resection. Resected and  retrieved.                           - One 6 mm polyp in the rectum, removed with a cold                            snare. Resected and retrieved.                           - Non-bleeding internal hemorrhoids.                           - Mucosal resection was performed. Resection and                            retrieval were complete. Recommendation:           - Discharge patient to home (with escort).                           - It is suspected that your iron deficiency anemia                            was due to the large sigmoid polyp that was removed                            today.                           - Await pathology results.                           - The findings and recommendations were discussed                            with the patient. [address] RoadChristia Kim,  07/20/2021 4:22:25 PM

## 2021-07-20 NOTE — Op Note (Signed)
[name] Procedure Date: 07/20/2021 2:18 PM MRN: [id number] Endoscopist: Sonny Masters "[name] ,  Age: 35 Referring MD:  Date of Birth: 12/15/1985 Gender: Female Account #: [id number] Procedure:                Upper GI endoscopy Indications:              Iron deficiency anemia Medicines:                Monitored Anesthesia Care Procedure:                After obtaining informed consent, the endoscope was                            passed under direct vision. Throughout the                            procedure, the patient's blood pressure, pulse, and                            oxygen saturations were monitored continuously. The                            Endoscope was introduced through the mouth, and                            advanced to the second part of duodenum. Scope In: Scope Out: Findings:                 The examined esophagus was normal.                           A few localized erosions with stigmata of recent                            bleeding were found in the gastric antrum. Biopsies                            were taken with a cold forceps for Helicobacter                            pylori testing.                           The examined duodenum was normal. Biopsies for                            histology were taken with a cold forceps for                            evaluation of celiac disease. Complications:            No immediate complications. Estimated Blood Loss:     Estimated blood loss was minimal. Impression:               - Normal esophagus.                           -  Erosive gastropathy with stigmata of recent                            bleeding. Biopsied.                           - Normal examined duodenum. Biopsied. Recommendation:           - Await pathology results.                           - Perform a colonoscopy today. Sonny Masters "[name],  07/20/2021 4:11:50 PM

## 2021-07-20 NOTE — Patient Instructions (Addendum)
Handouts were given to your care partner on polyps, diverticulosis, and a high fiber diet with liberal fluid intake Your sugar was 97 in the recovery room. A metal clip was placed in your sigmoid colon x1.  You possibly will see this in your stool when the clip sluffs off the sigmoid colon.  If you have to have a MRI for any reason please show them the clip card that was given to you the day of your colonoscopy.  I recommend to place this clip card in your wallet, so you can keep up with it.   You may resume your current medications today. Await biopsy results.  May take 1-3 weeks to receive pathology results. NO ASPIRIN, ASPIRIN CONTAINING PRODUCTS (BC OR GOODY POWDERS) OR NSAIDS (IBUPROFEN, ADVIL, ALEVE, AND MOTRIN) FOR 2 weeks; TYLENOL IS OK TO TAKE. Please call if any questions or concerns.      YOU HAD AN ENDOSCOPIC PROCEDURE TODAY AT [facility]:   Refer to the procedure report that was given to you for any specific questions about what was found during the examination.  If the procedure report does not answer your questions, please call your gastroenterologist to clarify.  If you requested that your care partner not be given the details of your procedure findings, then the procedure report has been included in a sealed envelope for you to review at your convenience later.  YOU SHOULD EXPECT: Some feelings of bloating in the abdomen. Passage of more gas than usual.  Walking can help get rid of the air that was put into your GI tract during the procedure and reduce the bloating. If you had a lower endoscopy (such as a colonoscopy or flexible sigmoidoscopy) you may notice spotting of blood in your stool or on the toilet paper. If you underwent a bowel prep for your procedure, you may not have a normal bowel movement for a few days.  Please Note:  You might notice some irritation and congestion in your nose or some drainage.  This is from the oxygen used during your procedure.   There is no need for concern and it should clear up in a day or so.  SYMPTOMS TO REPORT IMMEDIATELY:  Following lower endoscopy (colonoscopy or flexible sigmoidoscopy):  Excessive amounts of blood in the stool  Significant tenderness or worsening of abdominal pains  Swelling of the abdomen that is new, acute  Fever of 100F or higher  Following upper endoscopy (EGD)  Vomiting of blood or coffee ground material  New chest pain or pain under the shoulder blades  Painful or persistently difficult swallowing  New shortness of breath  Fever of 100F or higher  Black, tarry-looking stools  For urgent or emergent issues, a gastroenterologist can be reached at any hour by calling [phone]. Do not use MyChart messaging for urgent concerns.    DIET:  We do recommend a small meal at first, but then you may proceed to your regular diet.  Drink plenty of fluids but you should avoid alcoholic beverages for 24 hours.  ACTIVITY:  You should plan to take it easy for the rest of today and you should NOT DRIVE or use heavy machinery until tomorrow (because of the sedation medicines used during the test).    FOLLOW UP: Our staff will call the number listed on your records 48-72 hours following your procedure to check on you and address any questions or concerns that you may have regarding the information given to you  following your procedure. If we do not reach you, we will leave a message.  We will attempt to reach you two times.  During this call, we will ask if you have developed any symptoms of COVID 19. If you develop any symptoms (ie: fever, flu-like symptoms, shortness of breath, cough etc.) before then, please call [phone].  If you test positive for Covid 19 in the 2 weeks post procedure, please call and report this information to [place].    If any biopsies were taken you will be contacted by phone or by letter within the next 1-3 weeks.  Please call us at [phone] if you have not  heard about the biopsies in 3 weeks.    SIGNATURES/CONFIDENTIALITY: You and/or your care partner have signed paperwork which will be entered into your electronic medical record.  These signatures attest to the fact that that the information above on your After Visit Summary has been reviewed and is understood.  Full responsibility of the confidentiality of this discharge information lies with you and/or your care-partner.

## 2021-07-20 NOTE — Progress Notes (Signed)
Vitals done by DT.

## 2021-07-20 NOTE — Progress Notes (Signed)
GASTROENTEROLOGY PROCEDURE H&P NOTE   Primary Care Physician: Camillia Herter, NP    Reason for Procedure:   IDA  Plan:    EGD/colon  Patient is appropriate for endoscopic procedure(s) in the ambulatory ([facility]) setting.  The nature of the procedure, as well as the risks, benefits, and alternatives were carefully and thoroughly reviewed with the patient. Ample time for discussion and questions allowed. The patient understood, was satisfied, and agreed to proceed.     HPI: Amy Kim is a 35 y.o. female who presents for EGD/colon for evaluation of IDA .  Patient was most recently seen in the Gastroenterology Clinic on 06/26/21.  No interval change in medical history since that appointment. Please refer to that note for full details regarding GI history and clinical presentation.   Past Medical History:  Diagnosis Date   Back pain    DDD   Diabetes ([place])    History of migraine    last one 2 days ago   IDA (iron deficiency anemia)    Pregnancy induced hypertension    Urinary frequency     Past Surgical History:  Procedure Laterality Date   CESAREAN SECTION     x 4 total   CESAREAN SECTION N/A 08/11/2015   Procedure: REPEAT CESAREAN SECTION;  Surgeon: Truett Mainland, DO;  Location: [facility];  Service: Obstetrics;  Laterality: N/A;   CESAREAN SECTION N/A 09/19/2018   Procedure: CESAREAN SECTION;  Surgeon: Osborne Oman, MD;  Location: [facility];  Service: Obstetrics;  Laterality: N/A;   CHOLECYSTECTOMY N/A 03/21/2016   Procedure: LAPAROSCOPIC CHOLECYSTECTOMY;  Surgeon: Ralene Ok, MD;  Location: [facility];  Service: General;  Laterality: N/A;    Prior to Admission medications   Medication Sig Start Date End Date Taking? Authorizing Provider  amLODipine (NORVASC) 5 MG tablet Take 1 tablet (5 mg total) by mouth daily. 06/28/21   Camillia Herter, NP  metFORMIN (GLUCOPHAGE) 500 MG tablet Take 1 tablet (500 mg total) by mouth 2 (two) times daily with a meal.  06/28/21   Camillia Herter, NP    Current Outpatient Medications  Medication Sig Dispense Refill   amLODipine (NORVASC) 5 MG tablet Take 1 tablet (5 mg total) by mouth daily. 90 tablet 0   metFORMIN (GLUCOPHAGE) 500 MG tablet Take 1 tablet (500 mg total) by mouth 2 (two) times daily with a meal. 180 tablet 0   No current facility-administered medications for this visit.    Allergies as of 07/20/2021   (Not on File)    Family History  Problem Relation Age of Onset   Hypertension Mother    Hypertension Father    Heart disease Paternal Grandmother    Colon cancer Neg Hx    Esophageal cancer Neg Hx    Pancreatic cancer Neg Hx    Stomach cancer Neg Hx    Liver disease Neg Hx     Social History   Socioeconomic History   Marital status: Married    Spouse name: Not on file   Number of children: Not on file   Years of education: Not on file   Highest education level: Not on file  Occupational History   Not on file  Tobacco Use   Smoking status: Never   Smokeless tobacco: Never  Vaping Use   Vaping Use: Never used  Substance and Sexual Activity   Alcohol use: Not Currently    Comment: occaionally   Drug use: No   Sexual activity: Yes  Birth control/protection: Injection    Comment: Medroxyprogesterone   Other Topics Concern   Not on file  Social History Narrative   Not on file   Social Determinants of Health   Financial Resource Strain: Not on file  Food Insecurity: Not on file  Transportation Needs: Not on file  Physical Activity: Not on file  Stress: Not on file  Social Connections: Not on file  Intimate Partner Violence: Not on file    Physical Exam: Vital signs in last 24 hours: There were no vitals taken for this visit. GEN: NAD EYE: Sclerae anicteric ENT: MMM CV: Non-tachycardic Pulm: No increased WOB GI: Soft NEURO:  Alert & Oriented   [name], MD [facility]   07/20/2021 2:11 PM

## 2021-07-20 NOTE — Progress Notes (Signed)
Called to room to assist during endoscopic procedure.  Patient ID and intended procedure confirmed with present staff. Received instructions for my participation in the procedure from the performing physician.  

## 2021-07-20 NOTE — Progress Notes (Signed)
1441 Robinul 0.1 mg IV given due large amount of secretions upon assessment.  MD made aware, vss

## 2021-07-20 NOTE — Progress Notes (Signed)
Vocal order from Dr. Lorenso Courier for pt, "NO ASPIRIN, ASPIRIN CONTAINING PRODUCTS (BC OR GOODY POWDERS) OR NSAIDS (IBUPROFEN, ADVIL, ALEVE, AND MOTRIN) FOR 2 weeks; TYLENOL IS OK TO TAKE ".  Clip card was given to pt to place in her wallet.  If for any reason she needs to have a MRI scheduled, she should show the clip card first.  She may need a x-ray to confirm  the clip is released and not in the colon.  Card was given to pt and her mother.  No problems noted in the recovery room. maw

## 2021-07-20 NOTE — Progress Notes (Signed)
1523 BP 173/123, Labetalol given IV, MD update, vss

## 2021-07-25 ENCOUNTER — Telehealth: Payer: Self-pay

## 2021-07-25 NOTE — Telephone Encounter (Signed)
No answer, left message to call if having any issues or concerns, B.Wash Nienhaus RN 

## 2021-07-27 ENCOUNTER — Telehealth: Payer: Self-pay | Admitting: Internal Medicine

## 2021-07-27 ENCOUNTER — Encounter: Payer: Self-pay | Admitting: Internal Medicine

## 2021-07-27 NOTE — Telephone Encounter (Signed)
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 It had favorable pathologic features. There is was a 0.4 cm margin that was uninvolved by adenocarcinoma. The polyp was removed in one piece. Per NCCN guidelines, this means that the removal of the pedunculated polyp was definitive treatment for the cancer. I told the patient that I would need to bring her back for a flexible sigmoidoscopy to tattoo the polypectomy site. She is agreeable to this.  Ammie, let's schedule a flexible sigmoidoscopy for her at the [facility] at my earliest available appointment for tattooing of the polypectomy site. I'm okay with overbooking if they allow me to. Please place a recall for her to come back for another colonoscopy in 1 year for surveillance.

## 2021-07-27 NOTE — Progress Notes (Signed)
Hi Amy Kim, patient also has H pylori. Please call her to let her know and send her bismuth quadruple therapy for treatment: - Tetracycline 500 mg QID x 14 days - Flagyl 250 mg QID x 14 days - Bismuth subsalicylate 768 mg QID x 14 days - PPI BID x 14 days  About 4 weeks after completion of therapy, patient will need to be checked for eradication with a stool H pylori antigen. PPI therapy will need to be held 2 weeks prior to checking for eradication. Please schedule a GI follow up appointment for her with me in 1 month to confirm eradication and determine if she may need further treatment.

## 2021-07-28 ENCOUNTER — Other Ambulatory Visit: Payer: Self-pay

## 2021-07-28 DIAGNOSIS — A048 Other specified bacterial intestinal infections: Secondary | ICD-10-CM

## 2021-07-28 DIAGNOSIS — C189 Malignant neoplasm of colon, unspecified: Secondary | ICD-10-CM

## 2021-07-28 MED ORDER — METRONIDAZOLE 250 MG PO TABS: 250.0000 mg | ORAL_TABLET | Freq: Four times a day (QID) | ORAL | 0 refills | Status: AC

## 2021-07-28 MED ORDER — TETRACYCLINE HCL 500 MG PO CAPS: 500.0000 mg | ORAL_CAPSULE | Freq: Four times a day (QID) | ORAL | 0 refills | Status: DC

## 2021-07-28 MED ORDER — PANTOPRAZOLE SODIUM 40 MG PO TBEC: 40.0000 mg | DELAYED_RELEASE_TABLET | Freq: Two times a day (BID) | ORAL | 0 refills | Status: DC

## 2021-07-28 MED ORDER — BISMUTH SUBSALICYLATE 262 MG PO CHEW: 524.0000 mg | CHEWABLE_TABLET | Freq: Four times a day (QID) | ORAL | 0 refills | Status: DC

## 2021-07-28 NOTE — Telephone Encounter (Signed)
Called pt to make her aware of Dr. Libby Maw request below. Verbalized acceptance and understanding. Pt has been scheduled for flex sig 08/10/21 @ 1130am, arrive at 1030am. Amb referral placed for auth purposes. Prep instructions sent via My Chart per pt request.

## 2021-07-28 NOTE — Telephone Encounter (Signed)
Colon recall placed per Dr. Libby Maw request.

## 2021-08-03 ENCOUNTER — Encounter: Payer: Self-pay | Admitting: Physician Assistant

## 2021-08-03 ENCOUNTER — Other Ambulatory Visit: Payer: Self-pay

## 2021-08-03 ENCOUNTER — Ambulatory Visit (INDEPENDENT_AMBULATORY_CARE_PROVIDER_SITE_OTHER): Payer: 59 | Admitting: Physician Assistant

## 2021-08-03 DIAGNOSIS — L729 Follicular cyst of the skin and subcutaneous tissue, unspecified: Secondary | ICD-10-CM | POA: Diagnosis not present

## 2021-08-07 ENCOUNTER — Encounter: Payer: Self-pay | Admitting: Physician Assistant

## 2021-08-07 NOTE — Progress Notes (Signed)
   New Patient   Subjective  Amy Kim is a 35 y.o. female who presents for the following: Cyst (Cyst on the back gown refused and patient aware surgical procedure possible and to call and confirm coverage waiver will be signed for surgical procedure ).Cyst on the back gown refused. patient aware surgical procedure possible and to call and confirm coverage waiver will be signed for surgical procedure    The following portions of the chart were reviewed this encounter and updated as appropriate:  Tobacco  Allergies  Meds  Problems  Med Hx  Surg Hx  Fam Hx      Objective  Well appearing patient in no apparent distress; mood and affect are within normal limits.  All skin waist up examined.  upper left  Posterior (Back) Hyperpigmented indentation with palpation. No noticeable cyst.    Assessment & Plan  Cyst of skin upper left  Posterior (Back)  Patient stated it was inflamed in August, she will need a 30 minute surgical visit 1.5 cm current size of the lesion if it recurs. Patient is aware surgical procedure possible and to call and confirm coverage. A waiver will need to be signed for surgical procedure       I, Eddy Termine, PA-C, have reviewed all documentation's for this visit.  The documentation on 08/07/21 for the exam, diagnosis, procedures and orders are all accurate and complete.

## 2021-08-10 ENCOUNTER — Ambulatory Visit (AMBULATORY_SURGERY_CENTER): Payer: 59 | Admitting: Internal Medicine

## 2021-08-10 ENCOUNTER — Encounter: Payer: Self-pay | Admitting: Internal Medicine

## 2021-08-10 ENCOUNTER — Other Ambulatory Visit: Payer: Self-pay

## 2021-08-10 VITALS — BP 148/84 | HR 68 | Temp 98.6°F | Resp 16 | Ht 61.0 in | Wt 199.0 lb

## 2021-08-10 DIAGNOSIS — K5140 Inflammatory polyps of colon without complications: Secondary | ICD-10-CM

## 2021-08-10 DIAGNOSIS — K625 Hemorrhage of anus and rectum: Secondary | ICD-10-CM

## 2021-08-10 DIAGNOSIS — Z86010 Personal history of colonic polyps: Secondary | ICD-10-CM

## 2021-08-10 DIAGNOSIS — C189 Malignant neoplasm of colon, unspecified: Secondary | ICD-10-CM

## 2021-08-10 DIAGNOSIS — D509 Iron deficiency anemia, unspecified: Secondary | ICD-10-CM

## 2021-08-10 DIAGNOSIS — K51411 Inflammatory polyps of colon with rectal bleeding: Secondary | ICD-10-CM

## 2021-08-10 DIAGNOSIS — D125 Benign neoplasm of sigmoid colon: Secondary | ICD-10-CM

## 2021-08-10 MED ORDER — SODIUM CHLORIDE 0.9 % IV SOLN
500.0000 mL | Freq: Once | INTRAVENOUS | Status: DC
Start: 2021-08-10 — End: 2023-04-24

## 2021-08-10 NOTE — Progress Notes (Signed)
Called to room to assist during endoscopic procedure.  Patient ID and intended procedure confirmed with present staff. Received instructions for my participation in the procedure from the performing physician.  

## 2021-08-10 NOTE — Patient Instructions (Signed)
YOU HAD AN ENDOSCOPIC PROCEDURE TODAY AT THE [facility]:   Refer to the procedure report that was given to you for any specific questions about what was found during the examination.  If the procedure report does not answer your questions, please call your gastroenterologist to clarify.  If you requested that your care partner not be given the details of your procedure findings, then the procedure report has been included in a sealed envelope for you to review at your convenience later.  YOU SHOULD EXPECT: Some feelings of bloating in the abdomen. Passage of more gas than usual.  Walking can help get rid of the air that was put into your GI tract during the procedure and reduce the bloating. If you had a lower endoscopy (such as a colonoscopy or flexible sigmoidoscopy) you may notice spotting of blood in your stool or on the toilet paper. If you underwent a bowel prep for your procedure, you may not have a normal bowel movement for a few days.  Please Note:  You might notice some irritation and congestion in your nose or some drainage.  This is from the oxygen used during your procedure.  There is no need for concern and it should clear up in a day or so.  SYMPTOMS TO REPORT IMMEDIATELY:   Following lower endoscopy (colonoscopy or flexible sigmoidoscopy):  Excessive amounts of blood in the stool  Significant tenderness or worsening of abdominal pains  Swelling of the abdomen that is new, acute  Fever of 100F or higher  For urgent or emergent issues, a gastroenterologist can be reached at any hour by calling [phone]. Do not use MyChart messaging for urgent concerns.    DIET:  We do recommend a small meal at first, but then you may proceed to your regular diet.  Drink plenty of fluids but you should avoid alcoholic beverages for 24 hours.  ACTIVITY:  You should plan to take it easy for the rest of today and you should NOT DRIVE or use heavy machinery until tomorrow (because  of the sedation medicines used during the test).    FOLLOW UP: Our staff will call the number listed on your records 48-72 hours following your procedure to check on you and address any questions or concerns that you may have regarding the information given to you following your procedure. If we do not reach you, we will leave a message.  We will attempt to reach you two times.  During this call, we will ask if you have developed any symptoms of COVID 19. If you develop any symptoms (ie: fever, flu-like symptoms, shortness of breath, cough etc.) before then, please call [phone].  If you test positive for Covid 19 in the 2 weeks post procedure, please call and report this information to us.    If any biopsies were taken you will be contacted by phone or by letter within the next 1-3 weeks.  Please call us at [phone] if you have not heard about the biopsies in 3 weeks.    SIGNATURES/CONFIDENTIALITY: You and/or your care partner have signed paperwork which will be entered into your electronic medical record.  These signatures attest to the fact that that the information above on your After Visit Summary has been reviewed and is understood.  Full responsibility of the confidentiality of this discharge information lies with you and/or your care-partner. 

## 2021-08-10 NOTE — Progress Notes (Signed)
GASTROENTEROLOGY PROCEDURE H&P NOTE   Primary Care Physician: Camillia Herter, NP    Reason for Procedure:   Tattooing of colon cancer removal  Plan:    Flexible sigmoidoscopy  Patient is appropriate for endoscopic procedure(s) in the ambulatory ([place]) setting.  The nature of the procedure, as well as the risks, benefits, and alternatives were carefully and thoroughly reviewed with the patient. Ample time for discussion and questions allowed. The patient understood, was satisfied, and agreed to proceed.     HPI: Amy Kim is a 35 y.o. female who presents for flexible sigmoidoscopy for tattooing of colon cancer removal site.  Past Medical History:  Diagnosis Date   Back pain    DDD   Diabetes ([place])    History of migraine    last one 2 days ago   IDA (iron deficiency anemia)    Pregnancy induced hypertension    Urinary frequency     Past Surgical History:  Procedure Laterality Date   CESAREAN SECTION     x 4 total   CESAREAN SECTION N/A 08/11/2015   Procedure: REPEAT CESAREAN SECTION;  Surgeon: Truett Mainland, DO;  Location: [facility];  Service: Obstetrics;  Laterality: N/A;   CESAREAN SECTION N/A 09/19/2018   Procedure: CESAREAN SECTION;  Surgeon: Osborne Oman, MD;  Location: [facility];  Service: Obstetrics;  Laterality: N/A;   CHOLECYSTECTOMY N/A 03/21/2016   Procedure: LAPAROSCOPIC CHOLECYSTECTOMY;  Surgeon: Ralene Ok, MD;  Location: [facility];  Service: General;  Laterality: N/A;    Prior to Admission medications   Medication Sig Start Date End Date Taking? Authorizing Provider  amLODipine (NORVASC) 5 MG tablet Take 1 tablet (5 mg total) by mouth daily. 06/28/21  Yes Minette Brine, Amy J, NP  medroxyPROGESTERone Acetate 150 MG/ML SUSY Inject 150 mg as directed PRO. Every 90 days 05/13/21  Yes [provider]  metFORMIN (GLUCOPHAGE) 500 MG tablet Take 1 tablet (500 mg total) by mouth 2 (two) times daily with a meal. 06/28/21  Yes Minette Brine,  Amy J, NP  pantoprazole (PROTONIX) 40 MG tablet Take 1 tablet (40 mg total) by mouth 2 (two) times daily for 14 days. 07/28/21 08/11/21 Yes Sharyn Creamer, MD  metroNIDAZOLE (FLAGYL) 250 MG tablet Take 1 tablet (250 mg total) by mouth 4 (four) times daily for 14 days. Patient not taking: Reported on 08/03/2021 07/28/21 08/11/21  Sharyn Creamer, MD    Current Outpatient Medications  Medication Sig Dispense Refill   amLODipine (NORVASC) 5 MG tablet Take 1 tablet (5 mg total) by mouth daily. 90 tablet 0   medroxyPROGESTERone Acetate 150 MG/ML SUSY Inject 150 mg as directed PRO. Every 90 days     metFORMIN (GLUCOPHAGE) 500 MG tablet Take 1 tablet (500 mg total) by mouth 2 (two) times daily with a meal. 180 tablet 0   pantoprazole (PROTONIX) 40 MG tablet Take 1 tablet (40 mg total) by mouth 2 (two) times daily for 14 days. 28 tablet 0   metroNIDAZOLE (FLAGYL) 250 MG tablet Take 1 tablet (250 mg total) by mouth 4 (four) times daily for 14 days. (Patient not taking: Reported on 08/03/2021) 56 tablet 0   Current Facility-Administered Medications  Medication Dose Route Frequency Provider Last Rate Last Admin   0.9 %  sodium chloride infusion  500 mL Intravenous Once Sharyn Creamer, MD        Allergies as of 08/10/2021   (No Known Allergies)    Family History  Problem Relation Age of  Onset   Hypertension Mother    Hypertension Father    Heart disease Paternal Grandmother    Colon cancer Neg Hx    Esophageal cancer Neg Hx    Pancreatic cancer Neg Hx    Stomach cancer Neg Hx    Liver disease Neg Hx     Social History   Socioeconomic History   Marital status: Married    Spouse name: Not on file   Number of children: Not on file   Years of education: Not on file   Highest education level: Not on file  Occupational History   Not on file  Tobacco Use   Smoking status: Never   Smokeless tobacco: Never  Vaping Use   Vaping Use: Never used  Substance and Sexual Activity   Alcohol use:  Not Currently    Comment: occaionally   Drug use: No   Sexual activity: Yes    Birth control/protection: Injection    Comment: Medroxyprogesterone   Other Topics Concern   Not on file  Social History Narrative   Not on file   Social Determinants of Health   Financial Resource Strain: Not on file  Food Insecurity: Not on file  Transportation Needs: Not on file  Physical Activity: Not on file  Stress: Not on file  Social Connections: Not on file  Intimate Partner Violence: Not on file    Physical Exam: Vital signs in last 24 hours: BP 110/75   Pulse 78   Temp 98.6 F (37 C)   Resp (!) 25   Ht 5\' 1"  (1.549 m)   Wt 199 lb (90.3 kg)   SpO2 100%   BMI 37.60 kg/m  GEN: NAD EYE: Sclerae anicteric ENT: MMM CV: Non-tachycardic Pulm: No increased work of breathing GI: Soft, NT/ND NEURO:  Alert & Oriented   [name], MD [facility]  08/10/2021 11:57 AM

## 2021-08-10 NOTE — Op Note (Signed)
[place] Patient Name: Amy Kim Procedure Date: 08/10/2021 11:28 AM MRN: [id number] Endoscopist: Sonny Masters "[name] ,  Age: 35 Referring MD:  Date of Birth: 09/27/1985 Gender: Female Account #: [id number] Procedure:                Flexible Sigmoidoscopy Indications:              Personal history of malignant neoplasm of the colon Medicines:                Monitored Anesthesia Care Procedure:                Pre-Anesthesia Assessment:                           - Prior to the procedure, a History and Physical                            was performed, and patient medications and                            allergies were reviewed. The patient's tolerance of                            previous anesthesia was also reviewed. The risks                            and benefits of the procedure and the sedation                            options and risks were discussed with the patient.                            All questions were answered, and informed consent                            was obtained. Prior Anticoagulants: The patient has                            taken no previous anticoagulant or antiplatelet                            agents. ASA Grade Assessment: II - A patient with                            mild systemic disease. After reviewing the risks                            and benefits, the patient was deemed in                            satisfactory condition to undergo the procedure.                           After obtaining informed consent, the scope was  passed under direct vision. The GIF [id number]                            was introduced through the anus and advanced to the                            the sigmoid colon. Scope In: 11:43:04 AM Scope Out: 11:54:28 AM Total Procedure Duration: 0 hours 11 minutes 24 seconds  Findings:                 A polyp at the site of prior colon resection was                             found in the sigmoid colon. The appearance of the                            polyp is likely consistent with granulation tissue,                            but biopsies were taken with a cold forceps for                            histology to rule out residual adenocarcinoma. Area                            was tattooed with an injection of 0.3 mL of Spot                            (carbon black).                           A foreign body (Endoclip) was found in the sigmoid                            colon.                           Non-bleeding internal hemorrhoids were found during                            retroflexion. Complications:            No immediate complications. Estimated Blood Loss:     Estimated blood loss was minimal. Impression:               - One polyp in the sigmoid colon. Biopsied.                            Tattooed.                           - Foreign body (Endoclip) in the sigmoid colon.                           - Non-bleeding internal hemorrhoids. Recommendation:           -  Discharge patient to home (with escort).                           - Await pathology results.                           - The findings and recommendations were discussed                            with the patient. Sonny Masters "[name],  08/10/2021 12:02:44 PM

## 2021-08-10 NOTE — Progress Notes (Signed)
Report to PACU, RN, vss, BBS= Clear.  

## 2021-08-10 NOTE — Progress Notes (Signed)
Pt's states no medical or surgical changes since previsit or office visit. VS by CW. 

## 2021-08-14 ENCOUNTER — Telehealth: Payer: Self-pay | Admitting: *Deleted

## 2021-08-14 NOTE — Telephone Encounter (Signed)
  Follow up Call-  Call back number 08/10/2021 07/20/2021  Post procedure Call Back phone  # [phone]  Permission to leave phone message Yes Yes  Some recent data might be hidden     Patient questions:  Do you have a fever, pain , or abdominal swelling? No. Pain Score  0 *  Have you tolerated food without any problems? Yes.    Have you been able to return to your normal activities? Yes.    Do you have any questions about your discharge instructions: Diet   No. Medications  No. Follow up visit  No.  Do you have questions or concerns about your Care? No.  Actions: * If pain score is 4 or above: No action needed, pain <4.  Have you developed a fever since your procedure? no  2.   Have you had an respiratory symptoms (SOB or cough) since your procedure? no  3.   Have you tested positive for COVID 19 since your procedure no  4.   Have you had any family members/close contacts diagnosed with the COVID 19 since your procedure?  no   If yes to any of these questions please route to Joylene John, RN and Joella Prince, RN

## 2021-08-15 ENCOUNTER — Encounter: Payer: Self-pay | Admitting: Internal Medicine

## 2021-09-01 ENCOUNTER — Ambulatory Visit (INDEPENDENT_AMBULATORY_CARE_PROVIDER_SITE_OTHER): Payer: 59

## 2021-09-01 ENCOUNTER — Other Ambulatory Visit: Payer: Self-pay

## 2021-09-01 DIAGNOSIS — Z3042 Encounter for surveillance of injectable contraceptive: Secondary | ICD-10-CM | POA: Diagnosis not present

## 2021-09-01 MED ORDER — MEDROXYPROGESTERONE ACETATE 150 MG/ML IM SUSP
150.0000 mg | Freq: Once | INTRAMUSCULAR | Status: AC
Start: 2021-09-01 — End: 2021-09-01
  Administered 2021-09-01: 150 mg via INTRAMUSCULAR

## 2021-09-01 NOTE — Progress Notes (Deleted)
Patient came in for depo shot. Patient tolerated it well.

## 2021-09-07 ENCOUNTER — Other Ambulatory Visit: Payer: Self-pay

## 2021-09-07 ENCOUNTER — Emergency Department (HOSPITAL_BASED_OUTPATIENT_CLINIC_OR_DEPARTMENT_OTHER)
Admission: EM | Admit: 2021-09-07 | Discharge: 2021-09-07 | Disposition: A | Discharge status: Home / Self Care | Payer: 59 | Attending: Emergency Medicine | Admitting: Emergency Medicine

## 2021-09-07 ENCOUNTER — Emergency Department (HOSPITAL_BASED_OUTPATIENT_CLINIC_OR_DEPARTMENT_OTHER): Payer: 59 | Admitting: Radiology

## 2021-09-07 ENCOUNTER — Encounter (HOSPITAL_BASED_OUTPATIENT_CLINIC_OR_DEPARTMENT_OTHER): Payer: Self-pay | Admitting: Emergency Medicine

## 2021-09-07 DIAGNOSIS — M545 Low back pain, unspecified: Secondary | ICD-10-CM | POA: Diagnosis present

## 2021-09-07 DIAGNOSIS — I10 Essential (primary) hypertension: Secondary | ICD-10-CM | POA: Diagnosis not present

## 2021-09-07 DIAGNOSIS — Z7984 Long term (current) use of oral hypoglycemic drugs: Secondary | ICD-10-CM | POA: Insufficient documentation

## 2021-09-07 DIAGNOSIS — Y92410 Unspecified street and highway as the place of occurrence of the external cause: Secondary | ICD-10-CM | POA: Diagnosis not present

## 2021-09-07 DIAGNOSIS — Z79899 Other long term (current) drug therapy: Secondary | ICD-10-CM | POA: Insufficient documentation

## 2021-09-07 DIAGNOSIS — G8911 Acute pain due to trauma: Secondary | ICD-10-CM | POA: Insufficient documentation

## 2021-09-07 DIAGNOSIS — E119 Type 2 diabetes mellitus without complications: Secondary | ICD-10-CM | POA: Diagnosis not present

## 2021-09-07 MED ORDER — LIDOCAINE 5 % EX PTCH
1.0000 | MEDICATED_PATCH | CUTANEOUS | Status: DC
  Administered 2021-09-07: 1 via TRANSDERMAL
  Filled 2021-09-07: qty 1

## 2021-09-07 MED ORDER — ACETAMINOPHEN 325 MG PO TABS: 650.0000 mg | ORAL_TABLET | Freq: Four times a day (QID) | ORAL | 0 refills | Status: DC | PRN

## 2021-09-07 MED ORDER — METHOCARBAMOL 500 MG PO TABS: 1000.0000 mg | ORAL_TABLET | Freq: Two times a day (BID) | ORAL | 0 refills | Status: AC

## 2021-09-07 MED ORDER — HYDROCODONE-ACETAMINOPHEN 5-325 MG PO TABS
1.0000 | ORAL_TABLET | Freq: Once | ORAL | Status: AC
  Administered 2021-09-07: 1 via ORAL
  Filled 2021-09-07: qty 1

## 2021-09-07 MED ORDER — LIDO KING 4 % EX PTCH
1.0000 | MEDICATED_PATCH | Freq: Every day | CUTANEOUS | 0 refills | Status: DC | PRN
Start: 2021-09-07 — End: 2021-10-06
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
## 2021-09-14 ENCOUNTER — Other Ambulatory Visit: Payer: Self-pay | Admitting: Family

## 2021-09-14 ENCOUNTER — Ambulatory Visit: Payer: 59 | Admitting: Internal Medicine

## 2021-09-14 DIAGNOSIS — M545 Low back pain, unspecified: Secondary | ICD-10-CM
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
## 2021-09-20 NOTE — Progress Notes (Signed)
Patient came in for depo shot. Patient tolerated it well.

## 2021-09-20 NOTE — Patient Instructions (Signed)
Patient came in for depo shot. Patient tolerated it well.

## 2021-09-26 ENCOUNTER — Encounter: Payer: Self-pay | Admitting: Family

## 2021-09-26 ENCOUNTER — Other Ambulatory Visit: Payer: Self-pay

## 2021-09-26 ENCOUNTER — Ambulatory Visit: Payer: 59 | Admitting: Family

## 2021-09-26 ENCOUNTER — Ambulatory Visit (INDEPENDENT_AMBULATORY_CARE_PROVIDER_SITE_OTHER): Payer: Self-pay | Admitting: Family

## 2021-09-26 VITALS — BP 108/70 | HR 91 | Temp 98.3°F | Resp 18 | Ht 60.98 in | Wt 190.0 lb

## 2021-09-26 DIAGNOSIS — I10 Essential (primary) hypertension: Secondary | ICD-10-CM

## 2021-09-26 DIAGNOSIS — E119 Type 2 diabetes mellitus without complications: Secondary | ICD-10-CM

## 2021-09-26 DIAGNOSIS — M545 Low back pain, unspecified: Secondary | ICD-10-CM

## 2021-09-26 DIAGNOSIS — N926 Irregular menstruation, unspecified: Secondary | ICD-10-CM

## 2021-09-26 MED ORDER — AMLODIPINE BESYLATE 5 MG PO TABS: 5.0000 mg | ORAL_TABLET | Freq: Every day | ORAL | 0 refills | Status: DC
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
## 2021-09-26 NOTE — Patient Instructions (Signed)
Metformin Tablets What is this medication? METFORMIN (met FOR min) treats type 2 diabetes. It controls blood sugar (glucose) and helps your body use insulin effectively. This medication is often combined with changes to diet and exercise. This medicine may be used for other purposes; ask your health care provider or pharmacist if you have questions. COMMON BRAND NAME(S): Glucophage What should I tell my care team before I take this medication? They need to know if you have any of these conditions: Anemia Dehydration Heart disease If you often drink alcohol Kidney disease Liver disease Polycystic ovary syndrome Serious infection or injury Vomiting An unusual or allergic reaction to metformin, other medications, foods, dyes, or preservatives Pregnant or trying to get pregnant Breast-feeding How should I use this medication? Take this medication by mouth with a glass of water. Follow the directions on the prescription label. Take this medication with food. Take your medication at regular intervals. Do not take your medication more often than directed. Do not stop taking except on your care team's advice. Talk to your care team about the use of this medication in children. While this medication may be prescribed for children as young as 48 years of age for selected conditions, precautions do apply. Overdosage: If you think you have taken too much of this medicine contact a poison control center or emergency room at once. NOTE: This medicine is only for you. Do not share this medicine with others. What if I miss a dose? If you miss a dose, take it as soon as you can. If it is almost time for your next dose, take only that dose. Do not take double or extra doses. What may interact with this medication? Do not take this medication with any of the following: Certain contrast medications given before X-rays, CT scans, MRI, or other procedures Dofetilide This medication may also interact with the  following: Acetazolamide Alcohol Certain antivirals for HIV or hepatitis Certain medications for blood pressure, heart disease, irregular heart beat Cimetidine Dichlorphenamide Digoxin Diuretics Estrogens, progestins, or birth control pills Glycopyrrolate Isoniazid Lamotrigine Memantine Methazolamide Metoclopramide Midodrine Niacin Phenothiazines like chlorpromazine, mesoridazine, prochlorperazine, thioridazine Phenytoin Ranolazine Steroid medications like prednisone or cortisone Stimulant medications for attention disorders, weight loss, or to stay awake Thyroid medications Topiramate Trospium Vandetanib Zonisamide This list may not describe all possible interactions. Give your health care provider a list of all the medicines, herbs, non-prescription drugs, or dietary supplements you use. Also tell them if you smoke, drink alcohol, or use illegal drugs. Some items may interact with your medicine. What should I watch for while using this medication? Visit your care team for regular checks on your progress. A test called the HbA1C (A1C) will be monitored. This is a simple blood test. It measures your blood sugar control over the last 2 to 3 months. You will receive this test every 3 to 6 months. Using this medication with insulin or a sulfonylurea may increase your risk of hypoglycemia. Learn how to check your blood sugar. Learn the symptoms of low and high blood sugar and how to manage them. Always carry a quick-source of sugar with you in case you have symptoms of low blood sugar. Examples include hard sugar candy or glucose tablets. Make sure others know that you can choke if you eat or drink when you develop serious symptoms of low blood sugar, such as seizures or unconsciousness. They must get medical help at once. Tell your care team if you have high blood sugar. You might need  to change the dose of your medication. If you are sick or exercising more than usual, you might need  to change the dose of your medication. Do not skip meals. Ask your care team if you should avoid alcohol. Many nonprescription cough and cold products contain sugar or alcohol. These can affect blood sugar. This medication may cause ovulation in premenopausal women who do not have regular monthly periods. This may increase your chances of becoming pregnant. You should not take this medication if you become pregnant or think you may be pregnant. Talk with your care team about your birth control options while taking this medication. Contact your care team right away if you think you are pregnant. If you are going to need surgery, an MRI, CT scan, or other procedure, tell your care team that you are taking this medication. You may need to stop taking this medication before the procedure. Wear a medical ID bracelet or chain, and carry a card that describes your disease and details of your medication and dosage times. This medication may cause a decrease in folic acid and vitamin B12. You should make sure that you get enough vitamins while you are taking this medication. Discuss the foods you eat and the vitamins you take with your care team. What side effects may I notice from receiving this medication? Side effects that you should report to your care team as soon as possible: Allergic reactions--skin rash, itching, hives, swelling of the face, lips, tongue, or throat High lactic acid level--muscle pain or cramps, stomach pain, trouble breathing, general discomfort or fatigue Low vitamin B12 level--pain, tingling, or numbness in the hands or feet, muscle weakness, dizziness, confusion, difficulty concentrating Side effects that usually do not require medical attention (report to your care team if they continue or are bothersome): Diarrhea Gas Headache Metallic taste in mouth Nausea This list may not describe all possible side effects. Call your doctor for medical advice about side effects. You may  report side effects to FDA at 1-800-FDA-1088. Where should I keep my medication? Keep out of the reach of children and pets. Store at room temperature between 15 and 30 degrees C (59 and 86 degrees F). Protect from moisture and light. Get rid of any unused medication after the expiration date. To get rid of medications that are no longer needed or expired: Take the medication to a medication take-back program. Check with your pharmacy or law enforcement to find a location. If you cannot return the medication, check the label or package insert to see if the medication should be thrown out in the garbage or flushed down the toilet. If you are not sure, ask your care team. If it is safe to put in the trash, empty the medication out of the container. Mix the medication with cat litter, dirt, coffee grounds, or other unwanted substance. Seal the mixture in a bag or container. Put it in the trash. NOTE: This sheet is a summary. It may not cover all possible information. If you have questions about this medicine, talk to your doctor, pharmacist, or health care provider.  2022 Elsevier/Gold Standard (2020-08-26 00:00:00)

## 2021-09-27 ENCOUNTER — Other Ambulatory Visit: Payer: Self-pay | Admitting: Family

## 2021-09-27 DIAGNOSIS — E119 Type 2 diabetes mellitus without complications: Secondary | ICD-10-CM

## 2021-09-27 LAB — BASIC METABOLIC PANEL
BUN/Creatinine Ratio: 30 — ABNORMAL HIGH (ref 9–23)
BUN: 20 mg/dL (ref 6–20)
CO2: 19 mmol/L — ABNORMAL LOW (ref 20–29)
Calcium: 9.6 mg/dL (ref 8.7–10.2)
Chloride: 101 mmol/L (ref 96–106)
Creatinine, Ser: 0.66 mg/dL (ref 0.57–1.00)
Glucose: 75 mg/dL (ref 70–99)
Potassium: 4.2 mmol/L (ref 3.5–5.2)
Sodium: 140 mmol/L (ref 134–144)
eGFR: 118 mL/min/{1.73_m2} (ref >59)

## 2021-09-27 LAB — HEMOGLOBIN A1C
Est. average glucose Bld gHb Est-mCnc: 143 mg/dL
Hgb A1c MFr Bld: 6.6 % — ABNORMAL HIGH (ref 4.8–5.6)

## 2021-09-27 MED ORDER — METFORMIN HCL 500 MG PO TABS: 500.0000 mg | ORAL_TABLET | Freq: Two times a day (BID) | ORAL | 0 refills | Status: DC

## 2021-09-27 NOTE — Progress Notes (Signed)
Kidney function normal.   Diabetes at goal. Continue Metformin. Please call our office to schedule follow-up in 4 months.

## 2021-10-04 ENCOUNTER — Other Ambulatory Visit: Payer: Self-pay

## 2021-10-04 ENCOUNTER — Ambulatory Visit (INDEPENDENT_AMBULATORY_CARE_PROVIDER_SITE_OTHER): Payer: Self-pay | Admitting: Surgery

## 2021-10-04 ENCOUNTER — Encounter: Payer: Self-pay | Admitting: Surgery

## 2021-10-04 VITALS — BP 136/86 | HR 81 | Ht 61.0 in | Wt 196.2 lb

## 2021-10-04 DIAGNOSIS — M5416 Radiculopathy, lumbar region: Secondary | ICD-10-CM
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
## 2021-10-06 ENCOUNTER — Ambulatory Visit (INDEPENDENT_AMBULATORY_CARE_PROVIDER_SITE_OTHER): Payer: Self-pay | Admitting: Internal Medicine

## 2021-10-06 ENCOUNTER — Other Ambulatory Visit (INDEPENDENT_AMBULATORY_CARE_PROVIDER_SITE_OTHER): Payer: Self-pay

## 2021-10-06 ENCOUNTER — Encounter: Payer: Self-pay | Admitting: Internal Medicine

## 2021-10-06 VITALS — BP 140/84 | HR 77 | Ht 61.0 in | Wt 198.0 lb

## 2021-10-06 DIAGNOSIS — A048 Other specified bacterial intestinal infections: Secondary | ICD-10-CM

## 2021-10-06 DIAGNOSIS — C189 Malignant neoplasm of colon, unspecified: Secondary | ICD-10-CM

## 2021-10-06 LAB — CBC WITH DIFFERENTIAL/PLATELET
Basophils Absolute: 0 10*3/uL (ref 0.0–0.1)
Basophils Relative: 0.5 % (ref 0.0–3.0)
Eosinophils Absolute: 0.1 10*3/uL (ref 0.0–0.7)
Eosinophils Relative: 1 % (ref 0.0–5.0)
HCT: 38.7 % (ref 36.0–46.0)
Hemoglobin: 12.4 g/dL (ref 12.0–15.0)
Lymphocytes Relative: 29.1 % (ref 12.0–46.0)
Lymphs Abs: 2.5 10*3/uL (ref 0.7–4.0)
MCHC: 32.1 g/dL (ref 30.0–36.0)
MCV: 81.5 fl (ref 78.0–100.0)
Monocytes Absolute: 0.4 10*3/uL (ref 0.1–1.0)
Monocytes Relative: 5.1 % (ref 3.0–12.0)
Neutro Abs: 5.5 10*3/uL (ref 1.4–7.7)
Neutrophils Relative %: 64.3 % (ref 43.0–77.0)
Platelets: 489 10*3/uL — ABNORMAL HIGH (ref 150.0–400.0)
RBC: 4.74 Mil/uL (ref 3.87–5.11)
RDW: 14.4 % (ref 11.5–15.5)
WBC: 8.5 10*3/uL (ref 4.0–10.5)

## 2021-10-06 LAB — IBC + FERRITIN
Ferritin: 32 ng/mL (ref 10.0–291.0)
Iron: 49 ug/dL (ref 42–145)
Saturation Ratios: 13.9 % — ABNORMAL LOW (ref 20.0–50.0)
TIBC: 352.8 ug/dL (ref 250.0–450.0)
Transferrin: 252 mg/dL (ref 212.0–360.0)

## 2021-10-06 NOTE — Patient Instructions (Signed)
If you are age 35 or older, your body mass index should be between 23-30. Your Body mass index is 37.41 kg/m. If this is out of the aforementioned range listed, please consider follow up with your Primary Care Provider.  If you are age 72 or younger, your body mass index should be between 19-25. Your Body mass index is 37.41 kg/m. If this is out of the aformentioned range listed, please consider follow up with your Primary Care Provider.   ________________________________________________________  The [facility] providers would like to encourage you to use [facility] to communicate with providers for non-urgent requests or questions.  Due to long hold times on the telephone, sending your provider a message by [facility] may be a faster and more efficient way to get a response.  Please allow 48 business hours for a response.  Please remember that this is for non-urgent requests.  _______________________________________________________  Your provider has requested that you go to the basement level for lab work before leaving today. Press "B" on the elevator. The lab is located at the first door on the left as you exit the elevator.  You have a recall in for a colonoscopy for October, 2023

## 2021-10-06 NOTE — Progress Notes (Signed)
Chief Complaint: IDA  HPI :  36 year old female with history of DM, migraines, IDA presents with IDA  Interval History: Patient states that she has been doing well.  She has over the last 1 to 2 months or noticed some increased vaginal bleeding which she has mentioned to her PCP.  Denies blood in stools, abdominal pain, nausea, vomiting.  She has been able to tolerate p.o. intake without issues.  She completed her course of treatment for H. pylori infection.  Has not dropped off a stool sample to confirm eradication of H. pylori infection.  Denies weight loss  Current Outpatient Medications  Medication Sig Dispense Refill   acetaminophen (TYLENOL) 325 MG tablet Take 2 tablets (650 mg total) by mouth every 6 (six) hours as needed. 36 tablet 0   amLODipine (NORVASC) 5 MG tablet Take 1 tablet (5 mg total) by mouth daily. 90 tablet 0   medroxyPROGESTERone Acetate 150 MG/ML SUSY Inject 150 mg as directed PRO. Every 90 days     metFORMIN (GLUCOPHAGE) 500 MG tablet Take 1 tablet (500 mg total) by mouth 2 (two) times daily with a meal. 240 tablet 0   Current Facility-Administered Medications  Medication Dose Route Frequency Provider Last Rate Last Admin   0.9 %  sodium chloride infusion  500 mL Intravenous Once Sharyn Creamer, MD       Review of Systems: All systems reviewed and negative except where noted in HPI.   Physical Exam: BP 140/84    Pulse 77    Ht 5\' 1"  (1.549 m)    Wt 198 lb (89.8 kg)    BMI 37.41 kg/m  Constitutional: Pleasant,well-developed, female in no acute distress. HEENT: Normocephalic and atraumatic. Conjunctivae are normal. No scleral icterus. Cardiovascular: Normal rate, regular rhythm.  Pulmonary/chest: Effort normal and breath sounds normal. No wheezing, rales or rhonchi. Abdominal: Soft, nondistended, nontender. Bowel sounds active throughout. There are no masses palpable. No hepatomegaly. Extremities: No edema Neurological: Alert and oriented to person place and  time. Skin: Skin is warm and dry. No rashes noted. Psychiatric: Normal mood and affect. Behavior is normal.  Labs 05/2021: Hb 11.7 (L), ferritin 38, iron sat 18%  EGD 07/20/21: - Normal esophagus. - Erosive gastropathy with stigmata of recent bleeding. Biopsied. - Normal examined duodenum. Biopsied. Path: 1. Surgical [P], duodenal biopsies - BENIGN DUODENAL MUCOSA - NO ACUTE INFLAMMATION, VILLOUS BLUNTING OR INCREASED INTRAEPITHELIAL LYMPHOCYTES IDENTIFIED 2. Surgical [P], gastric biopsies - CHRONIC ACTIVE GASTRITIS - H. PYLORI ORGANISMS PRESENT - NO INTESTINAL METAPLASIA IDENTIFIED - SEE COMMENT Path: 2. Warthin-Starry stain is POSITIVE for organisms consistent with Helicobacter pylori.  Colonoscopy 07/20/21: - The examined portion of the ileum was normal. - Diverticulosis in the entire examined colon. - One 30 mm polyp in the sigmoid colon at 20 cm proximal to the anus, removed with mucosal resection. Clip was placed and epi injection was performed prior to resection. Resected and retrieved. - One 6 mm polyp in the rectum, removed with a cold snare. Resected and retrieved. - Non-bleeding internal hemorrhoids. - Mucosal resection was performed. Resection and retrieval were complete. Path: 3. Surgical [P], colon, sigmoid, polyp (1) - INVASIVE MODERATELY DIFFERENTIATED ADENOCARCINOMA ARISING IN A TUBULAR ADENOMA WITH HIGH-GRADE DYSPLASIA - MARGIN UNINVOLVED BY ADENOCARCINOMA (0.4 CM) - SEE COMMENT 4. Surgical [P], colon, rectum, polyp (1) - TUBULAR ADENOMA (3 OF 3 FRAGMENTS) - NO HIGH-GRADE DYSPLASIA OR MALIGNANCY IDENTIFIED Path: The carcinoma invades into the submucosa for a depth of 1 cm. No  lymphovascular space invasion, poor differentiation or high-grade tumor budding is identified. Dr. Vic Ripper reviewed the case and agrees with the above diagnosis.  ASSESSMENT AND PLAN: H pylori infection Colon cancer Iron deficiency anemia Patient presents for follow-up due to H.  pylori infection and colon cancer that was resected endoscopically in 06/2021.  Patient has been doing well except that she has been experiencing some issues with vaginal bleeding.  I will go ahead and check her blood counts today and encouraged her to make an appointment with her gynecologist for further evaluation of her vaginal bleeding issues.  She will plan to drop off H. pylori stool sample to confirm eradication.  She is due for a repeat colonoscopy for polyp surveillance in 06/2022. - Check CBC, ferritin, iron/TIBC - H pylori stool antigen test - Patient will make an appointment with gynecologist - Colonoscopy recall for 06/2022  [name], MD

## 2021-10-09 LAB — HELICOBACTER PYLORI  SPECIAL ANTIGEN
MICRO NUMBER:: 12868546
RESULT:: DETECTED — AB
SPECIMEN QUALITY: ADEQUATE

## 2021-10-10 ENCOUNTER — Ambulatory Visit: Payer: Self-pay | Attending: Family

## 2021-10-10 ENCOUNTER — Other Ambulatory Visit: Payer: Self-pay

## 2021-10-10 DIAGNOSIS — M5386 Other specified dorsopathies, lumbar region: Secondary | ICD-10-CM | POA: Insufficient documentation

## 2021-10-10 DIAGNOSIS — M5441 Lumbago with sciatica, right side: Secondary | ICD-10-CM | POA: Insufficient documentation

## 2021-10-10 DIAGNOSIS — M545 Low back pain, unspecified: Secondary | ICD-10-CM | POA: Insufficient documentation
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
## 2021-10-11 ENCOUNTER — Other Ambulatory Visit: Payer: Self-pay

## 2021-10-11 DIAGNOSIS — A048 Other specified bacterial intestinal infections: Secondary | ICD-10-CM

## 2021-10-11 MED ORDER — TALICIA 250-12.5-10 MG PO CPDR: DELAYED_RELEASE_CAPSULE | ORAL | 0 refills | Status: DC

## 2021-10-16 ENCOUNTER — Telehealth: Payer: Self-pay | Admitting: Internal Medicine

## 2021-10-16 NOTE — Telephone Encounter (Signed)
Yes. Please let me know when they arrive and I will give her a call.

## 2021-10-16 NOTE — Telephone Encounter (Signed)
Is this the one we were getting a sample for?

## 2021-10-16 NOTE — Telephone Encounter (Signed)
Inbound call from patient stating Amy Kim is too expensive and she cannot afford it.  Wants to know if we have any samples.  Please advise.

## 2021-10-17 ENCOUNTER — Ambulatory Visit: Payer: 59

## 2021-10-17 ENCOUNTER — Other Ambulatory Visit: Payer: Self-pay

## 2021-10-17 DIAGNOSIS — M5441 Lumbago with sciatica, right side: Secondary | ICD-10-CM

## 2021-10-17 DIAGNOSIS — M5386 Other specified dorsopathies, lumbar region: Secondary | ICD-10-CM

## 2021-10-17 NOTE — Therapy (Signed)
OUTPATIENT PHYSICAL THERAPY TREATMENT NOTE   Patient Name: Amy Kim MRN: [id number] DOB:Jun 23, 1986, 36 y.o., female Today's Date: 10/17/2021  PCP: Camillia Herter, NP REFERRING PROVIDER: Camillia Herter, NP    Past Medical History:  Diagnosis Date   Back pain    DDD   Diabetes [facility])    History of migraine    last one 2 days ago   IDA (iron deficiency anemia)    Pregnancy induced hypertension    Urinary frequency    Past Surgical History:  Procedure Laterality Date   CESAREAN SECTION     x 4 total   CESAREAN SECTION N/A 08/11/2015   Procedure: REPEAT CESAREAN SECTION;  Surgeon: Truett Mainland, DO;  Location: [facility];  Service: Obstetrics;  Laterality: N/A;   CESAREAN SECTION N/A 09/19/2018   Procedure: CESAREAN SECTION;  Surgeon: Osborne Oman, MD;  Location: [place];  Service: Obstetrics;  Laterality: N/A;   CHOLECYSTECTOMY N/A 03/21/2016   Procedure: LAPAROSCOPIC CHOLECYSTECTOMY;  Surgeon: Ralene Ok, MD;  Location: [place];  Service: General;  Laterality: N/A;   Patient Active Problem List   Diagnosis Date Noted   Iron deficiency anemia due to chronic blood loss 05/30/2021   Class II obesity 08/26/2020   New onset type 2 diabetes mellitus ([place]) 01/13/2020   Thrombocytosis 01/13/2020   Essential hypertension 12/17/2019   Refusal of blood transfusions as patient is Jehovah's Witness 09/18/2018   Abdominal cyst 03/29/2015   History of C-section 03/24/2015    REFERRING DIAG: Acute low back pain, unspecified back pain laterality, unspecified whether sciatica present  THERAPY DIAG:  No diagnosis found.  PERTINENT HISTORY: Diabetes, High BMI  PRECAUTIONS: None  SUBJECTIVE: No change yet, overdid it cleaning her home, low back is sore  PAIN:  Are you having pain? Yes NPRS scale: 7/10 Pain location: low back Pain orientation: Right  PAIN TYPE: aching and throbbing Pain description: intermittent  Aggravating factors: cleaning,  bending Relieving factors: rest       OBJECTIVE:    DIAGNOSTIC FINDINGS:    Xray  Lumbar 09/07/21: FINDINGS: There is no evidence of lumbar spine fracture. Alignment is normal. Intervertebral disc spaces are maintained.   IMPRESSION: Negative.     SCREENING FOR RED FLAGS: Bowel or bladder incontinence: No Cauda equina syndrome: No   COGNITION:          Overall cognitive status: Within functional limits for tasks assessed                        SENSATION:          Light touch: Appears intact             MUSCLE LENGTH: Hamstrings: Right WNLs deg, SLR neg with min increase in pain; Left WNLs deg Marcello Moores test: Right TBA deg; Left TBA deg   POSTURE:  FH c CT step off, rounded shoulders, increased lordosis   PALPATION: TTP with min. Pressure to the R paraspinals and upper gluteal region   LUMBAR AROM   AROM A/PROM  10/10/2021  Flexion Mod limited, 16" from floor. Provocation of R LBP  Extension Min limitated. Provocation of R LBP  Right lateral flexion Mod limited, 18" from from floor. Provocation of R LBP  Left lateral flexion Mod limited, 18" from floor. Provocation of R LBP  Right rotation Full. Provocation of R LBP  Left rotation Full. No povocation of pain   (Blank rows = not tested) Back ext and  R latve flex increased pt's pain the most.   LE MMT: LE Myotomal screen negative   MMT Right 10/10/2021 Left 10/10/2021  Hip flexion      Hip extension      Hip abduction      Hip adduction      Hip internal rotation      Hip external rotation      Knee flexion      Knee extension      Ankle dorsiflexion      Ankle plantarflexion      Ankle inversion      Ankle eversion       (Blank rows = not tested)   LUMBAR SPECIAL TESTS:  Slump test: Negative   FUNCTIONAL TESTS:  Complete 5xSTS and 2MWT next session   GAIT: Distance walked: Within clinic Assistive device utilized: None Level of assistance: Complete Independence Comments: Gait pattern WNls        TODAY'S TREATMENT  [facility] Adult PT Treatment:                                                DATE: 10/17/21 Therapeutic Exercise: Seated FF over p-ball Seated lateral flexion over ball 3/3 Supine SKTC alternating 10/10 LTR 30s x3 B Manual Therapy: TP release to R piriformis in L sidelie  Therapeutic Activity: 5x STS 18s 2 MWT for 237ft   PATIENT EDUCATION:  Education details: Eval findings, POC, HEP Person educated: Patient Education method: Explanation, Demonstration, Tactile cues, Verbal cues, and Handouts Education comprehension: verbalized understanding, returned demonstration, verbal cues required, and tactile cues required     HOME EXERCISE PROGRAM: Access Code: [email or web address] URL: [email or web address] Date: 10/10/2021 Prepared by: Gar Ponto   Exercises Seated Flexion Stretch with Swiss Ball - 3-6 x daily - 7 x weekly - 3 sets - 6 reps - 15 hold Seated trunk flexion using rolling stool, forward and laterally, x6, 15"      ASSESSMENT:   CLINICAL IMPRESSION: No changes to reports, added additional tasks to begin core activation and R piriformis stretching, TP release to R piriformis, 2 MTW and 5x STS tests performed and recorded.  Piriformis palpation did elicit RLE symptoms     REHAB POTENTIAL: Good   CLINICAL DECISION MAKING: Evolving/moderate complexity   EVALUATION COMPLEXITY: Moderate     GOALS:     SHORT TERM GOALS:   STG Name Target Date Goal status  1 Pt will be ind in an initial HEP Baseline:  10/31/21 INITIAL  2 Pt will voice understanding of measures to assist with decreasing and managing LB and R LE pain  Baseline:  10/31/21 INITIAL    LONG TERM GOALS:    LTG Name Target Date Goal status  1 Pt will be Ind in a final HEP to maintain achieved LOF  Baseline: 12/01/21 INITIAL  2 Increase pt's trunk ROM for flex, L and R lat flexion to min limitation for improved trunk function Baseline: mod limitation 12/01/21 INITIAL  3 Pt will report  a decrease in LBP and R LE pain to 2/10 or less intermittently with daily activities and work related activities as pt returns to work Baseline: 12/01/21 INITIAL  4 Pt will be able to pick of her 21 yo child without increase in pain above 2/10 Baseline:unable 12/01/21 INITIAL  5 Pt will be able to sleep in her bed without  increase in pain above 2/10 Baseline: sleeps in recliner 12/01/21 INITIAL    PLAN: PT FREQUENCY: 2x/week   PT DURATION: 6 weeks   PLANNED INTERVENTIONS: Therapeutic exercises, Therapeutic activity, Neuro Muscular re-education, Patient/Family education, Joint mobilization, Dry Needling, Electrical stimulation, Spinal mobilization, Cryotherapy, Moist heat, Taping, Traction, Ionotophoresis 4mg /ml Dexamethasone, and Manual therapy   PLAN FOR NEXT SESSION: Continue core tasks, stretches and functional activities to relieve low back pain   Lanice Shirts, PT 10/17/2021, 3:47 PM

## 2021-10-17 NOTE — Telephone Encounter (Signed)
Amy Kim is bringing to tomorrow.  I will let you  know when I have it.

## 2021-10-18 NOTE — Telephone Encounter (Signed)
Called pt to inform that the sample of Amy Kim has arrived. May stop by the SECOND floor of our office to pick up her sample. Will take 4 capsules q8hrs with food x14 days. Will need to wait 2 weeks, then submit stool sample. Letter printed re: Talicia instructions and stool submission. Orders placed for repeat stool.

## 2021-10-19 ENCOUNTER — Other Ambulatory Visit: Payer: Self-pay

## 2021-10-19 ENCOUNTER — Ambulatory Visit: Payer: 59

## 2021-10-19 DIAGNOSIS — M5441 Lumbago with sciatica, right side: Secondary | ICD-10-CM

## 2021-10-19 DIAGNOSIS — M5386 Other specified dorsopathies, lumbar region: Secondary | ICD-10-CM

## 2021-10-19 NOTE — Therapy (Addendum)
OUTPATIENT PHYSICAL THERAPY TREATMENT NOTE   Patient Name: Amy Kim MRN: [id number] DOB:09-07-1986, 36 y.o., female Today's Date: 10/19/2021  PCP: Camillia Herter, NP REFERRING PROVIDER: Camillia Herter, NP   PT End of Session - 10/19/21 1333     Visit Number 3    Number of Visits 13    Date for PT Re-Evaluation 12/01/21    Authorization Type MCD/[employer]    Progress Note Due on Visit 10    PT Start Time 1330    PT Stop Time 1400    PT Time Calculation (min) 30 min    Activity Tolerance Patient tolerated treatment well    Behavior During Therapy WFL for tasks assessed/performed             Past Medical History:  Diagnosis Date   Back pain    DDD   Diabetes ([place])    History of migraine    last one 2 days ago   IDA (iron deficiency anemia)    Pregnancy induced hypertension    Urinary frequency    Past Surgical History:  Procedure Laterality Date   CESAREAN SECTION     x 4 total   CESAREAN SECTION N/A 08/11/2015   Procedure: REPEAT CESAREAN SECTION;  Surgeon: Truett Mainland, DO;  Location: [facility];  Service: Obstetrics;  Laterality: N/A;   CESAREAN SECTION N/A 09/19/2018   Procedure: CESAREAN SECTION;  Surgeon: Osborne Oman, MD;  Location: [facility];  Service: Obstetrics;  Laterality: N/A;   CHOLECYSTECTOMY N/A 03/21/2016   Procedure: LAPAROSCOPIC CHOLECYSTECTOMY;  Surgeon: Ralene Ok, MD;  Location: [place];  Service: General;  Laterality: N/A;   Patient Active Problem List   Diagnosis Date Noted   Iron deficiency anemia due to chronic blood loss 05/30/2021   Class II obesity 08/26/2020   New onset type 2 diabetes mellitus ([place]) 01/13/2020   Thrombocytosis 01/13/2020   Essential hypertension 12/17/2019   Refusal of blood transfusions as patient is Jehovah's Witness 09/18/2018   Abdominal cyst 03/29/2015   History of C-section 03/24/2015    REFERRING DIAG: Acute low back pain, unspecified back pain laterality, unspecified whether  sciatica present  THERAPY DIAG:  Decreased ROM of lumbar spine  Acute bilateral low back pain with right-sided sciatica  PERTINENT HISTORY: Diabetes, High BMI  PRECAUTIONS: None  SUBJECTIVE: No change yet, overdid it cleaning her home, low back is sore  PAIN:  Are you having pain? Yes NPRS scale: 7/10 Pain location: low back Pain orientation: Right  PAIN TYPE: aching and throbbing Pain description: intermittent  Aggravating factors: cleaning, bending Relieving factors: rest       OBJECTIVE:    DIAGNOSTIC FINDINGS:    Xray  Lumbar 09/07/21: FINDINGS: There is no evidence of lumbar spine fracture. Alignment is normal. Intervertebral disc spaces are maintained.   IMPRESSION: Negative.     SCREENING FOR RED FLAGS: Bowel or bladder incontinence: No Cauda equina syndrome: No   COGNITION:          Overall cognitive status: Within functional limits for tasks assessed                        SENSATION:          Light touch: Appears intact             MUSCLE LENGTH: Hamstrings: Right WNLs deg, SLR neg with min increase in pain; Left WNLs deg Marcello Moores test: Right TBA deg; Left TBA deg  POSTURE:  FH c CT step off, rounded shoulders, increased lordosis   PALPATION: TTP with min. Pressure to the R paraspinals and upper gluteal region   LUMBAR AROM   AROM A/PROM  10/10/2021  Flexion Mod limited, 16" from floor. Provocation of R LBP  Extension Min limitated. Provocation of R LBP  Right lateral flexion Mod limited, 18" from from floor. Provocation of R LBP  Left lateral flexion Mod limited, 18" from floor. Provocation of R LBP  Right rotation Full. Provocation of R LBP  Left rotation Full. No povocation of pain   (Blank rows = not tested) Back ext and R latve flex increased pt's pain the most.   LE MMT: LE Myotomal screen negative   MMT Right 10/10/2021 Left 10/10/2021  Hip flexion      Hip extension      Hip abduction      Hip adduction      Hip internal  rotation      Hip external rotation      Knee flexion      Knee extension      Ankle dorsiflexion      Ankle plantarflexion      Ankle inversion      Ankle eversion       (Blank rows = not tested)   LUMBAR SPECIAL TESTS:  Slump test: Negative   FUNCTIONAL TESTS:  Complete 5xSTS and 2MWT next session   GAIT: Distance walked: Within clinic Assistive device utilized: None Level of assistance: Complete Independence Comments: Gait pattern WNls       TODAY'S TREATMENT  [facility] Adult PT Treatment:                                                DATE: 10/19/21 Therapeutic Exercise: SL clams, 15x B Supine marches, alternating 15x each R piriformis stretch 30s x3 SL open book 10x each side Manual Therapy: R piriformis release, 8 min  [facility] Adult PT Treatment:                                                DATE: 10/17/21 Therapeutic Exercise: Seated FF over p-ball Seated lateral flexion over ball 3/3 Supine SKTC alternating 10/10 LTR 30s x3 B Manual Therapy: TP release to R piriformis in L sidelie  Therapeutic Activity: 5x STS 18s 2 MWT for 234ft   PATIENT EDUCATION:  Education details: Eval findings, POC, HEP Person educated: Patient Education method: Explanation, Demonstration, Tactile cues, Verbal cues, and Handouts Education comprehension: verbalized understanding, returned demonstration, verbal cues required, and tactile cues required     HOME EXERCISE PROGRAM: Access Code: [email or web address] URL: [email or web address] Date: 10/19/2021 Prepared by: Sharlynn Oliphant  Exercises Seated Flexion Stretch with Swiss Ball - 3-6 x daily - 7 x weekly - 3 sets - 6 reps - 15 hold Supine Piriformis Stretch with Foot on Ground - 2 x daily - 7 x weekly - 1 sets - 3 reps - 30s hold Sidelying Open Book Thoracic Lumbar Rotation and Extension - 2 x daily - 7 x weekly - 1 sets - 10 reps  Exercises Seated Flexion Stretch with Swiss Ball - 3-6 x daily - 7 x weekly - 3 sets - 6 reps -  15 hold Seated trunk flexion using rolling stool, forward and laterally, x6, 15"      ASSESSMENT:   CLINICAL IMPRESSION: Less restricted in R hip/LE following last session with a decrease in RLE radicular symptoms.  Began more active exercises to activate core and improve flexibility     REHAB POTENTIAL: Good   CLINICAL DECISION MAKING: Evolving/moderate complexity   EVALUATION COMPLEXITY: Moderate     GOALS:     SHORT TERM GOALS:   STG Name Target Date Goal status  1 Pt will be ind in an initial HEP Baseline:  10/31/21 INITIAL  2 Pt will voice understanding of measures to assist with decreasing and managing LB and R LE pain  Baseline:  10/31/21 INITIAL    LONG TERM GOALS:    LTG Name Target Date Goal status  1 Pt will be Ind in a final HEP to maintain achieved LOF  Baseline: 12/01/21 INITIAL  2 Increase pt's trunk ROM for flex, L and R lat flexion to min limitation for improved trunk function Baseline: mod limitation 12/01/21 INITIAL  3 Pt will report a decrease in LBP and R LE pain to 2/10 or less intermittently with daily activities and work related activities as pt returns to work Baseline: 12/01/21 INITIAL  4 Pt will be able to pick of her 17 yo child without increase in pain above 2/10 Baseline:unable 12/01/21 INITIAL  5 Pt will be able to sleep in her bed without increase in pain above 2/10 Baseline: sleeps in recliner 12/01/21 INITIAL    PLAN: PT FREQUENCY: 2x/week   PT DURATION: 6 weeks   PLANNED INTERVENTIONS: Therapeutic exercises, Therapeutic activity, Neuro Muscular re-education, Patient/Family education, Joint mobilization, Dry Needling, Electrical stimulation, Spinal mobilization, Cryotherapy, Moist heat, Taping, Traction, Ionotophoresis 4mg /ml Dexamethasone, and Manual therapy   PLAN FOR NEXT SESSION: Continue core tasks and activation strategies, stretches and functional activities to relieve low back pain as well as promote flexibility   Lanice Shirts,  PT 10/19/2021, 1:34 PM

## 2021-10-24 ENCOUNTER — Other Ambulatory Visit: Payer: Self-pay

## 2021-10-24 ENCOUNTER — Ambulatory Visit: Payer: 59 | Attending: Family

## 2021-10-24 DIAGNOSIS — M5386 Other specified dorsopathies, lumbar region: Secondary | ICD-10-CM | POA: Insufficient documentation

## 2021-10-24 DIAGNOSIS — M5441 Lumbago with sciatica, right side: Secondary | ICD-10-CM | POA: Insufficient documentation

## 2021-10-24 NOTE — Therapy (Signed)
OUTPATIENT PHYSICAL THERAPY TREATMENT NOTE   Patient Name: Amy Kim MRN: [id number] DOB:13-Oct-1985, 36 y.o., female Today's Date: 10/24/2021  PCP: Camillia Herter, NP REFERRING PROVIDER: Camillia Herter, NP   PT End of Session - 10/24/21 1641     Visit Number 4    Number of Visits 13    Date for PT Re-Evaluation 12/01/21    Authorization Type MCD/[employer]    Progress Note Due on Visit 10    PT Start Time 6295    PT Stop Time 2841    PT Time Calculation (min) 38 min    Activity Tolerance Patient tolerated treatment well    Behavior During Therapy WFL for tasks assessed/performed              Past Medical History:  Diagnosis Date   Back pain    DDD   Diabetes ([place])    History of migraine    last one 2 days ago   IDA (iron deficiency anemia)    Pregnancy induced hypertension    Urinary frequency    Past Surgical History:  Procedure Laterality Date   CESAREAN SECTION     x 4 total   CESAREAN SECTION N/A 08/11/2015   Procedure: REPEAT CESAREAN SECTION;  Surgeon: Truett Mainland, DO;  Location: [facility];  Service: Obstetrics;  Laterality: N/A;   CESAREAN SECTION N/A 09/19/2018   Procedure: CESAREAN SECTION;  Surgeon: Osborne Oman, MD;  Location: [facility];  Service: Obstetrics;  Laterality: N/A;   CHOLECYSTECTOMY N/A 03/21/2016   Procedure: LAPAROSCOPIC CHOLECYSTECTOMY;  Surgeon: Ralene Ok, MD;  Location: [facility];  Service: General;  Laterality: N/A;   Patient Active Problem List   Diagnosis Date Noted   Iron deficiency anemia due to chronic blood loss 05/30/2021   Class II obesity 08/26/2020   New onset type 2 diabetes mellitus ([place]) 01/13/2020   Thrombocytosis 01/13/2020   Essential hypertension 12/17/2019   Refusal of blood transfusions as patient is Jehovah's Witness 09/18/2018   Abdominal cyst 03/29/2015   History of C-section 03/24/2015    REFERRING DIAG: Acute low back pain, unspecified back pain laterality, unspecified whether  sciatica present  THERAPY DIAG:  Decreased ROM of lumbar spine  Acute bilateral low back pain with right-sided sciatica  PERTINENT HISTORY: Diabetes, High BMI  PRECAUTIONS: None  SUBJECTIVE: No overall change in pain level.  Pain extends into the R LE posteriorly to the knee. Exs, heat, and massage help temporarily.  PAIN:  Are you having pain? Yes NPRS scale: 5/10 Pain location: low back Pain orientation: Right  PAIN TYPE: aching and throbbing Pain description: intermittent  Aggravating factors: cleaning, bending Relieving factors: Rest, heat sitting up straight    OBJECTIVE:    DIAGNOSTIC FINDINGS:    Xray  Lumbar 09/07/21: FINDINGS: There is no evidence of lumbar spine fracture. Alignment is normal. Intervertebral disc spaces are maintained.   IMPRESSION: Negative.     SCREENING FOR RED FLAGS: Bowel or bladder incontinence: No Cauda equina syndrome: No   COGNITION:          Overall cognitive status: Within functional limits for tasks assessed                        SENSATION:          Light touch: Appears intact             MUSCLE LENGTH: Hamstrings: Right WNLs deg, SLR neg with min increase in pain;  Left WNLs deg Marcello Moores test: Right TBA deg; Left TBA deg   POSTURE:  FH c CT step off, rounded shoulders, increased lordosis   PALPATION: TTP with min. Pressure to the R paraspinals and upper gluteal region   LUMBAR AROM   AROM A/PROM  10/10/2021  Flexion Mod limited, 16" from floor. Provocation of R LBP  Extension Min limitated. Provocation of R LBP  Right lateral flexion Mod limited, 18" from from floor. Provocation of R LBP  Left lateral flexion Mod limited, 18" from floor. Provocation of R LBP  Right rotation Full. Provocation of R LBP  Left rotation Full. No povocation of pain   (Blank rows = not tested) Back ext and R latve flex increased pt's pain the most.   LE MMT: LE Myotomal screen negative   MMT Right 10/10/2021 Left 10/10/2021  Hip  flexion      Hip extension      Hip abduction      Hip adduction      Hip internal rotation      Hip external rotation      Knee flexion      Knee extension      Ankle dorsiflexion      Ankle plantarflexion      Ankle inversion      Ankle eversion       (Blank rows = not tested)   LUMBAR SPECIAL TESTS:  Slump test: Negative   FUNCTIONAL TESTS:  Complete 5xSTS and 2MWT next session   GAIT: Distance walked: Within clinic Assistive device utilized: None Level of assistance: Complete Independence Comments: Gait pattern Wnls   [facility] Adult PT Treatment:                                                DATE: 10/24/21 Therapeutic Exercise: Prone lying, f/b prone on 1 pillow- pt reported a decrease in low back pain (3/10) and no R LE pain, f/b prone on 2 pillows, pt was less comfortable prone on 2 pillow and lowered to 1 pillow; prone on 1 pillow with hip ext. Initially with prone lying pt reported improved low back and R LE pain. With progression of extension, pt reported an increas in low back pain. Seated trunk FF and lateral flexion x5, 5 sec in each direction- Pt reported low back felt loser Hamstring stretch, seated, x2, 20 sec  Self Care: Discussed TPDN and estim benefits and precautions. Pt was provided an information foam on TPDN. Pt states she does not have a phobia, but does not like needles        TODAY'S TREATMENT  [facility] Adult PT Treatment:                                                DATE: 10/19/21 Therapeutic Exercise: SL clams, 15x B Supine marches, alternating 15x each R piriformis stretch 30s x3 SL open book 10x each side Manual Therapy: R piriformis release, 8 min  [facility] Adult PT Treatment:  DATE: 10/17/21 Therapeutic Exercise: Seated FF over p-ball Seated lateral flexion over ball 3/3 Supine SKTC alternating 10/10 LTR 30s x3 B Manual Therapy: TP release to R piriformis in L sidelie  Therapeutic Activity: 5x  STS 18s 2 MWT for 250ft   PATIENT EDUCATION:  Education details: Eval findings, POC, HEP Person educated: Patient Education method: Explanation, Demonstration, Tactile cues, Verbal cues, and Handouts Education comprehension: verbalized understanding, returned demonstration, verbal cues required, and tactile cues required     HOME EXERCISE PROGRAM: Access Code: [email or web address] URL: [email or web address] Date: 10/19/2021 Prepared by: Sharlynn Oliphant  Exercises Seated Flexion Stretch with Swiss Ball - 3-6 x daily - 7 x weekly - 3 sets - 6 reps - 15 hold Supine Piriformis Stretch with Foot on Ground - 2 x daily - 7 x weekly - 1 sets - 3 reps - 30s hold Sidelying Open Book Thoracic Lumbar Rotation and Extension - 2 x daily - 7 x weekly - 1 sets - 10 reps  Exercises Seated Flexion Stretch with Swiss Ball - 3-6 x daily - 7 x weekly - 3 sets - 6 reps - 15 hold Seated trunk flexion using rolling stool, forward and laterally, x6, 15"      ASSESSMENT:   CLINICAL IMPRESSION Completed PT for directional preference with both prone, neutral and flexion biased exs. Pt  tolerated exs and positioning with the low back in a neutral position. No movement pattern clearly helped her low back to decrease. Will complete lumbopelvic strength and flexibility exs in neutral positions and will try modalities as well to assist in the pt's reduction of low back pain.      REHAB POTENTIAL: Good   CLINICAL DECISION MAKING: Evolving/moderate complexity   EVALUATION COMPLEXITY: Moderate     GOALS:     SHORT TERM GOALS:   STG Name Target Date Goal status  1 Pt will be ind in an initial HEP Baseline:  10/31/21 INITIAL  2 Pt will voice understanding of measures to assist with decreasing and managing LB and R LE pain  Baseline:  10/31/21 INITIAL    LONG TERM GOALS:    LTG Name Target Date Goal status  1 Pt will be Ind in a final HEP to maintain achieved LOF  Baseline: 12/01/21 INITIAL  2 Increase  pt's trunk ROM for flex, L and R lat flexion to min limitation for improved trunk function Baseline: mod limitation 12/01/21 INITIAL  3 Pt will report a decrease in LBP and R LE pain to 2/10 or less intermittently with daily activities and work related activities as pt returns to work Baseline: 12/01/21 INITIAL  4 Pt will be able to pick of her 99 yo child without increase in pain above 2/10 Baseline:unable 12/01/21 INITIAL  5 Pt will be able to sleep in her bed without increase in pain above 2/10 Baseline: sleeps in recliner 12/01/21 INITIAL    PLAN: PT FREQUENCY: 2x/week   PT DURATION: 6 weeks   PLANNED INTERVENTIONS: Therapeutic exercises, Therapeutic activity, Neuro Muscular re-education, Patient/Family education, Joint mobilization, Dry Needling, Electrical stimulation, Spinal mobilization, Cryotherapy, Moist heat, Taping, Traction, Ionotophoresis 4mg /ml Dexamethasone, and Manual therapy   PLAN FOR NEXT SESSION: Continue core tasks and activation strategies, stretches and functional activities to relieve low back pain as well as promote flexibility. Use of modalities and manual therapy as indicated.   Zoee Heeney MS, PT 10/24/21 5:07 PM

## 2021-10-25 NOTE — Therapy (Incomplete)
OUTPATIENT PHYSICAL THERAPY TREATMENT NOTE   Patient Name: Amy Kim MRN: [id number] DOB:December 23, 1985, 36 y.o., female Today's Date: 10/25/2021  PCP: Camillia Herter, NP REFERRING PROVIDER: Camillia Herter, NP      Past Medical History:  Diagnosis Date   Back pain    DDD   Diabetes [facility])    History of migraine    last one 2 days ago   IDA (iron deficiency anemia)    Pregnancy induced hypertension    Urinary frequency    Past Surgical History:  Procedure Laterality Date   CESAREAN SECTION     x 4 total   CESAREAN SECTION N/A 08/11/2015   Procedure: REPEAT CESAREAN SECTION;  Surgeon: Truett Mainland, DO;  Location: [place] [facility];  Service: Obstetrics;  Laterality: N/A;   CESAREAN SECTION N/A 09/19/2018   Procedure: CESAREAN SECTION;  Surgeon: Osborne Oman, MD;  Location: [place];  Service: Obstetrics;  Laterality: N/A;   CHOLECYSTECTOMY N/A 03/21/2016   Procedure: LAPAROSCOPIC CHOLECYSTECTOMY;  Surgeon: Ralene Ok, MD;  Location: [place];  Service: General;  Laterality: N/A;   Patient Active Problem List   Diagnosis Date Noted   Iron deficiency anemia due to chronic blood loss 05/30/2021   Class II obesity 08/26/2020   New onset type 2 diabetes mellitus ([place]) 01/13/2020   Thrombocytosis 01/13/2020   Essential hypertension 12/17/2019   Refusal of blood transfusions as patient is Jehovah's Witness 09/18/2018   Abdominal cyst 03/29/2015   History of C-section 03/24/2015    REFERRING DIAG: Acute low back pain, unspecified back pain laterality, unspecified whether sciatica present  THERAPY DIAG:  No diagnosis found.  PERTINENT HISTORY: Diabetes, High BMI  PRECAUTIONS: None  SUBJECTIVE: No overall change in pain level.  Pain extends into the R LE posteriorly to the knee. Exs, heat, and massage help temporarily.  PAIN:  Are you having pain? Yes NPRS scale: 5/10 Pain location: low back Pain orientation: Right  PAIN TYPE: aching and throbbing Pain  description: intermittent  Aggravating factors: cleaning, bending Relieving factors: Rest, heat sitting up straight    OBJECTIVE:    DIAGNOSTIC FINDINGS:    Xray  Lumbar 09/07/21: FINDINGS: There is no evidence of lumbar spine fracture. Alignment is normal. Intervertebral disc spaces are maintained.   IMPRESSION: Negative.     SCREENING FOR RED FLAGS: Bowel or bladder incontinence: No Cauda equina syndrome: No   COGNITION:          Overall cognitive status: Within functional limits for tasks assessed                        SENSATION:          Light touch: Appears intact             MUSCLE LENGTH: Hamstrings: Right WNLs deg, SLR neg with min increase in pain; Left WNLs deg Marcello Moores test: Right TBA deg; Left TBA deg   POSTURE:  FH c CT step off, rounded shoulders, increased lordosis   PALPATION: TTP with min. Pressure to the R paraspinals and upper gluteal region   LUMBAR AROM   AROM A/PROM  10/10/2021  Flexion Mod limited, 16" from floor. Provocation of R LBP  Extension Min limitated. Provocation of R LBP  Right lateral flexion Mod limited, 18" from from floor. Provocation of R LBP  Left lateral flexion Mod limited, 18" from floor. Provocation of R LBP  Right rotation Full. Provocation of R LBP  Left rotation Full.  No povocation of pain   (Blank rows = not tested) Back ext and R latve flex increased pt's pain the most.   LE MMT: LE Myotomal screen negative   MMT Right 10/10/2021 Left 10/10/2021  Hip flexion      Hip extension      Hip abduction      Hip adduction      Hip internal rotation      Hip external rotation      Knee flexion      Knee extension      Ankle dorsiflexion      Ankle plantarflexion      Ankle inversion      Ankle eversion       (Blank rows = not tested)   LUMBAR SPECIAL TESTS:  Slump test: Negative   FUNCTIONAL TESTS:  Complete 5xSTS and 2MWT next session   GAIT: Distance walked: Within clinic Assistive device utilized:  None Level of assistance: Complete Independence Comments: Gait pattern Wnl  [facility] Adult PT Treatment:                                                DATE: 10/26/21  Therapeutic Exercise: *** Manual Therapy: *** Neuromuscular re-ed: *** Therapeutic Activity: *** Modalities: *** Self Care: ***    [facility] Adult PT Treatment:                                                DATE: 10/24/21 Therapeutic Exercise: Prone lying, f/b prone on 1 pillow- pt reported a decrease in low back pain (3/10) and no R LE pain, f/b prone on 2 pillows, pt was less comfortable prone on 2 pillow and lowered to 1 pillow; prone on 1 pillow with hip ext. Initially with prone lying pt reported improved low back and R LE pain. With progression of extension, pt reported an increas in low back pain. Seated trunk FF and lateral flexion x5, 5 sec in each direction- Pt reported low back felt loser Hamstring stretch, seated, x2, 20 sec  Self Care: Discussed TPDN and estim benefits and precautions. Pt was provided an information foam on TPDN. Pt states she does not have a phobia, but does not like needles        TODAY'S TREATMENT  [facility] Adult PT Treatment:                                                DATE: 10/19/21 Therapeutic Exercise: SL clams, 15x B Supine marches, alternating 15x each R piriformis stretch 30s x3 SL open book 10x each side Manual Therapy: R piriformis release, 8 min  [facility] Adult PT Treatment:                                                DATE: 10/17/21 Therapeutic Exercise: Seated FF over p-ball Seated lateral flexion over ball 3/3 Supine SKTC alternating 10/10 LTR 30s x3 B Manual Therapy: TP release to R piriformis  in L sidelie  Therapeutic Activity: 5x STS 18s 2 MWT for 261ft   PATIENT EDUCATION:  Education details: Eval findings, POC, HEP Person educated: Patient Education method: Explanation, Demonstration, Tactile cues, Verbal cues, and Handouts Education comprehension:  verbalized understanding, returned demonstration, verbal cues required, and tactile cues required     HOME EXERCISE PROGRAM: Access Code: [email or web address] URL: [email or web address] Date: 10/19/2021 Prepared by: Sharlynn Oliphant  Exercises Seated Flexion Stretch with Swiss Ball - 3-6 x daily - 7 x weekly - 3 sets - 6 reps - 15 hold Supine Piriformis Stretch with Foot on Ground - 2 x daily - 7 x weekly - 1 sets - 3 reps - 30s hold Sidelying Open Book Thoracic Lumbar Rotation and Extension - 2 x daily - 7 x weekly - 1 sets - 10 reps  Exercises Seated Flexion Stretch with Swiss Ball - 3-6 x daily - 7 x weekly - 3 sets - 6 reps - 15 hold Seated trunk flexion using rolling stool, forward and laterally, x6, 15"      ASSESSMENT:   CLINICAL IMPRESSION Completed PT for directional preference with both prone, neutral and flexion biased exs. Pt  tolerated exs and positioning with the low back in a neutral position. No movement pattern clearly helped her low back to decrease. Will complete lumbopelvic strength and flexibility exs in neutral positions and will try modalities as well to assist in the pt's reduction of low back pain.      REHAB POTENTIAL: Good   CLINICAL DECISION MAKING: Evolving/moderate complexity   EVALUATION COMPLEXITY: Moderate     GOALS:     SHORT TERM GOALS:   STG Name Target Date Goal status  1 Pt will be ind in an initial HEP Baseline:  10/31/21 INITIAL  2 Pt will voice understanding of measures to assist with decreasing and managing LB and R LE pain  Baseline:  10/31/21 INITIAL    LONG TERM GOALS:    LTG Name Target Date Goal status  1 Pt will be Ind in a final HEP to maintain achieved LOF  Baseline: 12/01/21 INITIAL  2 Increase pt's trunk ROM for flex, L and R lat flexion to min limitation for improved trunk function Baseline: mod limitation 12/01/21 INITIAL  3 Pt will report a decrease in LBP and R LE pain to 2/10 or less intermittently with daily  activities and work related activities as pt returns to work Baseline: 12/01/21 INITIAL  4 Pt will be able to pick of her 68 yo child without increase in pain above 2/10 Baseline:unable 12/01/21 INITIAL  5 Pt will be able to sleep in her bed without increase in pain above 2/10 Baseline: sleeps in recliner 12/01/21 INITIAL    PLAN: PT FREQUENCY: 2x/week   PT DURATION: 6 weeks   PLANNED INTERVENTIONS: Therapeutic exercises, Therapeutic activity, Neuro Muscular re-education, Patient/Family education, Joint mobilization, Dry Needling, Electrical stimulation, Spinal mobilization, Cryotherapy, Moist heat, Taping, Traction, Ionotophoresis 4mg /ml Dexamethasone, and Manual therapy   PLAN FOR NEXT SESSION: Continue core tasks and activation strategies, stretches and functional activities to relieve low back pain as well as promote flexibility. Use of modalities and manual therapy as indicated.   Tao Satz MS, PT 10/25/21 9:16 PM

## 2021-10-26 ENCOUNTER — Ambulatory Visit: Payer: Managed Care, Other (non HMO) | Attending: Family

## 2021-10-26 ENCOUNTER — Telehealth: Payer: Self-pay

## 2021-10-26 DIAGNOSIS — M5386 Other specified dorsopathies, lumbar region: Secondary | ICD-10-CM | POA: Insufficient documentation

## 2021-10-26 DIAGNOSIS — M5441 Lumbago with sciatica, right side: Secondary | ICD-10-CM | POA: Insufficient documentation

## 2021-10-26 NOTE — Telephone Encounter (Signed)
LVM re: no show appt today. Reminded pt of upcoming appt on 10/31/21 and of the attendance policy.

## 2021-10-28 ENCOUNTER — Other Ambulatory Visit: Payer: 59

## 2021-10-30 ENCOUNTER — Other Ambulatory Visit: Payer: Self-pay | Admitting: Surgery

## 2021-10-30 DIAGNOSIS — M5416 Radiculopathy, lumbar region: Secondary | ICD-10-CM

## 2021-10-31 ENCOUNTER — Ambulatory Visit: Payer: Managed Care, Other (non HMO)

## 2021-10-31 ENCOUNTER — Other Ambulatory Visit: Payer: Self-pay

## 2021-10-31 DIAGNOSIS — M5441 Lumbago with sciatica, right side: Secondary | ICD-10-CM

## 2021-10-31 DIAGNOSIS — M5386 Other specified dorsopathies, lumbar region: Secondary | ICD-10-CM | POA: Diagnosis not present

## 2021-10-31 NOTE — Therapy (Signed)
OUTPATIENT PHYSICAL THERAPY TREATMENT NOTE   Patient Name: Amy Kim MRN: [id number] DOB:1985/11/24, 36 y.o., female Today's Date: 11/01/2021  PCP: Camillia Herter, NP REFERRING PROVIDER: Camillia Herter, NP   PT End of Session - 10/31/21 1605     Visit Number 5    Number of Visits 13    Date for PT Re-Evaluation 12/01/21    Authorization Type M[employer]    Progress Note Due on Visit 10    PT Start Time 1550    PT Stop Time 1630    PT Time Calculation (min) 40 min    Activity Tolerance Patient tolerated treatment well    Behavior During Therapy WFL for tasks assessed/performed               Past Medical History:  Diagnosis Date   Back pain    DDD   Diabetes ([place])    History of migraine    last one 2 days ago   IDA (iron deficiency anemia)    Pregnancy induced hypertension    Urinary frequency    Past Surgical History:  Procedure Laterality Date   CESAREAN SECTION     x 4 total   CESAREAN SECTION N/A 08/11/2015   Procedure: REPEAT CESAREAN SECTION;  Surgeon: Truett Mainland, DO;  Location: [place] [facility];  Service: Obstetrics;  Laterality: N/A;   CESAREAN SECTION N/A 09/19/2018   Procedure: CESAREAN SECTION;  Surgeon: Osborne Oman, MD;  Location: [place];  Service: Obstetrics;  Laterality: N/A;   CHOLECYSTECTOMY N/A 03/21/2016   Procedure: LAPAROSCOPIC CHOLECYSTECTOMY;  Surgeon: Ralene Ok, MD;  Location: [place];  Service: General;  Laterality: N/A;   Patient Active Problem List   Diagnosis Date Noted   Iron deficiency anemia due to chronic blood loss 05/30/2021   Class II obesity 08/26/2020   New onset type 2 diabetes mellitus ([place]) 01/13/2020   Thrombocytosis 01/13/2020   Essential hypertension 12/17/2019   Refusal of blood transfusions as patient is Jehovah's Witness 09/18/2018   Abdominal cyst 03/29/2015   History of C-section 03/24/2015    REFERRING DIAG: Acute low back pain, unspecified back pain laterality, unspecified whether  sciatica present  THERAPY DIAG:  Decreased ROM of lumbar spine  Acute bilateral low back pain with right-sided sciatica  PERTINENT HISTORY: Diabetes, High BMI  PRECAUTIONS: None  SUBJECTIVE: Pt reports the therex seem to be helping she is experiencing some improvement in her pain describing, "The pain is not as unbearable as often". Pt notes she is completing her HEP consistently.  PAIN:  Are you having pain? Yes NPRS scale: 3/10 Pain location: low back Pain orientation: Right  PAIN TYPE: aching and throbbing Pain description: intermittent  Aggravating factors: cleaning, bending Relieving factors: Rest, heat sitting up straight    OBJECTIVE:    DIAGNOSTIC FINDINGS:    Xray  Lumbar 09/07/21: FINDINGS: There is no evidence of lumbar spine fracture. Alignment is normal. Intervertebral disc spaces are maintained.   IMPRESSION: Negative.     SCREENING FOR RED FLAGS: Bowel or bladder incontinence: No Cauda equina syndrome: No   COGNITION:          Overall cognitive status: Within functional limits for tasks assessed                        SENSATION:          Light touch: Appears intact             MUSCLE LENGTH:  Hamstrings: Right WNLs deg, SLR neg with min increase in pain; Left WNLs deg Marcello Moores test: Right TBA deg; Left TBA deg   POSTURE:  FH c CT step off, rounded shoulders, increased lordosis   PALPATION: TTP with min. Pressure to the R paraspinals and upper gluteal region   LUMBAR AROM   AROM A/PROM  10/10/2021  Flexion Mod limited, 16" from floor. Provocation of R LBP  Extension Min limitated. Provocation of R LBP  Right lateral flexion Mod limited, 18" from from floor. Provocation of R LBP  Left lateral flexion Mod limited, 18" from floor. Provocation of R LBP  Right rotation Full. Provocation of R LBP  Left rotation Full. No povocation of pain   (Blank rows = not tested) Back ext and R latve flex increased pt's pain the most.   LE MMT: LE Myotomal  screen negative   MMT Right 10/10/2021 Left 10/10/2021  Hip flexion      Hip extension      Hip abduction      Hip adduction      Hip internal rotation      Hip external rotation      Knee flexion      Knee extension      Ankle dorsiflexion      Ankle plantarflexion      Ankle inversion      Ankle eversion       (Blank rows = not tested)   LUMBAR SPECIAL TESTS:  Slump test: Negative   FUNCTIONAL TESTS:  Complete 5xSTS and 2MWT next session   GAIT: Distance walked: Within clinic Assistive device utilized: None Level of assistance: Complete Independence Comments: Gait pattern Wnls   [facility] Adult PT Treatment:                                                DATE: 10/31/21 Therapeutic Exercise: Seated trunk FF and lateral flexion x5, 5 sec in each direction- Pt reported low back felt loser Hamstring stretch, seated, x2, 20 sec Seated QL stretch, x2, 20 sec Piriformis stretch, x2, 20 sec PPT, 2x10, 3" HL clamshell, 2x10, GTB, 3"  Self Care: Updated HEP    [facility] Adult PT Treatment:                                                DATE: 10/24/21 Therapeutic Exercise: Prone lying, f/b prone on 1 pillow- pt reported a decrease in low back pain (3/10) and no R LE pain, f/b prone on 2 pillows, pt was less comfortable prone on 2 pillow and lowered to 1 pillow; prone on 1 pillow with hip ext. Initially with prone lying pt reported improved low back and R LE pain. With progression of extension, pt reported an increas in low back pain. Seated trunk FF and lateral flexion x5, 5 sec in each direction- Pt reported low back felt loser Hamstring stretch, seated, x2, 20 sec  Self Care: Discussed TPDN and estim benefits and precautions. Pt was provided an information foam on TPDN. Pt states she does not have a phobia, but does not like needles        TODAY'S TREATMENT  [facility] Adult PT Treatment:  DATE: 10/19/21 Therapeutic Exercise: SL clams, 15x  B Supine marches, alternating 15x each R piriformis stretch 30s x3 SL open book 10x each side Manual Therapy: R piriformis release, 8 min  [facility] Adult PT Treatment:                                                DATE: 10/17/21 Therapeutic Exercise: Seated FF over p-ball Seated lateral flexion over ball 3/3 Supine SKTC alternating 10/10 LTR 30s x3 B Manual Therapy: TP release to R piriformis in L sidelie  Therapeutic Activity: 5x STS 18s 2 MWT for 253ft   PATIENT EDUCATION:  Education details: Eval findings, POC, HEP Person educated: Patient Education method: Explanation, Demonstration, Tactile cues, Verbal cues, and Handouts Education comprehension: verbalized understanding, returned demonstration, verbal cues required, and tactile cues required     HOME EXERCISE PROGRAM:   Access Code: [email or web address] URL: [email or web address] Date: 11/01/2021 Prepared by: Gar Ponto  Exercises Seated Flexion Stretch with Swiss Ball - 3-6 x daily - 7 x weekly - 1 sets - 6 reps - 15 hold Supine Piriformis Stretch with Foot on Ground - 2 x daily - 7 x weekly - 1 sets - 3 reps - 20 hold Sidelying Open Book Thoracic Lumbar Rotation and Extension - 2 x daily - 7 x weekly - 1 sets - 10 reps Seated Hamstring Stretch - 2 x daily - 7 x weekly - 1 sets - 3 reps - 20 hold Seated Quadratus Lumborum Stretch in Chair - 2 x daily - 7 x weekly - 1 sets - 3 reps - 20 hold Supine Posterior Pelvic Tilt - 2 x daily - 7 x weekly - 1 sets - 10 reps - 3 hold Hooklying Clamshell with Resistance - 2 x daily - 7 x weekly - 1 sets - 10 reps - 3 hold     ASSESSMENT:   CLINICAL IMPRESSION PT was completed for lumbopelvic/LE flexibility and strengthening therex. Pt's subjective report indicates the flexibility exs are helping to decrease her pain intermittently. Core strengthening therex were added today with the pt tolerating with without adverse effects. Pt will continue to benefit from skilled PT to  address deficits to optimize her function with decreased pain.    REHAB POTENTIAL: Good   CLINICAL DECISION MAKING: Evolving/moderate complexity   EVALUATION COMPLEXITY: Moderate     GOALS:     SHORT TERM GOALS:   STG Name Target Date Goal status  1 Pt will be ind in an initial HEP Baseline:  10/31/21 INITIAL  2 Pt will voice understanding of measures to assist with decreasing and managing LB and R LE pain  Baseline:  10/31/21 INITIAL    LONG TERM GOALS:    LTG Name Target Date Goal status  1 Pt will be Ind in a final HEP to maintain achieved LOF  Baseline: 12/01/21 INITIAL  2 Increase pt's trunk ROM for flex, L and R lat flexion to min limitation for improved trunk function Baseline: mod limitation 12/01/21 INITIAL  3 Pt will report a decrease in LBP and R LE pain to 2/10 or less intermittently with daily activities and work related activities as pt returns to work Baseline: 12/01/21 INITIAL  4 Pt will be able to pick of her 32 yo child without increase in pain above 2/10 Baseline:unable 12/01/21 INITIAL  5 Pt will be able to  sleep in her bed without increase in pain above 2/10 Baseline: sleeps in recliner 12/01/21 INITIAL    PLAN: PT FREQUENCY: 2x/week   PT DURATION: 6 weeks   PLANNED INTERVENTIONS: Therapeutic exercises, Therapeutic activity, Neuro Muscular re-education, Patient/Family education, Joint mobilization, Dry Needling, Electrical stimulation, Spinal mobilization, Cryotherapy, Moist heat, Taping, Traction, Ionotophoresis 4mg /ml Dexamethasone, and Manual therapy   PLAN FOR NEXT SESSION: Continue core tasks and activation strategies, stretches and functional activities to relieve low back pain as well as promote flexibility. Use of modalities and manual therapy as indicated. Assess STGs.  Avenir Lozinski MS, PT 11/01/21 6:00 AM

## 2021-11-02 ENCOUNTER — Other Ambulatory Visit: Payer: Self-pay

## 2021-11-02 ENCOUNTER — Ambulatory Visit: Payer: Managed Care, Other (non HMO)

## 2021-11-02 DIAGNOSIS — M5386 Other specified dorsopathies, lumbar region: Secondary | ICD-10-CM

## 2021-11-02 DIAGNOSIS — M5441 Lumbago with sciatica, right side: Secondary | ICD-10-CM

## 2021-11-02 NOTE — Therapy (Signed)
OUTPATIENT PHYSICAL THERAPY TREATMENT NOTE   Patient Name: Amy Kim MRN: [id number] DOB:Feb 01, 1986, 36 y.o., female Today's Date: 11/02/2021  PCP: Camillia Herter, NP REFERRING PROVIDER: Camillia Herter, NP   PT End of Session - 11/02/21 1558     Visit Number 6    Number of Visits 13    Authorization Type MCD/[employer]    Progress Note Due on Visit 10    PT Start Time 4098    PT Stop Time 1191    PT Time Calculation (min) 45 min    Activity Tolerance Patient tolerated treatment well    Behavior During Therapy WFL for tasks assessed/performed                Past Medical History:  Diagnosis Date   Back pain    DDD   Diabetes ([place])    History of migraine    last one 2 days ago   IDA (iron deficiency anemia)    Pregnancy induced hypertension    Urinary frequency    Past Surgical History:  Procedure Laterality Date   CESAREAN SECTION     x 4 total   CESAREAN SECTION N/A 08/11/2015   Procedure: REPEAT CESAREAN SECTION;  Surgeon: Truett Mainland, DO;  Location: [facility];  Service: Obstetrics;  Laterality: N/A;   CESAREAN SECTION N/A 09/19/2018   Procedure: CESAREAN SECTION;  Surgeon: Osborne Oman, MD;  Location: [facility];  Service: Obstetrics;  Laterality: N/A;   CHOLECYSTECTOMY N/A 03/21/2016   Procedure: LAPAROSCOPIC CHOLECYSTECTOMY;  Surgeon: Ralene Ok, MD;  Location: [facility];  Service: General;  Laterality: N/A;   Patient Active Problem List   Diagnosis Date Noted   Iron deficiency anemia due to chronic blood loss 05/30/2021   Class II obesity 08/26/2020   New onset type 2 diabetes mellitus ([place]) 01/13/2020   Thrombocytosis 01/13/2020   Essential hypertension 12/17/2019   Refusal of blood transfusions as patient is Jehovah's Witness 09/18/2018   Abdominal cyst 03/29/2015   History of C-section 03/24/2015    REFERRING DIAG: Acute low back pain, unspecified back pain laterality, unspecified whether sciatica present  THERAPY DIAG:   Decreased ROM of lumbar spine  Acute bilateral low back pain with right-sided sciatica  PERTINENT HISTORY: Diabetes, High BMI  PRECAUTIONS: None  SUBJECTIVE: Pt reports she has been doing better, but she moved a couch cleaning and that aggravated her back.  PAIN:  Are you having pain? Yes NPRS scale: 5/10 Pain location: low back Pain orientation: Right  PAIN TYPE: aching and throbbing Pain description: intermittent  Aggravating factors: cleaning, bending Relieving factors: Rest, heat sitting up straight    OBJECTIVE:    DIAGNOSTIC FINDINGS:    Xray  Lumbar 09/07/21: FINDINGS: There is no evidence of lumbar spine fracture. Alignment is normal. Intervertebral disc spaces are maintained.   IMPRESSION: Negative.     SCREENING FOR RED FLAGS: Bowel or bladder incontinence: No Cauda equina syndrome: No   COGNITION:          Overall cognitive status: Within functional limits for tasks assessed                        SENSATION:          Light touch: Appears intact             MUSCLE LENGTH: Hamstrings: Right WNLs deg, SLR neg with min increase in pain; Left WNLs deg Marcello Moores test: Right TBA deg; Left TBA deg  POSTURE:  FH c CT step off, rounded shoulders, increased lordosis   PALPATION: TTP with min. Pressure to the R paraspinals and upper gluteal region   LUMBAR AROM   AROM A/PROM  10/10/2021  Flexion Mod limited, 16" from floor. Provocation of R LBP  Extension Min limitated. Provocation of R LBP  Right lateral flexion Mod limited, 18" from from floor. Provocation of R LBP  Left lateral flexion Mod limited, 18" from floor. Provocation of R LBP  Right rotation Full. Provocation of R LBP  Left rotation Full. No povocation of pain   (Blank rows = not tested) Back ext and R latve flex increased pt's pain the most.   LE MMT: LE Myotomal screen negative   MMT Right 10/10/2021 Left 10/10/2021  Hip flexion      Hip extension      Hip abduction      Hip  adduction      Hip internal rotation      Hip external rotation      Knee flexion      Knee extension      Ankle dorsiflexion      Ankle plantarflexion      Ankle inversion      Ankle eversion       (Blank rows = not tested)   LUMBAR SPECIAL TESTS:  Slump test: Negative   FUNCTIONAL TESTS:  Complete 5xSTS and 2MWT next session   GAIT: Distance walked: Within clinic Assistive device utilized: None Level of assistance: Complete Independence Comments: Gait pattern Wnls  TODAY'S TREATMENT : [facility] Adult PT Treatment:                                                DATE: 11/02/21 Therapeutic Exercise:  Nu-step 5 mins, L5, UE/LE Seated trunk FF and lateral flexion x5, 5 sec in each direction c theraball Hamstring stretch, seated, x2, 20 sec Seated QL stretch, x2, 20 sec Piriformis stretch, x2, 20 sec PPT, 2x10, 3" HL clamshell, 2x10, GTB, 3" Bridging, 2x5, 3" Hip add set, 2x10, 3'  Self Care: Updated HEP    [facility] Adult PT Treatment:                                                DATE: 10/31/21 Therapeutic Exercise: Seated trunk FF and lateral flexion x5, 5 sec in each direction c theraball- Pt reported low back felt loser Hamstring stretch, seated, x2, 20 sec Seated QL stretch, x2, 20 sec Piriformis stretch, x2, 20 sec PPT, 2x10, 3" HL clamshell, 2x10, GTB, 3"  Self Care: Updated HEP    [facility] Adult PT Treatment:                                                DATE: 10/24/21 Therapeutic Exercise: Prone lying, f/b prone on 1 pillow- pt reported a decrease in low back pain (3/10) and no R LE pain, f/b prone on 2 pillows, pt was less comfortable prone on 2 pillow and lowered to 1 pillow; prone on 1 pillow with hip ext. Initially with prone lying pt reported  improved low back and R LE pain. With progression of extension, pt reported an increas in low back pain. Seated trunk FF and lateral flexion x5, 5 sec in each direction- Pt reported low back felt loser Hamstring stretch,  seated, x2, 20 sec  Self Care: Discussed TPDN and estim benefits and precautions. Pt was provided an information foam on TPDN. Pt states she does not have a phobia, but does not like needles    PATIENT EDUCATION:  Education details: Eval findings, POC, HEP Person educated: Patient Education method: Explanation, Demonstration, Tactile cues, Verbal cues, and Handouts Education comprehension: verbalized understanding, returned demonstration, verbal cues required, and tactile cues required     HOME EXERCISE PROGRAM: Access Code: [email or web address] URL: [email or web address] Date: 11/02/2021 Prepared by: Gar Ponto  Exercises Seated Flexion Stretch with Swiss Ball - 3-6 x daily - 7 x weekly - 1 sets - 6 reps - 15 hold Supine Piriformis Stretch with Foot on Ground - 2 x daily - 7 x weekly - 1 sets - 3 reps - 20 hold Sidelying Open Book Thoracic Lumbar Rotation and Extension - 2 x daily - 7 x weekly - 1 sets - 10 reps Seated Hamstring Stretch - 2 x daily - 7 x weekly - 1 sets - 3 reps - 20 hold Seated Quadratus Lumborum Stretch in Chair - 2 x daily - 7 x weekly - 1 sets - 3 reps - 20 hold Supine Posterior Pelvic Tilt - 2 x daily - 7 x weekly - 1 sets - 10 reps - 3 hold Hooklying Clamshell with Resistance - 2 x daily - 7 x weekly - 1 sets - 10 reps - 3 hold Supine Bridge - 2 x daily - 7 x weekly - 1 sets - 10 reps - 3 hold Supine Hip Adduction Isometric with Ball - 2 x daily - 7 x weekly - 1 sets - 10 reps - 3 hold      ASSESSMENT:   CLINICAL IMPRESSION Pt presents with her low back bothering her more after moving a couch earlier today. PT was completed for lumbopelvic/LE flexibility and strengthening. PT continued to add strengthening therex, which were also added to the pt's HEP. Pt completed the therex properly. Pt notes she is using her therex, moist heat, and a massage pad to to assist with pain management. Pt is demonstrating signs of progress with improved mobility and  tolerance to activity.    Objective Impairments: Decreased activity tolerance, decreased ROM, postural dysfunction, obesity, and pain.     GOALS:     SHORT TERM GOALS:   STG Name Target Date Goal status  1 Pt will be ind in an initial HEP Baseline:  10/31/21 MET  2 Pt will voice understanding of measures to assist with decreasing and managing LB and R LE pain  Baseline:  10/31/21 MET    LONG TERM GOALS:    LTG Name Target Date Goal status  1 Pt will be Ind in a final HEP to maintain achieved LOF  Baseline: 12/01/21 INITIAL  2 Increase pt's trunk ROM for flex, L and R lat flexion to min limitation for improved trunk function Baseline: mod limitation 12/01/21 INITIAL  3 Pt will report a decrease in LBP and R LE pain to 2/10 or less intermittently with daily activities and work related activities as pt returns to work Baseline: 12/01/21 INITIAL  4 Pt will be able to pick of her 58 yo child without increase in pain above 2/10 Baseline:unable 12/01/21 INITIAL  5 Pt will be able to sleep in her bed without increase in pain above 2/10 Baseline: sleeps in recliner 12/01/21 INITIAL    PLAN: PT FREQUENCY: 2x/week   PT DURATION: 6 weeks   PLANNED INTERVENTIONS: Therapeutic exercises, Therapeutic activity, Neuro Muscular re-education, Patient/Family education, Joint mobilization, Dry Needling, Electrical stimulation, Spinal mobilization, Cryotherapy, Moist heat, Taping, Traction, Ionotophoresis 69m/ml Dexamethasone, and Manual therapy   PLAN FOR NEXT SESSION: Continue core tasks and activation strategies, stretches and functional activities to relieve low back pain as well as promote flexibility. Use of modalities and manual therapy as indicated. Assess STGs.  Mashanda Ishibashi MS, PT 11/02/21 5:09 PM

## 2021-11-04 ENCOUNTER — Ambulatory Visit
Admission: RE | Admit: 2021-11-04 | Discharge: 2021-11-04 | Disposition: A | Discharge status: Home / Self Care | Payer: 59 | Source: Ambulatory Visit | Attending: Surgery | Admitting: Surgery

## 2021-11-04 ENCOUNTER — Other Ambulatory Visit: Payer: Self-pay

## 2021-11-04 DIAGNOSIS — M5416 Radiculopathy, lumbar region: Secondary | ICD-10-CM

## 2021-11-07 ENCOUNTER — Encounter: Payer: Self-pay | Admitting: Family

## 2021-11-07 ENCOUNTER — Ambulatory Visit: Payer: Managed Care, Other (non HMO)

## 2021-11-07 ENCOUNTER — Other Ambulatory Visit: Payer: Self-pay

## 2021-11-07 DIAGNOSIS — M5386 Other specified dorsopathies, lumbar region: Secondary | ICD-10-CM | POA: Diagnosis not present

## 2021-11-07 DIAGNOSIS — M5441 Lumbago with sciatica, right side: Secondary | ICD-10-CM

## 2021-11-07 NOTE — Therapy (Signed)
OUTPATIENT PHYSICAL THERAPY TREATMENT NOTE   Patient Name: Amy Kim MRN: [id number] DOB:January 05, 1986, 35 y.o., female Today's Date: 11/07/2021  PCP: Camillia Herter, NP REFERRING PROVIDER: Camillia Herter, NP   PT End of Session - 11/07/21 1604     Visit Number 6    Number of Visits 13    Date for PT Re-Evaluation 12/01/21    Authorization Type MCD/[employer]    Progress Note Due on Visit 10    PT Start Time 7588    PT Stop Time 1632    PT Time Calculation (min) 43 min    Activity Tolerance Patient tolerated treatment well    Behavior During Therapy WFL for tasks assessed/performed                 Past Medical History:  Diagnosis Date   Back pain    DDD   Diabetes ([place])    History of migraine    last one 2 days ago   IDA (iron deficiency anemia)    Pregnancy induced hypertension    Urinary frequency    Past Surgical History:  Procedure Laterality Date   CESAREAN SECTION     x 4 total   CESAREAN SECTION N/A 08/11/2015   Procedure: REPEAT CESAREAN SECTION;  Surgeon: Truett Mainland, DO;  Location: [facility];  Service: Obstetrics;  Laterality: N/A;   CESAREAN SECTION N/A 09/19/2018   Procedure: CESAREAN SECTION;  Surgeon: Osborne Oman, MD;  Location: [facility];  Service: Obstetrics;  Laterality: N/A;   CHOLECYSTECTOMY N/A 03/21/2016   Procedure: LAPAROSCOPIC CHOLECYSTECTOMY;  Surgeon: Ralene Ok, MD;  Location: [facility];  Service: General;  Laterality: N/A;   Patient Active Problem List   Diagnosis Date Noted   Iron deficiency anemia due to chronic blood loss 05/30/2021   Class II obesity 08/26/2020   New onset type 2 diabetes mellitus ([place]) 01/13/2020   Thrombocytosis 01/13/2020   Essential hypertension 12/17/2019   Refusal of blood transfusions as patient is Jehovah's Witness 09/18/2018   Abdominal cyst 03/29/2015   History of C-section 03/24/2015    REFERRING DIAG: Acute low back pain, unspecified back pain laterality, unspecified  whether sciatica present  THERAPY DIAG:  Decreased ROM of lumbar spine  Acute bilateral low back pain with right-sided sciatica  PERTINENT HISTORY: Diabetes, High BMI  PRECAUTIONS: None  SUBJECTIVE: Pt reports she is able to get through around half her day without her low back bothering her, so she feels like she is 50% better. Pt notes she is able to pick up her 93 yo child and to complete more cleaning around her home.  PAIN:  Are you having pain? Yes NPRS scale: 5/10 Pain location: low back Pain orientation: Right  PAIN TYPE: aching and throbbing Pain description: intermittent  Aggravating factors: cleaning, bending Relieving factors: Rest, heat sitting up straight    OBJECTIVE:    DIAGNOSTIC FINDINGS:   IMPRESSION:MRI Lumbar 11/04/21 1. Moderate right-sided facet arthrosis at L5-S1 with associated reactive marrow edema. Finding could serve as a source for lower right lower back pain and/or right-sided radicular symptoms. 2. Mild degenerative disc disease at L3-4 without stenosis or impingement.   Xray  Lumbar 09/07/21: FINDINGS: There is no evidence of lumbar spine fracture. Alignment is normal. Intervertebral disc spaces are maintained.   IMPRESSION: Negative.     SCREENING FOR RED FLAGS: Bowel or bladder incontinence: No Cauda equina syndrome: No   COGNITION:          Overall cognitive  status: Within functional limits for tasks assessed                        SENSATION:          Light touch: Appears intact             MUSCLE LENGTH: Hamstrings: Right WNLs deg, SLR neg with min increase in pain; Left WNLs deg Marcello Moores test: Right TBA deg; Left TBA deg   POSTURE:  FH c CT step off, rounded shoulders, increased lordosis   PALPATION: TTP with min. Pressure to the R paraspinals and upper gluteal region   LUMBAR AROM   AROM A/PROM  10/10/2021  Flexion Mod limited, 16" from floor. Provocation of R LBP  Extension Min limitated. Provocation of R LBP   Right lateral flexion Mod limited, 18" from from floor. Provocation of R LBP  Left lateral flexion Mod limited, 18" from floor. Provocation of R LBP  Right rotation Full. Provocation of R LBP  Left rotation Full. No povocation of pain   (Blank rows = not tested) Back ext and R latve flex increased pt's pain the most.   LE MMT: LE Myotomal screen negative   MMT Right 10/10/2021 Left 10/10/2021  Hip flexion      Hip extension      Hip abduction      Hip adduction      Hip internal rotation      Hip external rotation      Knee flexion      Knee extension      Ankle dorsiflexion      Ankle plantarflexion      Ankle inversion      Ankle eversion       (Blank rows = not tested)   LUMBAR SPECIAL TESTS:  Slump test: Negative   FUNCTIONAL TESTS:  Complete 5xSTS and 2MWT next session   GAIT: Distance walked: Within clinic Assistive device utilized: None Level of assistance: Complete Independence Comments: Gait pattern Wnls  TODAY'S TREATMENT :  [facility] Adult PT Treatment:                                                DATE: 11/07/21 Therapeutic Exercise: Nu-step 5 mins, L5, UE/LE Seated trunk FF and lateral flexion x5, 5 sec in each direction c theraball Hamstring stretch, seated, x2, 20 sec PPT, 2x10, 3" 90/90 abdominal bracing, x5 10" HL clamshell, x15, GTB, 3" Bridging, x15, 3" Hip add set x15, 3' Shoulder row 2x10 GTB Shoulder ext 2x10 GTB  [facility] Adult PT Treatment:                                                DATE: 11/02/21 Therapeutic Exercise:  Nu-step 5 mins, L5, UE/LE Seated trunk FF and lateral flexion x5, 5 sec in each direction c theraball Hamstring stretch, seated, x2, 20 sec Seated QL stretch, x2, 20 sec Piriformis stretch, x2, 20 sec PPT, 2x10, 3" HL clamshell, 2x10, GTB, 3" Bridging, 2x5, 3" Hip add set, 2x10, 3'  Self Care: Updated HEP    [facility] Adult PT Treatment:  DATE: 10/31/21 Therapeutic  Exercise: Seated trunk FF and lateral flexion x5, 5 sec in each direction c theraball- Pt reported low back felt loser Hamstring stretch, seated, x2, 20 sec Seated QL stretch, x2, 20 sec Piriformis stretch, x2, 20 sec PPT, 2x10, 3" HL clamshell, 2x10, GTB, 3"  Self Care: Updated HEP    PATIENT EDUCATION:  Education details: Eval findings, POC, HEP Person educated: Patient Education method: Explanation, Demonstration, Tactile cues, Verbal cues, and Handouts Education comprehension: verbalized understanding, returned demonstration, verbal cues required, and tactile cues required     HOME EXERCISE PROGRAM: Access Code: [email or web address] URL: [email or web address] Date: 11/02/2021 Prepared by: Gar Ponto  Exercises Seated Flexion Stretch with Swiss Ball - 3-6 x daily - 7 x weekly - 1 sets - 6 reps - 15 hold Supine Piriformis Stretch with Foot on Ground - 2 x daily - 7 x weekly - 1 sets - 3 reps - 20 hold Sidelying Open Book Thoracic Lumbar Rotation and Extension - 2 x daily - 7 x weekly - 1 sets - 10 reps Seated Hamstring Stretch - 2 x daily - 7 x weekly - 1 sets - 3 reps - 20 hold Seated Quadratus Lumborum Stretch in Chair - 2 x daily - 7 x weekly - 1 sets - 3 reps - 20 hold Supine Posterior Pelvic Tilt - 2 x daily - 7 x weekly - 1 sets - 10 reps - 3 hold Hooklying Clamshell with Resistance - 2 x daily - 7 x weekly - 1 sets - 10 reps - 3 hold Supine Bridge - 2 x daily - 7 x weekly - 1 sets - 10 reps - 3 hold Supine Hip Adduction Isometric with Ball - 2 x daily - 7 x weekly - 1 sets - 10 reps - 3 hold      ASSESSMENT:   CLINICAL IMPRESSION PT was completed for lumbopelvic flexibility and strengthening. Pt tolerated progression with her core and hip strengthening. Pt's verbal report indicates improved pain and function tolerance. Pt is making progress re: pain and function. Pt tolerated PT today without adverse effects     Objective Impairments: Decreased activity  tolerance, decreased ROM, postural dysfunction, obesity, and pain.     GOALS:     SHORT TERM GOALS:   STG Name Target Date Goal status  1 Pt will be ind in an initial HEP Baseline:  10/31/21 MET  2 Pt will voice understanding of measures to assist with decreasing and managing LB and R LE pain  Baseline:  10/31/21 MET    LONG TERM GOALS:    LTG Name Target Date Goal status  1 Pt will be Ind in a final HEP to maintain achieved LOF  Baseline: 12/01/21 INITIAL  2 Increase pt's trunk ROM for flex, L and R lat flexion to min limitation for improved trunk function Baseline: mod limitation 12/01/21 INITIAL  3 Pt will report a decrease in LBP and R LE pain to 2/10 or less intermittently with daily activities and work related activities as pt returns to work Baseline: 12/01/21 INITIAL  4 Pt will be able to pick of her 69 yo child without increase in pain above 2/10 Baseline:unable 12/01/21 INITIAL  5 Pt will be able to sleep in her bed without increase in pain above 2/10 Baseline: sleeps in recliner 12/01/21 INITIAL    PLAN: PT FREQUENCY: 2x/week   PT DURATION: 6 weeks   PLANNED INTERVENTIONS: Therapeutic exercises, Therapeutic activity, Neuro Muscular re-education, Patient/Family education, Joint mobilization, Dry  Needling, Electrical stimulation, Spinal mobilization, Cryotherapy, Moist heat, Taping, Traction, Ionotophoresis 5m/ml Dexamethasone, and Manual therapy   PLAN FOR NEXT SESSION: Continue core tasks and activation strategies, stretches and functional activities to relieve low back pain as well as promote flexibility. Use of modalities and manual therapy as indicated.   Ariyonna Twichell MS, PT 11/07/21 5:55 PM

## 2021-11-09 ENCOUNTER — Ambulatory Visit: Payer: Managed Care, Other (non HMO)

## 2021-11-14 ENCOUNTER — Other Ambulatory Visit: Payer: Self-pay

## 2021-11-14 ENCOUNTER — Ambulatory Visit: Payer: Managed Care, Other (non HMO)

## 2021-11-14 DIAGNOSIS — M5386 Other specified dorsopathies, lumbar region: Secondary | ICD-10-CM

## 2021-11-14 DIAGNOSIS — M5441 Lumbago with sciatica, right side: Secondary | ICD-10-CM

## 2021-11-14 NOTE — Therapy (Signed)
OUTPATIENT PHYSICAL THERAPY TREATMENT NOTE   Patient Name: Amy Kim MRN: [id number] DOB:1986/09/20, 36 y.o., female Today's Date: 11/14/2021  PCP: Camillia Herter, NP REFERRING PROVIDER: Camillia Herter, NP   PT End of Session - 11/14/21 1559     Visit Number 7    Number of Visits 13    Date for PT Re-Evaluation 12/01/21    Authorization Type M[employer]    Progress Note Due on Visit 10    PT Start Time 7672    PT Stop Time 0947    PT Time Calculation (min) 44 min    Activity Tolerance Patient tolerated treatment well    Behavior During Therapy WFL for tasks assessed/performed                  Past Medical History:  Diagnosis Date   Back pain    DDD   Diabetes ([place])    History of migraine    last one 2 days ago   IDA (iron deficiency anemia)    Pregnancy induced hypertension    Urinary frequency    Past Surgical History:  Procedure Laterality Date   CESAREAN SECTION     x 4 total   CESAREAN SECTION N/A 08/11/2015   Procedure: REPEAT CESAREAN SECTION;  Surgeon: Truett Mainland, DO;  Location: [facility];  Service: Obstetrics;  Laterality: N/A;   CESAREAN SECTION N/A 09/19/2018   Procedure: CESAREAN SECTION;  Surgeon: Osborne Oman, MD;  Location: [place];  Service: Obstetrics;  Laterality: N/A;   CHOLECYSTECTOMY N/A 03/21/2016   Procedure: LAPAROSCOPIC CHOLECYSTECTOMY;  Surgeon: Ralene Ok, MD;  Location: [place];  Service: General;  Laterality: N/A;   Patient Active Problem List   Diagnosis Date Noted   Iron deficiency anemia due to chronic blood loss 05/30/2021   Class II obesity 08/26/2020   New onset type 2 diabetes mellitus ([place]) 01/13/2020   Thrombocytosis 01/13/2020   Essential hypertension 12/17/2019   Refusal of blood transfusions as patient is Jehovah's Witness 09/18/2018   Abdominal cyst 03/29/2015   History of C-section 03/24/2015    REFERRING DIAG: Acute low back pain, unspecified back pain laterality, unspecified  whether sciatica present  THERAPY DIAG:  Decreased ROM of lumbar spine  Acute bilateral low back pain with right-sided sciatica  PERTINENT HISTORY: Diabetes, High BMI  PRECAUTIONS: None  SUBJECTIVE: Pt reports her R leg sciatica is bothering today. It started when she was walking her dog earlier today for an hour. Prior to walking she was experiencing low back pain at approx. Pt notes she has decreased the amount time she spends cleaning her home to help reduce the strain on her low back.  PAIN:  Are you having pain? Yes NPRS scale: 9/10 R LB and posterior LE pain to the  Pain location: low back Pain orientation: Right  PAIN TYPE: aching and throbbing Pain description: intermittent  Aggravating factors: cleaning, bending Relieving factors: Rest, heat sitting up straight    OBJECTIVE:    DIAGNOSTIC FINDINGS:   IMPRESSION:MRI Lumbar 11/04/21 1. Moderate right-sided facet arthrosis at L5-S1 with associated reactive marrow edema. Finding could serve as a source for lower right lower back pain and/or right-sided radicular symptoms. 2. Mild degenerative disc disease at L3-4 without stenosis or impingement.   Xray  Lumbar 09/07/21: FINDINGS: There is no evidence of lumbar spine fracture. Alignment is normal. Intervertebral disc spaces are maintained.   IMPRESSION: Negative.     SCREENING FOR RED FLAGS: Bowel or bladder incontinence:  No Cauda equina syndrome: No   COGNITION:          Overall cognitive status: Within functional limits for tasks assessed                        SENSATION:          Light touch: Appears intact             MUSCLE LENGTH: Hamstrings: Right WNLs deg, SLR neg with min increase in pain; Left WNLs deg Marcello Moores test: Right TBA deg; Left TBA deg   POSTURE:  FH c CT step off, rounded shoulders, increased lordosis   PALPATION: TTP with min. Pressure to the R paraspinals and upper gluteal region   LUMBAR AROM   AROM A/PROM  10/10/2021   Flexion Mod limited, 16" from floor. Provocation of R LBP  Extension Min limitated. Provocation of R LBP  Right lateral flexion Mod limited, 18" from from floor. Provocation of R LBP  Left lateral flexion Mod limited, 18" from floor. Provocation of R LBP  Right rotation Full. Provocation of R LBP  Left rotation Full. No povocation of pain   (Blank rows = not tested) Back ext and R latve flex increased pt's pain the most.   LE MMT: LE Myotomal screen negative   MMT Right 10/10/2021 Left 10/10/2021  Hip flexion      Hip extension      Hip abduction      Hip adduction      Hip internal rotation      Hip external rotation      Knee flexion      Knee extension      Ankle dorsiflexion      Ankle plantarflexion      Ankle inversion      Ankle eversion       (Blank rows = not tested)   LUMBAR SPECIAL TESTS:  Slump test: Negative   FUNCTIONAL TESTS:  Complete 5xSTS and 2MWT next session   GAIT: Distance walked: Within clinic Assistive device utilized: None Level of assistance: Complete Independence Comments: Gait pattern Wnls  TODAY'S TREATMENT : [facility] Adult PT Treatment:                                                DATE: 11/14/21 Therapeutic Exercise: Nu-step 5 mins, L5, UE/LE Standing lumbar ext with wall, x10-aggravated pt's low back pain Seated trunk FF and lateral flexion x5, 5 sec in each direction c theraball Hamstring stretch, seated, x2, 20 sec PPT, 2x10, 3" 90/90 abdominal bracing, x5 10" HL clamshell, x15, GTB, 3" Bridging, x15, 3" Hip add set x15, 3' Positioning was completed for supine with legs on bolster for 5 mins. Pt reported this was c comfortable position for her  Self care: Pt is to complete supine with LE elevated at home for pain management. Pt is to return to her HEP when her R LB and LE pain has decreased.    [facility] Adult PT Treatment:                                                DATE: 11/07/21 Therapeutic Exercise: Nu-step 5 mins, L5,  UE/LE Seated trunk  FF and lateral flexion x5, 5 sec in each direction c theraball Hamstring stretch, seated, x2, 20 sec PPT, 2x10, 3" 90/90 abdominal bracing, x5 10" HL clamshell, x15, GTB, 3" Bridging, x15, 3" Hip add set x15, 3' Shoulder row 2x10 GTB Shoulder ext 2x10 GTB  [facility] Adult PT Treatment:                                                DATE: 11/02/21 Therapeutic Exercise:  Nu-step 5 mins, L5, UE/LE Seated trunk FF and lateral flexion x5, 5 sec in each direction c theraball Hamstring stretch, seated, x2, 20 sec Seated QL stretch, x2, 20 sec Piriformis stretch, x2, 20 sec PPT, 2x10, 3" HL clamshell, 2x10, GTB, 3" Bridging, 2x5, 3" Hip add set, 2x10, 3'  Self Care: Updated HEP     PATIENT EDUCATION:  Education details: Eval findings, POC, HEP Person educated: Patient Education method: Explanation, Demonstration, Tactile cues, Verbal cues, and Handouts Education comprehension: verbalized understanding, returned demonstration, verbal cues required, and tactile cues required     HOME EXERCISE PROGRAM: Access Code: [email or web address] URL: [email or web address] Date: 11/02/2021 Prepared by: Gar Ponto  Exercises Seated Flexion Stretch with Swiss Ball - 3-6 x daily - 7 x weekly - 1 sets - 6 reps - 15 hold Supine Piriformis Stretch with Foot on Ground - 2 x daily - 7 x weekly - 1 sets - 3 reps - 20 hold Sidelying Open Book Thoracic Lumbar Rotation and Extension - 2 x daily - 7 x weekly - 1 sets - 10 reps Seated Hamstring Stretch - 2 x daily - 7 x weekly - 1 sets - 3 reps - 20 hold Seated Quadratus Lumborum Stretch in Chair - 2 x daily - 7 x weekly - 1 sets - 3 reps - 20 hold Supine Posterior Pelvic Tilt - 2 x daily - 7 x weekly - 1 sets - 10 reps - 3 hold Hooklying Clamshell with Resistance - 2 x daily - 7 x weekly - 1 sets - 10 reps - 3 hold Supine Bridge - 2 x daily - 7 x weekly - 1 sets - 10 reps - 3 hold Supine Hip Adduction Isometric with Ball - 2 x daily - 7  x weekly - 1 sets - 10 reps - 3 hold      ASSESSMENT:   CLINICAL IMPRESSION Pt presents to PT with increased R LE pain after an extended walk, 1 hour with her dog. Pt was able to complete therex, but without relief. Standing low back ext aggravated pt's low back pain. Supine with legs elevated was a comfortable position for the pt. See pt education. Will continue skilled PT to address pt's pain, strength and mobility deficits to improve pt's function with less pain.     Objective Impairments: Decreased activity tolerance, decreased ROM, postural dysfunction, obesity, and pain.     GOALS:     SHORT TERM GOALS:   STG Name Target Date Goal status  1 Pt will be ind in an initial HEP Baseline:  10/31/21 MET  2 Pt will voice understanding of measures to assist with decreasing and managing LB and R LE pain  Baseline:  10/31/21 MET    LONG TERM GOALS:    LTG Name Target Date Goal status  1 Pt will be Ind in a final HEP to maintain achieved LOF  Baseline: 12/01/21 INITIAL  2 Increase pt's trunk ROM for flex, L and R lat flexion to min limitation for improved trunk function Baseline: mod limitation 12/01/21 INITIAL  3 Pt will report a decrease in LBP and R LE pain to 2/10 or less intermittently with daily activities and work related activities as pt returns to work Baseline: 12/01/21 INITIAL  4 Pt will be able to pick of her 1 yo child without increase in pain above 2/10 Baseline:unable 12/01/21 INITIAL  5 Pt will be able to sleep in her bed without increase in pain above 2/10 Baseline: sleeps in recliner 12/01/21 INITIAL    PLAN: PT FREQUENCY: 2x/week   PT DURATION: 6 weeks   PLANNED INTERVENTIONS: Therapeutic exercises, Therapeutic activity, Neuro Muscular re-education, Patient/Family education, Joint mobilization, Dry Needling, Electrical stimulation, Spinal mobilization, Cryotherapy, Moist heat, Taping, Traction, Ionotophoresis 54m/ml Dexamethasone, and Manual therapy   PLAN FOR NEXT  SESSION: Continue core tasks and activation strategies, stretches and functional activities to relieve low back pain as well as promote flexibility. Use of modalities and manual therapy as indicated.   Areal Cochrane MS, PT 11/14/21 6:04 PM

## 2021-11-15 NOTE — Therapy (Incomplete)
OUTPATIENT PHYSICAL THERAPY TREATMENT NOTE   Patient Name: Amy Kim MRN: [id number] DOB:17-Jul-1986, 36 y.o., female Today's Date: 11/15/2021  PCP: Camillia Herter, NP REFERRING PROVIDER: Camillia Herter, NP          Past Medical History:  Diagnosis Date   Back pain    DDD   Diabetes [facility])    History of migraine    last one 2 days ago   IDA (iron deficiency anemia)    Pregnancy induced hypertension    Urinary frequency    Past Surgical History:  Procedure Laterality Date   CESAREAN SECTION     x 4 total   CESAREAN SECTION N/A 08/11/2015   Procedure: REPEAT CESAREAN SECTION;  Surgeon: Truett Mainland, DO;  Location: [facility];  Service: Obstetrics;  Laterality: N/A;   CESAREAN SECTION N/A 09/19/2018   Procedure: CESAREAN SECTION;  Surgeon: Osborne Oman, MD;  Location: [place];  Service: Obstetrics;  Laterality: N/A;   CHOLECYSTECTOMY N/A 03/21/2016   Procedure: LAPAROSCOPIC CHOLECYSTECTOMY;  Surgeon: Ralene Ok, MD;  Location: [place];  Service: General;  Laterality: N/A;   Patient Active Problem List   Diagnosis Date Noted   Iron deficiency anemia due to chronic blood loss 05/30/2021   Class II obesity 08/26/2020   New onset type 2 diabetes mellitus ([place]) 01/13/2020   Thrombocytosis 01/13/2020   Essential hypertension 12/17/2019   Refusal of blood transfusions as patient is Jehovah's Witness 09/18/2018   Abdominal cyst 03/29/2015   History of C-section 03/24/2015    REFERRING DIAG: Acute low back pain, unspecified back pain laterality, unspecified whether sciatica present  THERAPY DIAG:  No diagnosis found.  PERTINENT HISTORY: Diabetes, High BMI  PRECAUTIONS: None  SUBJECTIVE: Pt reports her R leg sciatica is bothering today. It started when she was walking her dog earlier today for an hour. Prior to walking she was experiencing low back pain at approx. Pt notes she has decreased the amount time she spends cleaning her home to help  reduce the strain on her low back.  PAIN:  Are you having pain? Yes NPRS scale: 9/10 R LB and posterior LE pain to the  Pain location: low back Pain orientation: Right  PAIN TYPE: aching and throbbing Pain description: intermittent  Aggravating factors: cleaning, bending Relieving factors: Rest, heat sitting up straight    OBJECTIVE:    DIAGNOSTIC FINDINGS:   IMPRESSION:MRI Lumbar 11/04/21 1. Moderate right-sided facet arthrosis at L5-S1 with associated reactive marrow edema. Finding could serve as a source for lower right lower back pain and/or right-sided radicular symptoms. 2. Mild degenerative disc disease at L3-4 without stenosis or impingement.   Xray  Lumbar 09/07/21: FINDINGS: There is no evidence of lumbar spine fracture. Alignment is normal. Intervertebral disc spaces are maintained.   IMPRESSION: Negative.     SCREENING FOR RED FLAGS: Bowel or bladder incontinence: No Cauda equina syndrome: No   COGNITION:          Overall cognitive status: Within functional limits for tasks assessed                        SENSATION:          Light touch: Appears intact             MUSCLE LENGTH: Hamstrings: Right WNLs deg, SLR neg with min increase in pain; Left WNLs deg Marcello Moores test: Right TBA deg; Left TBA deg   POSTURE:  FH c CT step  off, rounded shoulders, increased lordosis   PALPATION: TTP with min. Pressure to the R paraspinals and upper gluteal region   LUMBAR AROM   AROM A/PROM  10/10/2021  Flexion Mod limited, 16" from floor. Provocation of R LBP  Extension Min limitated. Provocation of R LBP  Right lateral flexion Mod limited, 18" from from floor. Provocation of R LBP  Left lateral flexion Mod limited, 18" from floor. Provocation of R LBP  Right rotation Full. Provocation of R LBP  Left rotation Full. No povocation of pain   (Blank rows = not tested) Back ext and R latve flex increased pt's pain the most.   LE MMT: LE Myotomal screen negative    MMT Right 10/10/2021 Left 10/10/2021  Hip flexion      Hip extension      Hip abduction      Hip adduction      Hip internal rotation      Hip external rotation      Knee flexion      Knee extension      Ankle dorsiflexion      Ankle plantarflexion      Ankle inversion      Ankle eversion       (Blank rows = not tested)   LUMBAR SPECIAL TESTS:  Slump test: Negative   FUNCTIONAL TESTS:  Complete 5xSTS and 2MWT next session   GAIT: Distance walked: Within clinic Assistive device utilized: None Level of assistance: Complete Independence Comments: Gait pattern Wnls  TODAY'S TREATMENT : [facility] Adult PT Treatment:                                                DATE: 11/16/21 Therapeutic Exercise: *** Manual Therapy: *** Neuromuscular re-ed: *** Therapeutic Activity: *** Modalities: *** Self Care: ***    [facility] Adult PT Treatment:                                                DATE: 11/14/21 Therapeutic Exercise: Nu-step 5 mins, L5, UE/LE Standing lumbar ext with wall, x10-aggravated pt's low back pain Seated trunk FF and lateral flexion x5, 5 sec in each direction c theraball Hamstring stretch, seated, x2, 20 sec PPT, 2x10, 3" 90/90 abdominal bracing, x5 10" HL clamshell, x15, GTB, 3" Bridging, x15, 3" Hip add set x15, 3' Positioning was completed for supine with legs on bolster for 5 mins. Pt reported this was c comfortable position for her  Self care: Pt is to complete supine with LE elevated at home for pain management. Pt is to return to her HEP when her R LB and LE pain has decreased.    [facility] Adult PT Treatment:                                                DATE: 11/07/21 Therapeutic Exercise: Nu-step 5 mins, L5, UE/LE Seated trunk FF and lateral flexion x5, 5 sec in each direction c theraball Hamstring stretch, seated, x2, 20 sec PPT, 2x10, 3" 90/90 abdominal bracing, x5 10" HL clamshell, x15, GTB, 3" Bridging, x15, 3" Hip  add set x15, 3' Shoulder  row 2x10 GTB Shoulder ext 2x10 GTB    PATIENT EDUCATION:  Education details: Eval findings, POC, HEP Person educated: Patient Education method: Explanation, Demonstration, Tactile cues, Verbal cues, and Handouts Education comprehension: verbalized understanding, returned demonstration, verbal cues required, and tactile cues required     HOME EXERCISE PROGRAM: Access Code: [email or web address] URL: [email or web address] Date: 11/02/2021 Prepared by: Gar Ponto  Exercises Seated Flexion Stretch with Swiss Ball - 3-6 x daily - 7 x weekly - 1 sets - 6 reps - 15 hold Supine Piriformis Stretch with Foot on Ground - 2 x daily - 7 x weekly - 1 sets - 3 reps - 20 hold Sidelying Open Book Thoracic Lumbar Rotation and Extension - 2 x daily - 7 x weekly - 1 sets - 10 reps Seated Hamstring Stretch - 2 x daily - 7 x weekly - 1 sets - 3 reps - 20 hold Seated Quadratus Lumborum Stretch in Chair - 2 x daily - 7 x weekly - 1 sets - 3 reps - 20 hold Supine Posterior Pelvic Tilt - 2 x daily - 7 x weekly - 1 sets - 10 reps - 3 hold Hooklying Clamshell with Resistance - 2 x daily - 7 x weekly - 1 sets - 10 reps - 3 hold Supine Bridge - 2 x daily - 7 x weekly - 1 sets - 10 reps - 3 hold Supine Hip Adduction Isometric with Ball - 2 x daily - 7 x weekly - 1 sets - 10 reps - 3 hold      ASSESSMENT:   CLINICAL IMPRESSION Pt presents to PT with increased R LE pain after an extended walk, 1 hour with her dog. Pt was able to complete therex, but without relief. Standing low back ext aggravated pt's low back pain. Supine with legs elevated was a comfortable position for the pt. See pt education. Will continue skilled PT to address pt's pain, strength and mobility deficits to improve pt's function with less pain.     Objective Impairments: Decreased activity tolerance, decreased ROM, postural dysfunction, obesity, and pain.     GOALS:     SHORT TERM GOALS:   STG Name Target Date Goal status  1 Pt will  be ind in an initial HEP Baseline:  10/31/21 MET  2 Pt will voice understanding of measures to assist with decreasing and managing LB and R LE pain  Baseline:  10/31/21 MET    LONG TERM GOALS:    LTG Name Target Date Goal status  1 Pt will be Ind in a final HEP to maintain achieved LOF  Baseline: 12/01/21 INITIAL  2 Increase pt's trunk ROM for flex, L and R lat flexion to min limitation for improved trunk function Baseline: mod limitation 12/01/21 INITIAL  3 Pt will report a decrease in LBP and R LE pain to 2/10 or less intermittently with daily activities and work related activities as pt returns to work Baseline: 12/01/21 INITIAL  4 Pt will be able to pick of her 52 yo child without increase in pain above 2/10 Baseline:unable 12/01/21 INITIAL  5 Pt will be able to sleep in her bed without increase in pain above 2/10 Baseline: sleeps in recliner 12/01/21 INITIAL    PLAN: PT FREQUENCY: 2x/week   PT DURATION: 6 weeks   PLANNED INTERVENTIONS: Therapeutic exercises, Therapeutic activity, Neuro Muscular re-education, Patient/Family education, Joint mobilization, Dry Needling, Electrical stimulation, Spinal mobilization, Cryotherapy, Moist heat, Taping, Traction, Ionotophoresis 22m/ml Dexamethasone, and  Manual therapy   PLAN FOR NEXT SESSION: Continue core tasks and activation strategies, stretches and functional activities to relieve low back pain as well as promote flexibility. Use of modalities and manual therapy as indicated.   Libni Fusaro MS, PT 11/15/21 8:47 PM

## 2021-11-16 ENCOUNTER — Other Ambulatory Visit: Payer: Self-pay

## 2021-11-16 ENCOUNTER — Ambulatory Visit: Payer: Managed Care, Other (non HMO)

## 2021-11-16 ENCOUNTER — Ambulatory Visit (INDEPENDENT_AMBULATORY_CARE_PROVIDER_SITE_OTHER): Payer: Self-pay

## 2021-11-16 ENCOUNTER — Ambulatory Visit (INDEPENDENT_AMBULATORY_CARE_PROVIDER_SITE_OTHER): Payer: 59 | Admitting: Specialist

## 2021-11-16 ENCOUNTER — Encounter: Payer: Self-pay | Admitting: Specialist

## 2021-11-16 VITALS — BP 140/92 | HR 92 | Ht 61.0 in | Wt 198.0 lb

## 2021-11-16 DIAGNOSIS — M47819 Spondylosis without myelopathy or radiculopathy, site unspecified: Secondary | ICD-10-CM

## 2021-11-16 DIAGNOSIS — M5416 Radiculopathy, lumbar region: Secondary | ICD-10-CM

## 2021-11-16 MED ORDER — GABAPENTIN 100 MG PO CAPS: 100.0000 mg | ORAL_CAPSULE | Freq: Every day | ORAL | 2 refills | Status: DC
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
## 2021-11-20 ENCOUNTER — Other Ambulatory Visit: Payer: Managed Care, Other (non HMO)

## 2021-11-20 ENCOUNTER — Other Ambulatory Visit: Payer: Self-pay | Admitting: Family

## 2021-11-20 DIAGNOSIS — E119 Type 2 diabetes mellitus without complications: Secondary | ICD-10-CM

## 2021-11-20 DIAGNOSIS — A048 Other specified bacterial intestinal infections: Secondary | ICD-10-CM

## 2021-11-21 ENCOUNTER — Ambulatory Visit: Payer: Managed Care, Other (non HMO)

## 2021-11-21 LAB — HELICOBACTER PYLORI  SPECIAL ANTIGEN
MICRO NUMBER:: 13061298
SPECIMEN QUALITY: ADEQUATE

## 2021-11-21 MED ORDER — METFORMIN HCL 500 MG PO TABS: 500.0000 mg | ORAL_TABLET | Freq: Two times a day (BID) | ORAL | 0 refills | Status: DC

## 2021-11-22 NOTE — Therapy (Addendum)
OUTPATIENT PHYSICAL THERAPY TREATMENT NOTE/Discharge   Patient Name: Amy Kim MRN: [id number] DOB:09/20/1986, 36 y.o., female Today's Date: 11/23/2021  PCP: Camillia Herter, NP REFERRING PROVIDER: Camillia Herter, NP   PT End of Session - 11/23/21 1557     Visit Number 7   pt needed to leave near start of session due to an emergency   Number of Visits 13    Date for PT Re-Evaluation 12/01/21    Authorization Type MCD/[employer]    Progress Note Due on Visit 10    PT Start Time 1552    PT Stop Time 1600    PT Time Calculation (min) 8 min    Activity Tolerance Patient tolerated treatment well    Behavior During Therapy WFL for tasks assessed/performed                   Past Medical History:  Diagnosis Date   Back pain    DDD   Diabetes ([place])    History of migraine    last one 2 days ago   IDA (iron deficiency anemia)    Pregnancy induced hypertension    Urinary frequency    Past Surgical History:  Procedure Laterality Date   CESAREAN SECTION     x 4 total   CESAREAN SECTION N/A 08/11/2015   Procedure: REPEAT CESAREAN SECTION;  Surgeon: Truett Mainland, DO;  Location: [facility];  Service: Obstetrics;  Laterality: N/A;   CESAREAN SECTION N/A 09/19/2018   Procedure: CESAREAN SECTION;  Surgeon: Osborne Oman, MD;  Location: [facility];  Service: Obstetrics;  Laterality: N/A;   CHOLECYSTECTOMY N/A 03/21/2016   Procedure: LAPAROSCOPIC CHOLECYSTECTOMY;  Surgeon: Ralene Ok, MD;  Location: [facility];  Service: General;  Laterality: N/A;   Patient Active Problem List   Diagnosis Date Noted   Iron deficiency anemia due to chronic blood loss 05/30/2021   Class II obesity 08/26/2020   New onset type 2 diabetes mellitus ([place]) 01/13/2020   Thrombocytosis 01/13/2020   Essential hypertension 12/17/2019   Refusal of blood transfusions as patient is Jehovah's Witness 09/18/2018   Abdominal cyst 03/29/2015   History of C-section 03/24/2015    REFERRING DIAG:  Acute low back pain, unspecified back pain laterality, unspecified whether sciatica present  THERAPY DIAG:  Decreased ROM of lumbar spine  Acute bilateral low back pain with right-sided sciatica  PERTINENT HISTORY: Diabetes, High BMI  PRECAUTIONS: None  SUBJECTIVE: Pe notes she has returned to work at a new job. She doesn't have to lift, but does have to stand while using a computer. Pt reports seeing Dr. Louanne Skye and was told she has fluid around the spine of her low back, a bulging disc and arthritis.  PAIN:  Are you having pain? Yes NPRS scale: 5/10 R LB and posterior LE pain to the  Pain location: low back Pain orientation: Right  PAIN TYPE: aching and throbbing Pain description: intermittent  Aggravating factors: cleaning, bending Relieving factors: Rest, heat sitting up straight    OBJECTIVE:    DIAGNOSTIC FINDINGS:   IMPRESSION:MRI Lumbar 11/04/21 1. Moderate right-sided facet arthrosis at L5-S1 with associated reactive marrow edema. Finding could serve as a source for lower right lower back pain and/or right-sided radicular symptoms. 2. Mild degenerative disc disease at L3-4 without stenosis or impingement.   Xray  Lumbar 09/07/21: FINDINGS: There is no evidence of lumbar spine fracture. Alignment is normal. Intervertebral disc spaces are maintained.   IMPRESSION: Negative.     SCREENING  FOR RED FLAGS: Bowel or bladder incontinence: No Cauda equina syndrome: No   COGNITION:          Overall cognitive status: Within functional limits for tasks assessed                        SENSATION:          Light touch: Appears intact             MUSCLE LENGTH: Hamstrings: Right WNLs deg, SLR neg with min increase in pain; Left WNLs deg Marcello Moores test: Right TBA deg; Left TBA deg   POSTURE:  FH c CT step off, rounded shoulders, increased lordosis   PALPATION: TTP with min. Pressure to the R paraspinals and upper gluteal region   LUMBAR AROM   AROM A/PROM   10/10/2021  Flexion Mod limited, 16" from floor. Provocation of R LBP  Extension Min limitated. Provocation of R LBP  Right lateral flexion Mod limited, 18" from from floor. Provocation of R LBP  Left lateral flexion Mod limited, 18" from floor. Provocation of R LBP  Right rotation Full. Provocation of R LBP  Left rotation Full. No povocation of pain   (Blank rows = not tested) Back ext and R latve flex increased pt's pain the most.   LE MMT: LE Myotomal screen negative   MMT Right 10/10/2021 Left 10/10/2021  Hip flexion      Hip extension      Hip abduction      Hip adduction      Hip internal rotation      Hip external rotation      Knee flexion      Knee extension      Ankle dorsiflexion      Ankle plantarflexion      Ankle inversion      Ankle eversion       (Blank rows = not tested)   LUMBAR SPECIAL TESTS:  Slump test: Negative   FUNCTIONAL TESTS:  Complete 5xSTS and 2MWT next session   GAIT: Distance walked: Within clinic Assistive device utilized: None Level of assistance: Complete Independence Comments: Gait pattern Wnls  TODAY'S TREATMENT : [facility] Adult PT Treatment:                                                DATE: 11/23/21 Therapeutic Exercise: Nu-step 5 mins, L5, UE/LE   [facility] Adult PT Treatment:                                                DATE: 11/14/21 Therapeutic Exercise: Nu-step 5 mins, L5, UE/LE Standing lumbar ext with wall, x10-aggravated pt's low back pain Seated trunk FF and lateral flexion x5, 5 sec in each direction c theraball Hamstring stretch, seated, x2, 20 sec PPT, 2x10, 3" 90/90 abdominal bracing, x5 10" HL clamshell, x15, GTB, 3" Bridging, x15, 3" Hip add set x15, 3' Positioning was completed for supine with legs on bolster for 5 mins. Pt reported this was c comfortable position for her  Self care: Pt is to complete supine with LE elevated at home for pain management. Pt is to return to her HEP when her R LB and LE  pain has  decreased.    [facility] Adult PT Treatment:                                                DATE: 11/07/21 Therapeutic Exercise: Nu-step 5 mins, L5, UE/LE Seated trunk FF and lateral flexion x5, 5 sec in each direction c theraball Hamstring stretch, seated, x2, 20 sec PPT, 2x10, 3" 90/90 abdominal bracing, x5 10" HL clamshell, x15, GTB, 3" Bridging, x15, 3" Hip add set x15, 3' Shoulder row 2x10 GTB Shoulder ext 2x10 GTB    PATIENT EDUCATION:  Education details: Eval findings, POC, HEP Person educated: Patient Education method: Explanation, Demonstration, Tactile cues, Verbal cues, and Handouts Education comprehension: verbalized understanding, returned demonstration, verbal cues required, and tactile cues required     HOME EXERCISE PROGRAM: Access Code: [email or web address] URL: [email or web address] Date: 11/02/2021 Prepared by: Gar Ponto  Exercises Seated Flexion Stretch with Swiss Ball - 3-6 x daily - 7 x weekly - 1 sets - 6 reps - 15 hold Supine Piriformis Stretch with Foot on Ground - 2 x daily - 7 x weekly - 1 sets - 3 reps - 20 hold Sidelying Open Book Thoracic Lumbar Rotation and Extension - 2 x daily - 7 x weekly - 1 sets - 10 reps Seated Hamstring Stretch - 2 x daily - 7 x weekly - 1 sets - 3 reps - 20 hold Seated Quadratus Lumborum Stretch in Chair - 2 x daily - 7 x weekly - 1 sets - 3 reps - 20 hold Supine Posterior Pelvic Tilt - 2 x daily - 7 x weekly - 1 sets - 10 reps - 3 hold Hooklying Clamshell with Resistance - 2 x daily - 7 x weekly - 1 sets - 10 reps - 3 hold Supine Bridge - 2 x daily - 7 x weekly - 1 sets - 10 reps - 3 hold Supine Hip Adduction Isometric with Ball - 2 x daily - 7 x weekly - 1 sets - 10 reps - 3 hold      ASSESSMENT:   CLINICAL IMPRESSION Pt received an emergency phone call during the session and needed to leave. No other services were provided today other than exercise on the [place].    Objective Impairments: Decreased activity  tolerance, decreased ROM, postural dysfunction, obesity, and pain.     GOALS:     SHORT TERM GOALS:   STG Name Target Date Goal status  1 Pt will be ind in an initial HEP Baseline:  10/31/21 MET  2 Pt will voice understanding of measures to assist with decreasing and managing LB and R LE pain  Baseline:  10/31/21 MET    LONG TERM GOALS:    LTG Name Target Date Goal status  1 Pt will be Ind in a final HEP to maintain achieved LOF  Baseline: 12/01/21 INITIAL  2 Increase pt's trunk ROM for flex, L and R lat flexion to min limitation for improved trunk function Baseline: mod limitation 12/01/21 INITIAL  3 Pt will report a decrease in LBP and R LE pain to 2/10 or less intermittently with daily activities and work related activities as pt returns to work Baseline: 12/01/21 INITIAL  4 Pt will be able to pick of her 48 yo child without increase in pain above 2/10 Baseline:unable 12/01/21 INITIAL  5 Pt will be able to sleep  in her bed without increase in pain above 2/10 Baseline: sleeps in recliner 12/01/21 INITIAL    PLAN: PT FREQUENCY: 2x/week   PT DURATION: 6 weeks   PLANNED INTERVENTIONS: Therapeutic exercises, Therapeutic activity, Neuro Muscular re-education, Patient/Family education, Joint mobilization, Dry Needling, Electrical stimulation, Spinal mobilization, Cryotherapy, Moist heat, Taping, Traction, Ionotophoresis 28m/ml Dexamethasone, and Manual therapy   PLAN FOR NEXT SESSION: Continue core tasks and activation strategies, stretches and functional activities to relieve low back pain as well as promote flexibility. Use of modalities and manual therapy as indicated.   Teshia Mahone MS, PT 11/23/21 4:20 PM  PHYSICAL THERAPY DISCHARGE SUMMARY  Visits from Start of Care: 7  Current functional level related to goals / functional outcomes: unknown   Remaining deficits: unknown   Education / Equipment: HEP   Patient agrees to discharge. Patient goals were partially met. Patient  is being discharged due to not returning since the last visit.  Matison Nuccio MS, PT 08/21/22 4:19 PM

## 2021-11-23 ENCOUNTER — Other Ambulatory Visit: Payer: Self-pay

## 2021-11-23 ENCOUNTER — Ambulatory Visit: Payer: Managed Care, Other (non HMO) | Attending: Family

## 2021-11-23 DIAGNOSIS — M5386 Other specified dorsopathies, lumbar region: Secondary | ICD-10-CM | POA: Insufficient documentation

## 2021-11-23 DIAGNOSIS — M5441 Lumbago with sciatica, right side: Secondary | ICD-10-CM | POA: Insufficient documentation

## 2021-11-27 ENCOUNTER — Telehealth: Payer: Self-pay | Admitting: Hematology and Oncology

## 2021-11-27 NOTE — Telephone Encounter (Signed)
R/s per 3/6 in basket, message has been left ?

## 2021-11-28 ENCOUNTER — Inpatient Hospital Stay: Payer: No Typology Code available for payment source | Admitting: Hematology and Oncology

## 2021-11-28 ENCOUNTER — Inpatient Hospital Stay: Payer: No Typology Code available for payment source

## 2021-12-01 ENCOUNTER — Ambulatory Visit: Payer: Managed Care, Other (non HMO)

## 2021-12-11 ENCOUNTER — Inpatient Hospital Stay: Payer: Managed Care, Other (non HMO) | Attending: Family

## 2021-12-11 ENCOUNTER — Inpatient Hospital Stay: Payer: Managed Care, Other (non HMO) | Admitting: Hematology and Oncology

## 2021-12-13 ENCOUNTER — Encounter: Payer: Self-pay | Admitting: Family

## 2021-12-15 ENCOUNTER — Ambulatory Visit: Payer: Self-pay | Admitting: Specialist

## 2022-01-24 ENCOUNTER — Ambulatory Visit: Payer: Managed Care, Other (non HMO) | Admitting: Obstetrics and Gynecology

## 2022-05-02 ENCOUNTER — Other Ambulatory Visit: Payer: Self-pay | Admitting: Family

## 2022-05-02 DIAGNOSIS — I10 Essential (primary) hypertension: Secondary | ICD-10-CM

## 2022-05-02 NOTE — Telephone Encounter (Signed)
Called pt - [facility] for appt.  

## 2022-05-02 NOTE — Telephone Encounter (Signed)
Courtesy refill. Patient will need an office visit for further refills. Requested Prescriptions  Pending Prescriptions Disp Refills  . amLODipine (NORVASC) 5 MG tablet [Pharmacy Med Name: amLODIPine Besylate 5 MG Oral Tablet] 90 tablet 0    Sig: Take 1 tablet by mouth once daily     Cardiovascular: Calcium Channel Blockers 2 Failed - 05/02/2022  4:38 PM      Failed - Last BP in normal range    BP Readings from Last 1 Encounters:  11/16/21 (!) 140/92         Failed - Valid encounter within last 6 months    Recent Outpatient Visits          7 months ago Essential hypertension   Primary Care at [facility], Amy J, NP   10 months ago Type 2 diabetes mellitus without complication, without long-term current use of insulin [facility])   Primary Care at [facility], [place], NP   1 year ago Encounter for Depo-Provera contraception   Primary Care at [facility], Amy J, NP   1 year ago Type 2 diabetes mellitus without complication, without long-term current use of insulin [facility])   Primary Care at [facility], Bayard Beaver, MD   1 year ago New onset type 2 diabetes mellitus [facility])   Primary Care at [facility], MD             Passed - Last Heart Rate in normal range    Pulse Readings from Last 1 Encounters:  11/16/21 92

## 2022-05-03 NOTE — Progress Notes (Signed)
Patient ID: Amy Kim, female    DOB: [birth year]  MRN: [id number]  CC: Chronic Care Management   Subjective: Amy Kim is a 36 y.o. female who presents for chronic care management.   Her concerns today include:  Doing well on current medications without issues or concerns.   Patient Active Problem List   Diagnosis Date Noted   Iron deficiency anemia due to chronic blood loss 05/30/2021   Class II obesity 08/26/2020   New onset type 2 diabetes mellitus ([place]) 01/13/2020   Thrombocytosis 01/13/2020   Essential hypertension 12/17/2019   Refusal of blood transfusions as patient is Jehovah's Witness 09/18/2018   Abdominal cyst 03/29/2015   History of C-section 03/24/2015     Current Outpatient Medications on File Prior to Visit  Medication Sig Dispense Refill   acetaminophen (TYLENOL) 325 MG tablet Take 2 tablets (650 mg total) by mouth every 6 (six) hours as needed. 36 tablet 0   Amoxicill-Rifabutin-Omeprazole (TALICIA) 627-03.5-00 MG CPDR Take as prescribed for 14 days 168 capsule 0   gabapentin (NEURONTIN) 100 MG capsule Take 1 capsule (100 mg total) by mouth at bedtime. 30 capsule 2   medroxyPROGESTERone Acetate 150 MG/ML SUSY Inject 150 mg as directed PRO. Every 90 days     Current Facility-Administered Medications on File Prior to Visit  Medication Dose Route Frequency Provider Last Rate Last Admin   0.9 %  sodium chloride infusion  500 mL Intravenous Once Sharyn Creamer, MD        Allergies  Allergen Reactions   Aspirin    Ibuprofen     Social History   Socioeconomic History   Marital status: Married    Spouse name: Not on file   Number of children: Not on file   Years of education: Not on file   Highest education level: Not on file  Occupational History   Not on file  Tobacco Use   Smoking status: Never   Smokeless tobacco: Never  Vaping Use   Vaping Use: Never used  Substance and Sexual Activity   Alcohol use: Not Currently    Comment:  occaionally   Drug use: No   Sexual activity: Yes    Birth control/protection: Injection    Comment: Medroxyprogesterone   Other Topics Concern   Not on file  Social History Narrative   Not on file   Social Determinants of Health   Financial Resource Strain: Low Risk  (09/11/2018)   Overall Financial Resource Strain (CARDIA)    Difficulty of Paying Living Expenses: Not hard at all  Food Insecurity: No Food Insecurity (09/11/2018)   Hunger Vital Sign    Worried About Running Out of Food in the Last Year: Never true    [place] in the Last Year: Never true  Transportation Needs: No Transportation Needs (09/19/2018)   PRAPARE - [name] (Medical): No    Lack of Transportation (Non-Medical): No  Physical Activity: Not on file  Stress: No Stress Concern Present (09/11/2018)   [place]    Feeling of Stress : Only a little  Social Connections: Not on file  Intimate Partner Violence: Not At Risk (09/11/2018)   Humiliation, Afraid, Rape, and Kick questionnaire    Fear of Current or Ex-Partner: No    Emotionally Abused: No    Physically Abused: No    Sexually Abused: No    Family History  Problem Relation Age  of Onset   Hypertension Mother    Hypertension Father    Heart disease Paternal Grandmother    Colon cancer Neg Hx    Esophageal cancer Neg Hx    Pancreatic cancer Neg Hx    Stomach cancer Neg Hx    Liver disease Neg Hx     Past Surgical History:  Procedure Laterality Date   CESAREAN SECTION     x 4 total   CESAREAN SECTION N/A 08/11/2015   Procedure: REPEAT CESAREAN SECTION;  Surgeon: Truett Mainland, DO;  Location: [facility];  Service: Obstetrics;  Laterality: N/A;   CESAREAN SECTION N/A 09/19/2018   Procedure: CESAREAN SECTION;  Surgeon: Osborne Oman, MD;  Location: [facility];  Service: Obstetrics;  Laterality: N/A;   CHOLECYSTECTOMY N/A  03/21/2016   Procedure: LAPAROSCOPIC CHOLECYSTECTOMY;  Surgeon: Ralene Ok, MD;  Location: [facility];  Service: General;  Laterality: N/A;    ROS: Review of Systems Negative except as stated above  PHYSICAL EXAM: BP 132/79 (BP Location: Right Arm, Patient Position: Sitting, Cuff Size: Large)   Pulse 86   Temp 98.3 F (36.8 C)   Resp 16   Ht 5' 0.98" (1.549 m)   Wt 204 lb (92.5 kg)   SpO2 96%   BMI 38.57 kg/m   Physical Exam HENT:     Head: Normocephalic and atraumatic.  Eyes:     Extraocular Movements: Extraocular movements intact.     Conjunctiva/sclera: Conjunctivae normal.     Pupils: Pupils are equal, round, and reactive to light.  Cardiovascular:     Rate and Rhythm: Normal rate and regular rhythm.     Pulses: Normal pulses.     Heart sounds: Normal heart sounds.  Pulmonary:     Effort: Pulmonary effort is normal.     Breath sounds: Normal breath sounds.  Musculoskeletal:     Cervical back: Normal range of motion and neck supple.  Neurological:     General: No focal deficit present.     Mental Status: She is alert and oriented to person, place, and time.  Psychiatric:        Mood and Affect: Mood normal.        Behavior: Behavior normal.     Results for orders placed or performed in visit on 05/08/22  POCT glycosylated hemoglobin (Hb A1C)  Result Value Ref Range   Hemoglobin A1C 6.8 (A) 4.0 - 5.6 %   HbA1c POC (<> result, manual entry)     HbA1c, POC (prediabetic range)     HbA1c, POC (controlled diabetic range)       ASSESSMENT AND PLAN: 1. Essential (primary) hypertension - Continue Amlodipine as prescribed.  - Counseled on blood pressure goal of less than 130/80, low-sodium, DASH diet, medication compliance, and 150 minutes of moderate intensity exercise per week as tolerated. Counseled on medication adherence and adverse effects. - Update BMP. - Follow-up with primary provider in 6 months or sooner if needed.  - Basic Metabolic Panel -  amLODipine (NORVASC) 5 MG tablet; Take 1 tablet (5 mg total) by mouth daily.  Dispense: 30 tablet; Refill: 5  2. Type 2 diabetes mellitus without complication, without long-term current use of insulin (HCC) - Hemoglobin A1c at goal 6.8%, goal < 7% - Continue Metformin as prescribed.  - Discussed the importance of healthy eating habits, low-carbohydrate diet, low-sugar diet, regular aerobic exercise (at least 150 minutes a week as tolerated) and medication compliance to achieve or maintain control of diabetes. -  Follow-up with primary provider in 6 months or sooner if needed.  - POCT glycosylated hemoglobin (Hb A1C) - metFORMIN (GLUCOPHAGE) 500 MG tablet; Take 1 tablet (500 mg total) by mouth 2 (two) times daily with a meal.  Dispense: 60 tablet; Refill: 5    Patient was given the opportunity to ask questions.  Patient verbalized understanding of the plan and was able to repeat key elements of the plan. Patient was given clear instructions to go to Emergency Department or return to medical center if symptoms don't improve, worsen, or new problems develop.The patient verbalized understanding.   Orders Placed This Encounter  Procedures   Basic Metabolic Panel   POCT glycosylated hemoglobin (Hb A1C)     Requested Prescriptions   Signed Prescriptions Disp Refills   metFORMIN (GLUCOPHAGE) 500 MG tablet 60 tablet 5    Sig: Take 1 tablet (500 mg total) by mouth 2 (two) times daily with a meal.   amLODipine (NORVASC) 5 MG tablet 30 tablet 5    Sig: Take 1 tablet (5 mg total) by mouth daily.    Return in about 6 months (around 11/08/2022) for Follow-Up or next available chronic care mgmt.  Camillia Herter, NP

## 2022-05-08 ENCOUNTER — Ambulatory Visit (INDEPENDENT_AMBULATORY_CARE_PROVIDER_SITE_OTHER): Payer: BC Managed Care – PPO | Admitting: Family

## 2022-05-08 VITALS — BP 132/79 | HR 86 | Temp 98.3°F | Resp 16 | Ht 60.98 in | Wt 204.0 lb

## 2022-05-08 DIAGNOSIS — I10 Essential (primary) hypertension: Secondary | ICD-10-CM

## 2022-05-08 DIAGNOSIS — E119 Type 2 diabetes mellitus without complications: Secondary | ICD-10-CM | POA: Diagnosis not present

## 2022-05-08 LAB — POCT GLYCOSYLATED HEMOGLOBIN (HGB A1C): Hemoglobin A1C: 6.8 % — AB (ref 4.0–5.6)

## 2022-05-08 MED ORDER — AMLODIPINE BESYLATE 5 MG PO TABS: 5.0000 mg | ORAL_TABLET | Freq: Every day | ORAL | 5 refills | Status: DC

## 2022-05-08 MED ORDER — METFORMIN HCL 500 MG PO TABS: 500.0000 mg | ORAL_TABLET | Freq: Two times a day (BID) | ORAL | 5 refills | Status: DC

## 2022-05-08 NOTE — Patient Instructions (Signed)
Hypertension, Adult High blood pressure (hypertension) is when the force of blood pumping through the arteries is too strong. The arteries are the blood vessels that carry blood from the heart throughout the body. Hypertension forces the heart to work harder to pump blood and may cause arteries to become narrow or stiff. Untreated or uncontrolled hypertension can lead to a heart attack, heart failure, a stroke, kidney disease, and other problems. A blood pressure reading consists of a higher number over a lower number. Ideally, your blood pressure should be below 120/80. The first ("top") number is called the systolic pressure. It is a measure of the pressure in your arteries as your heart beats. The second ("bottom") number is called the diastolic pressure. It is a measure of the pressure in your arteries as the heart relaxes. What are the causes? The exact cause of this condition is not known. There are some conditions that result in high blood pressure. What increases the risk? Certain factors may make you more likely to develop high blood pressure. Some of these risk factors are under your control, including: Smoking. Not getting enough exercise or physical activity. Being overweight. Having too much fat, sugar, calories, or salt (sodium) in your diet. Drinking too much alcohol. Other risk factors include: Having a personal history of heart disease, diabetes, high cholesterol, or kidney disease. Stress. Having a family history of high blood pressure and high cholesterol. Having obstructive sleep apnea. Age. The risk increases with age. What are the signs or symptoms? High blood pressure may not cause symptoms. Very high blood pressure (hypertensive crisis) may cause: Headache. Fast or irregular heartbeats (palpitations). Shortness of breath. Nosebleed. Nausea and vomiting. Vision changes. Severe chest pain, dizziness, and seizures. How is this diagnosed? This condition is diagnosed by  measuring your blood pressure while you are seated, with your arm resting on a flat surface, your legs uncrossed, and your feet flat on the floor. The cuff of the blood pressure monitor will be placed directly against the skin of your upper arm at the level of your heart. Blood pressure should be measured at least twice using the same arm. Certain conditions can cause a difference in blood pressure between your right and left arms. If you have a high blood pressure reading during one visit or you have normal blood pressure with other risk factors, you may be asked to: Return on a different day to have your blood pressure checked again. Monitor your blood pressure at home for 1 week or longer. If you are diagnosed with hypertension, you may have other blood or imaging tests to help your health care provider understand your overall risk for other conditions. How is this treated? This condition is treated by making healthy lifestyle changes, such as eating healthy foods, exercising more, and reducing your alcohol intake. You may be referred for counseling on a healthy diet and physical activity. Your health care provider may prescribe medicine if lifestyle changes are not enough to get your blood pressure under control and if: Your systolic blood pressure is above 130. Your diastolic blood pressure is above 80. Your personal target blood pressure may vary depending on your medical conditions, your age, and other factors. Follow these instructions at home: Eating and drinking  Eat a diet that is high in fiber and potassium, and low in sodium, added sugar, and fat. An example of this eating plan is called the DASH diet. DASH stands for Dietary Approaches to Stop Hypertension. To eat this way: Eat   plenty of fresh fruits and vegetables. Try to fill one half of your plate at each meal with fruits and vegetables. Eat whole grains, such as whole-wheat pasta, brown rice, or whole-grain bread. Fill about one  fourth of your plate with whole grains. Eat or drink low-fat dairy products, such as skim milk or low-fat yogurt. Avoid fatty cuts of meat, processed or cured meats, and poultry with skin. Fill about one fourth of your plate with lean proteins, such as fish, chicken without skin, beans, eggs, or tofu. Avoid pre-made and processed foods. These tend to be higher in sodium, added sugar, and fat. Reduce your daily sodium intake. Many people with hypertension should eat less than 1,500 mg of sodium a day. Do not drink alcohol if: Your health care provider tells you not to drink. You are pregnant, may be pregnant, or are planning to become pregnant. If you drink alcohol: Limit how much you have to: 0-1 drink a day for women. 0-2 drinks a day for men. Know how much alcohol is in your drink. In the [place] one drink equals one 12 oz bottle of beer (355 mL), one 5 oz glass of wine (148 mL), or one 1 oz glass of hard liquor (44 mL). Lifestyle  Work with your health care provider to maintain a healthy body weight or to lose weight. Ask what an ideal weight is for you. Get at least 30 minutes of exercise that causes your heart to beat faster (aerobic exercise) most days of the week. Activities may include walking, swimming, or biking. Include exercise to strengthen your muscles (resistance exercise), such as Pilates or lifting weights, as part of your weekly exercise routine. Try to do these types of exercises for 30 minutes at least 3 days a week. Do not use any products that contain nicotine or tobacco. These products include cigarettes, chewing tobacco, and vaping devices, such as e-cigarettes. If you need help quitting, ask your health care provider. Monitor your blood pressure at home as told by your health care provider. Keep all follow-up visits. This is important. Medicines Take over-the-counter and prescription medicines only as told by your health care provider. Follow directions carefully. Blood  pressure medicines must be taken as prescribed. Do not skip doses of blood pressure medicine. Doing this puts you at risk for problems and can make the medicine less effective. Ask your health care provider about side effects or reactions to medicines that you should watch for. Contact a health care provider if you: Think you are having a reaction to a medicine you are taking. Have headaches that keep coming back (recurring). Feel dizzy. Have swelling in your ankles. Have trouble with your vision. Get help right away if you: Develop a severe headache or confusion. Have unusual weakness or numbness. Feel faint. Have severe pain in your chest or abdomen. Vomit repeatedly. Have trouble breathing. These symptoms may be an emergency. Get help right away. Call 911. Do not wait to see if the symptoms will go away. Do not drive yourself to the hospital. Summary Hypertension is when the force of blood pumping through your arteries is too strong. If this condition is not controlled, it may put you at risk for serious complications. Your personal target blood pressure may vary depending on your medical conditions, your age, and other factors. For most people, a normal blood pressure is less than 120/80. Hypertension is treated with lifestyle changes, medicines, or a combination of both. Lifestyle changes include losing weight, eating a healthy,   low-sodium diet, exercising more, and limiting alcohol. This information is not intended to replace advice given to you by your health care provider. Make sure you discuss any questions you have with your health care provider. Document Revised: 07/18/2021 Document Reviewed: 07/18/2021 Elsevier Patient Education  2023 [employer]vier Inc[employer]  

## 2022-05-08 NOTE — Progress Notes (Signed)
.  Pt presents for chronic care management   

## 2022-05-09 LAB — BASIC METABOLIC PANEL
BUN/Creatinine Ratio: 14 (ref 9–23)
BUN: 13 mg/dL (ref 6–20)
CO2: 21 mmol/L (ref 20–29)
Calcium: 9.3 mg/dL (ref 8.7–10.2)
Chloride: 101 mmol/L (ref 96–106)
Creatinine, Ser: 0.95 mg/dL (ref 0.57–1.00)
Glucose: 132 mg/dL — ABNORMAL HIGH (ref 70–99)
Potassium: 3.8 mmol/L (ref 3.5–5.2)
Sodium: 138 mmol/L (ref 134–144)
eGFR: 80 mL/min/{1.73_m2} (ref >59)

## 2022-07-13 ENCOUNTER — Encounter: Payer: Self-pay | Admitting: Internal Medicine

## 2022-08-14 ENCOUNTER — Ambulatory Visit (AMBULATORY_SURGERY_CENTER): Payer: Self-pay | Admitting: *Deleted

## 2022-08-14 VITALS — Ht 60.0 in | Wt 209.0 lb

## 2022-08-14 DIAGNOSIS — C189 Malignant neoplasm of colon, unspecified: Secondary | ICD-10-CM

## 2022-08-14 MED ORDER — NA SULFATE-K SULFATE-MG SULF 17.5-3.13-1.6 GM/177ML PO SOLN: 1.0000 | Freq: Once | ORAL | 0 refills | Status: AC

## 2022-08-14 NOTE — Progress Notes (Signed)
No egg or soy allergy known to patient  No issues known to pt with past sedation with any surgeries or procedures Patient denies ever being told they had issues or difficulty with intubation  No FH of Malignant Hyperthermia Pt is not on diet pills Pt is not on  home 02  Pt is not on blood thinners  Pt denies issues with constipation  No A fib or A flutter Have any cardiac testing pending--no Pt instructed to use Singlecare.com or GoodRx for a price reduction on prep  Patient's chart reviewed by Amy Kim CNRA prior to previsit and patient appropriate for the [facility].  Previsit completed and red dot placed by patient's name on their procedure day (on provider's schedule).    

## 2022-08-15 ENCOUNTER — Encounter: Payer: BC Managed Care – PPO | Admitting: Internal Medicine

## 2022-09-14 ENCOUNTER — Ambulatory Visit (AMBULATORY_SURGERY_CENTER): Payer: BC Managed Care – PPO | Admitting: Internal Medicine

## 2022-09-14 ENCOUNTER — Encounter: Payer: Self-pay | Admitting: Internal Medicine

## 2022-09-14 VITALS — BP 122/80 | HR 76 | Temp 97.7°F | Resp 11 | Ht 61.0 in | Wt 209.0 lb

## 2022-09-14 DIAGNOSIS — Z08 Encounter for follow-up examination after completed treatment for malignant neoplasm: Secondary | ICD-10-CM

## 2022-09-14 DIAGNOSIS — C189 Malignant neoplasm of colon, unspecified: Secondary | ICD-10-CM

## 2022-09-14 DIAGNOSIS — Z85038 Personal history of other malignant neoplasm of large intestine: Secondary | ICD-10-CM | POA: Diagnosis not present

## 2022-09-14 DIAGNOSIS — K635 Polyp of colon: Secondary | ICD-10-CM

## 2022-09-14 DIAGNOSIS — Z0389 Encounter for observation for other suspected diseases and conditions ruled out: Secondary | ICD-10-CM | POA: Diagnosis not present

## 2022-09-14 DIAGNOSIS — D125 Benign neoplasm of sigmoid colon: Secondary | ICD-10-CM

## 2022-09-14 MED ORDER — SODIUM CHLORIDE 0.9 % IV SOLN: 500.0000 mL | Freq: Once | INTRAVENOUS | Status: DC

## 2022-09-14 NOTE — Patient Instructions (Signed)
Information on polyps given to you today.  Await pathology results.  Resume previous diet and medications.    YOU HAD AN ENDOSCOPIC PROCEDURE TODAY AT THE [facility]:   Refer to the procedure report that was given to you for any specific questions about what was found during the examination.  If the procedure report does not answer your questions, please call your gastroenterologist to clarify.  If you requested that your care partner not be given the details of your procedure findings, then the procedure report has been included in a sealed envelope for you to review at your convenience later.  YOU SHOULD EXPECT: Some feelings of bloating in the abdomen. Passage of more gas than usual.  Walking can help get rid of the air that was put into your GI tract during the procedure and reduce the bloating. If you had a lower endoscopy (such as a colonoscopy or flexible sigmoidoscopy) you may notice spotting of blood in your stool or on the toilet paper. If you underwent a bowel prep for your procedure, you may not have a normal bowel movement for a few days.  Please Note:  You might notice some irritation and congestion in your nose or some drainage.  This is from the oxygen used during your procedure.  There is no need for concern and it should clear up in a day or so.  SYMPTOMS TO REPORT IMMEDIATELY:  Following lower endoscopy (colonoscopy or flexible sigmoidoscopy):  Excessive amounts of blood in the stool  Significant tenderness or worsening of abdominal pains  Swelling of the abdomen that is new, acute  Fever of 100F or higher  For urgent or emergent issues, a gastroenterologist can be reached at any hour by calling [phone]. Do not use MyChart messaging for urgent concerns.    DIET:  We do recommend a small meal at first, but then you may proceed to your regular diet.  Drink plenty of fluids but you should avoid alcoholic beverages for 24 hours.  ACTIVITY:  You should  plan to take it easy for the rest of today and you should NOT DRIVE or use heavy machinery until tomorrow (because of the sedation medicines used during the test).    FOLLOW UP: Our staff will call the number listed on your records the next business day following your procedure.  We will call around 7:15- 8:00 am to check on you and address any questions or concerns that you may have regarding the information given to you following your procedure. If we do not reach you, we will leave a message.     If any biopsies were taken you will be contacted by phone or by letter within the next 1-3 weeks.  Please call us at [phone] if you have not heard about the biopsies in 3 weeks.    SIGNATURES/CONFIDENTIALITY: You and/or your care partner have signed paperwork which will be entered into your electronic medical record.  These signatures attest to the fact that that the information above on your After Visit Summary has been reviewed and is understood.  Full responsibility of the confidentiality of this discharge information lies with you and/or your care-partner.  

## 2022-09-14 NOTE — Progress Notes (Signed)
GASTROENTEROLOGY PROCEDURE H&P NOTE   Primary Care Physician: Camillia Herter, NP    Reason for Procedure:   History of colon adenocarcinoma  Plan:    Colonoscopy  Patient is appropriate for endoscopic procedure(s) in the ambulatory ([facility]) setting.  The nature of the procedure, as well as the risks, benefits, and alternatives were carefully and thoroughly reviewed with the patient. Ample time for discussion and questions allowed. The patient understood, was satisfied, and agreed to proceed.     HPI: Amy Kim is a 36 y.o. female who presents for colonoscopy for evaluation of history of colon adenocarcinoma .  Patient was most recently seen in the Gastroenterology Clinic on 10/06/21.  No interval change in medical history since that appointment. Please refer to that note for full details regarding GI history and clinical presentation.   Past Medical History:  Diagnosis Date   Back pain    DDD   Cancer ([place])    adenocarcinoma of the colon   Diabetes ([place])    History of migraine    last one 2 days ago   IDA (iron deficiency anemia)    Pregnancy induced hypertension    Urinary frequency     Past Surgical History:  Procedure Laterality Date   CESAREAN SECTION     x 4 total   CESAREAN SECTION N/A 08/11/2015   Procedure: REPEAT CESAREAN SECTION;  Surgeon: Truett Mainland, DO;  Location: [facility];  Service: Obstetrics;  Laterality: N/A;   CESAREAN SECTION N/A 09/19/2018   Procedure: CESAREAN SECTION;  Surgeon: Osborne Oman, MD;  Location: [facility];  Service: Obstetrics;  Laterality: N/A;   CHOLECYSTECTOMY N/A 03/21/2016   Procedure: LAPAROSCOPIC CHOLECYSTECTOMY;  Surgeon: Ralene Ok, MD;  Location: [facility];  Service: General;  Laterality: N/A;   COLONOSCOPY     adebocarcinoma,10/22    Prior to Admission medications   Medication Sig Start Date End Date Taking? Authorizing Provider  acetaminophen (TYLENOL) 325 MG tablet Take 2 tablets (650 mg total)  by mouth every 6 (six) hours as needed. 09/07/21  Yes Wynona Dove A, DO  amLODipine (NORVASC) 5 MG tablet Take 1 tablet (5 mg total) by mouth daily. 05/08/22 11/04/22 Yes Camillia Herter, NP  metFORMIN (GLUCOPHAGE) 500 MG tablet Take 1 tablet (500 mg total) by mouth 2 (two) times daily with a meal. 05/08/22 11/04/22 Yes Minette Brine, Amy J, NP  gabapentin (NEURONTIN) 100 MG capsule Take 1 capsule (100 mg total) by mouth at bedtime. Patient not taking: Reported on 08/14/2022 11/16/21   Jessy Oto, MD  medroxyPROGESTERone Acetate 150 MG/ML SUSY Inject 150 mg as directed PRO. Every 90 days Patient not taking: Reported on 08/14/2022 05/13/21   [provider]    Current Outpatient Medications  Medication Sig Dispense Refill   acetaminophen (TYLENOL) 325 MG tablet Take 2 tablets (650 mg total) by mouth every 6 (six) hours as needed. 36 tablet 0   amLODipine (NORVASC) 5 MG tablet Take 1 tablet (5 mg total) by mouth daily. 30 tablet 5   metFORMIN (GLUCOPHAGE) 500 MG tablet Take 1 tablet (500 mg total) by mouth 2 (two) times daily with a meal. 60 tablet 5   gabapentin (NEURONTIN) 100 MG capsule Take 1 capsule (100 mg total) by mouth at bedtime. (Patient not taking: Reported on 08/14/2022) 30 capsule 2   medroxyPROGESTERone Acetate 150 MG/ML SUSY Inject 150 mg as directed PRO. Every 90 days (Patient not taking: Reported on 08/14/2022)     Current Facility-Administered  Medications  Medication Dose Route Frequency Provider Last Rate Last Admin   0.9 %  sodium chloride infusion  500 mL Intravenous Once Sharyn Creamer, MD       0.9 %  sodium chloride infusion  500 mL Intravenous Once Sharyn Creamer, MD        Allergies as of 09/14/2022 - Review Complete 09/14/2022  Allergen Reaction Noted   Aspirin  09/07/2021   Ibuprofen  09/07/2021    Family History  Problem Relation Age of Onset   Colon polyps Mother    Hypertension Mother    Hypertension Father    Heart disease Paternal Grandmother     Colon cancer Neg Hx    Esophageal cancer Neg Hx    Pancreatic cancer Neg Hx    Stomach cancer Neg Hx    Liver disease Neg Hx    Crohn's disease Neg Hx    Rectal cancer Neg Hx    Ulcerative colitis Neg Hx     Social History   Socioeconomic History   Marital status: Married    Spouse name: Not on file   Number of children: Not on file   Years of education: Not on file   Highest education level: Not on file  Occupational History   Not on file  Tobacco Use   Smoking status: Never    Passive exposure: Never   Smokeless tobacco: Never  Vaping Use   Vaping Use: Never used  Substance and Sexual Activity   Alcohol use: Not Currently    Comment: occasionally   Drug use: No   Sexual activity: Yes    Birth control/protection: Injection    Comment: Medroxyprogesterone   Other Topics Concern   Not on file  Social History Narrative   Not on file   Social Determinants of Health   Financial Resource Strain: Low Risk  (09/11/2018)   Overall Financial Resource Strain (CARDIA)    Difficulty of Paying Living Expenses: Not hard at all  Food Insecurity: No Food Insecurity (09/11/2018)   Hunger Vital Sign    Worried About Running Out of Food in the Last Year: Never true    Ran Out of Food in the Last Year: Never true  Transportation Needs: No Transportation Needs (09/19/2018)   PRAPARE - [name] (Medical): No    Lack of Transportation (Non-Medical): No  Physical Activity: Not on file  Stress: No Stress Concern Present (09/11/2018)   [place]    Feeling of Stress : Only a little  Social Connections: Not on file  Intimate Partner Violence: Not At Risk (09/11/2018)   Humiliation, Afraid, Rape, and Kick questionnaire    Fear of Current or Ex-Partner: No    Emotionally Abused: No    Physically Abused: No    Sexually Abused: No    Physical Exam: Vital signs in last 24 hours: BP  121/78   Pulse 77   Temp 97.7 F (36.5 C)   Ht '5\' 1"'$  (1.549 m)   Wt 209 lb (94.8 kg)   LMP 08/26/2022   SpO2 100%   BMI 39.49 kg/m  GEN: NAD EYE: Sclerae anicteric ENT: MMM CV: Non-tachycardic Pulm: No increased WOB GI: Soft NEURO:  Alert & Oriented   [name], MD [facility]   09/14/2022 11:35 AM

## 2022-09-14 NOTE — Op Note (Signed)
Amy Kim Patient Name: Amy Kim Procedure Date: 09/14/2022 11:43 AM MRN: [id number] Endoscopist: Adline Mango [place] , , [id number] Age: 36 Referring MD:  Date of Birth: [birth year] Gender: Female Account #: [id number] Procedure:                Colonoscopy Indications:              High risk colon cancer surveillance: Personal                            history of colon cancer Medicines:                Monitored Anesthesia Care Procedure:                Pre-Anesthesia Assessment:                           - Prior to the procedure, a History and Physical                            was performed, and patient medications and                            allergies were reviewed. The patient's tolerance of                            previous anesthesia was also reviewed. The risks                            and benefits of the procedure and the sedation                            options and risks were discussed with the patient.                            All questions were answered, and informed consent                            was obtained. Prior Anticoagulants: The patient has                            taken no anticoagulant or antiplatelet agents. ASA                            Grade Assessment: II - A patient with mild systemic                            disease. After reviewing the risks and benefits,                            the patient was deemed in satisfactory condition to                            undergo the procedure.  After obtaining informed consent, the colonoscope                            was passed under direct vision. Throughout the                            procedure, the patient's blood pressure, pulse, and                            oxygen saturations were monitored continuously. The                            CF [id number] #[id number] was introduced through the anus                            and advanced to the the  cecum, identified by                            appendiceal orifice and ileocecal valve. The                            colonoscopy was performed without difficulty. The                            patient tolerated the procedure well. The quality                            of the bowel preparation was good. The ileocecal                            valve, appendiceal orifice, and rectum were                            photographed. Scope In: 11:45:36 AM Scope Out: 12:03:22 PM Scope Withdrawal Time: 0 hours 12 minutes 21 seconds  Total Procedure Duration: 0 hours 17 minutes 46 seconds  Findings:                 A few diverticula were found in the entire colon.                           A 5 mm polyp was found in the sigmoid colon. The                            polyp was sessile. The polyp was removed with a hot                            snare. Resection and retrieval were complete.                           A tattoo was seen in the sigmoid colon. The tattoo                            site appeared  normal.                           Non-bleeding internal hemorrhoids were found during                            retroflexion. Complications:            No immediate complications. Estimated Blood Loss:     Estimated blood loss was minimal. Impression:               - Diverticulosis in the entire examined colon.                           - One 5 mm polyp in the sigmoid colon, removed with                            a hot snare. Resected and retrieved.                           - A tattoo was seen in the sigmoid colon. The                            tattoo site appeared normal.                           - Non-bleeding internal hemorrhoids. Recommendation:           - Discharge patient to home (with escort).                           - Await pathology results.                           - The findings and recommendations were discussed                            with the patient. Dr Georgian Co "[name]" Lorenso Courier,  09/14/2022 12:07:15 PM

## 2022-09-14 NOTE — Progress Notes (Signed)
Sedate, gd SR, tolerated procedure well, VSS, report to RN 

## 2022-09-14 NOTE — Progress Notes (Signed)
Pt's states no medical or surgical changes since previsit or office visit. 

## 2022-09-14 NOTE — Progress Notes (Signed)
Called to room to assist during endoscopic procedure.  Patient ID and intended procedure confirmed with present staff. Received instructions for my participation in the procedure from the performing physician.  

## 2022-09-19 ENCOUNTER — Telehealth: Payer: Self-pay | Admitting: *Deleted

## 2022-09-19 NOTE — Telephone Encounter (Signed)
  Follow up Call-     09/14/2022   11:18 AM 08/10/2021   11:09 AM 07/20/2021    2:18 PM  Call back number  Post procedure Call Back phone  # [phone]  Permission to leave phone message Yes Yes Yes     Patient questions:  Do you have a fever, pain , or abdominal swelling? No. Pain Score  0 *  Have you tolerated food without any problems? Yes.    Have you been able to return to your normal activities? Yes.    Do you have any questions about your discharge instructions: Diet   No. Medications  No. Follow up visit  No.  Do you have questions or concerns about your Care? No.  Actions: * If pain score is 4 or above: No action needed, pain <4.

## 2022-09-22 ENCOUNTER — Encounter: Payer: Self-pay | Admitting: Internal Medicine

## 2022-10-30 NOTE — Progress Notes (Signed)
Patient ID: Amy Kim, female    DOB: 03/07/1986  MRN: [id number]  CC: Chronic Care Management   Subjective: Amy Kim is a 37 y.o. female who presents for chronic care management.   Her concerns today include:  Reports she is doing well on blood pressure and diabetes medications, no issues/concerns. She denies red flag symptoms. No issues/concerns for discussion on today.   Patient Active Problem List   Diagnosis Date Noted   Iron deficiency anemia due to chronic blood loss 05/30/2021   Class II obesity 08/26/2020   New onset type 2 diabetes mellitus ([place]) 01/13/2020   Thrombocytosis 01/13/2020   Essential hypertension 12/17/2019   Refusal of blood transfusions as patient is Jehovah's Witness 09/18/2018   Abdominal cyst 03/29/2015   History of C-section 03/24/2015     Current Outpatient Medications on File Prior to Visit  Medication Sig Dispense Refill   acetaminophen (TYLENOL) 325 MG tablet Take 2 tablets (650 mg total) by mouth every 6 (six) hours as needed. 36 tablet 0   gabapentin (NEURONTIN) 100 MG capsule Take 1 capsule (100 mg total) by mouth at bedtime. (Patient not taking: Reported on 08/14/2022) 30 capsule 2   Current Facility-Administered Medications on File Prior to Visit  Medication Dose Route Frequency Provider Last Rate Last Admin   0.9 %  sodium chloride infusion  500 mL Intravenous Once Sharyn Creamer, MD       0.9 %  sodium chloride infusion  500 mL Intravenous Once Sharyn Creamer, MD        Allergies  Allergen Reactions   Aspirin    Ibuprofen     Social History   Socioeconomic History   Marital status: Married    Spouse name: Not on file   Number of children: Not on file   Years of education: Not on file   Highest education level: Not on file  Occupational History   Not on file  Tobacco Use   Smoking status: Never    Passive exposure: Never   Smokeless tobacco: Never  Vaping Use   Vaping Use: Never used  Substance and Sexual  Activity   Alcohol use: Not Currently    Comment: occasionally   Drug use: No   Sexual activity: Yes    Birth control/protection: Injection    Comment: Medroxyprogesterone   Other Topics Concern   Not on file  Social History Narrative   Not on file   Social Determinants of Health   Financial Resource Strain: Low Risk  (09/11/2018)   Overall Financial Resource Strain (CARDIA)    Difficulty of Paying Living Expenses: Not hard at all  Food Insecurity: No Food Insecurity (09/11/2018)   Hunger Vital Sign    Worried About Running Out of Food in the Last Year: Never true    [place] in the Last Year: Never true  Transportation Needs: No Transportation Needs (09/19/2018)   PRAPARE - [name] (Medical): No    Lack of Transportation (Non-Medical): No  Physical Activity: Not on file  Stress: No Stress Concern Present (09/11/2018)   [place]    Feeling of Stress : Only a little  Social Connections: Not on file  Intimate Partner Violence: Not At Risk (09/11/2018)   Humiliation, Afraid, Rape, and Kick questionnaire    Fear of Current or Ex-Partner: No    Emotionally Abused: No    Physically Abused: No  Sexually Abused: No    Family History  Problem Relation Age of Onset   Colon polyps Mother    Hypertension Mother    Hypertension Father    Heart disease Paternal Grandmother    Colon cancer Neg Hx    Esophageal cancer Neg Hx    Pancreatic cancer Neg Hx    Stomach cancer Neg Hx    Liver disease Neg Hx    Crohn's disease Neg Hx    Rectal cancer Neg Hx    Ulcerative colitis Neg Hx     Past Surgical History:  Procedure Laterality Date   CESAREAN SECTION     x 4 total   CESAREAN SECTION N/A 08/11/2015   Procedure: REPEAT CESAREAN SECTION;  Surgeon: Truett Mainland, DO;  Location: [facility];  Service: Obstetrics;  Laterality: N/A;   CESAREAN SECTION N/A 09/19/2018    Procedure: CESAREAN SECTION;  Surgeon: Osborne Oman, MD;  Location: [facility];  Service: Obstetrics;  Laterality: N/A;   CHOLECYSTECTOMY N/A 03/21/2016   Procedure: LAPAROSCOPIC CHOLECYSTECTOMY;  Surgeon: Ralene Ok, MD;  Location: [facility];  Service: General;  Laterality: N/A;   COLONOSCOPY     adebocarcinoma,10/22    ROS: Review of Systems Negative except as stated above  PHYSICAL EXAM: BP 136/85 (BP Location: Left Arm, Patient Position: Sitting, Cuff Size: Large)   Pulse 78   Temp 98.3 F (36.8 C)   Resp 16   Ht 5' 0.98" (1.549 m)   Wt 200 lb (90.7 kg)   SpO2 95%   Breastfeeding No   BMI 37.81 kg/m   Physical Exam HENT:     Head: Normocephalic and atraumatic.  Eyes:     Extraocular Movements: Extraocular movements intact.     Conjunctiva/sclera: Conjunctivae normal.     Pupils: Pupils are equal, round, and reactive to light.  Cardiovascular:     Rate and Rhythm: Normal rate and regular rhythm.     Pulses: Normal pulses.     Heart sounds: Normal heart sounds.  Pulmonary:     Effort: Pulmonary effort is normal.     Breath sounds: Normal breath sounds.  Musculoskeletal:     Cervical back: Normal range of motion and neck supple.  Neurological:     General: No focal deficit present.     Mental Status: She is alert and oriented to person, place, and time.  Psychiatric:        Mood and Affect: Mood normal.        Behavior: Behavior normal.    ASSESSMENT AND PLAN: 1. Primary hypertension - Continue Amlodipine as prescribed.  - Routine screening.  - Counseled on blood pressure goal of less than 130/80, low-sodium, DASH diet, medication compliance, and 150 minutes of moderate intensity exercise per week as tolerated. Counseled on medication adherence and adverse effects. - Follow-up with primary provider in 3 months or sooner if needed. - Basic Metabolic Panel - amLODipine (NORVASC) 5 MG tablet; Take 1 tablet (5 mg total) by mouth daily.  Dispense: 30  tablet; Refill: 2  2. Type 2 diabetes mellitus without complication, without long-term current use of insulin (HCC) - Hemoglobin A1c at goal at 6.6%, goal 7%. This is improved compared to previous 6.8%.  - Continue Metformin as prescribed.  - Discussed the importance of healthy eating habits, low-carbohydrate diet, low-sugar diet, regular aerobic exercise (at least 150 minutes a week as tolerated) and medication compliance to achieve or maintain control of diabetes. - Follow-up with primary provider in  3 months or sooner if needed.  - POCT glycosylated hemoglobin (Hb A1C) - metFORMIN (GLUCOPHAGE) 500 MG tablet; Take 1 tablet (500 mg total) by mouth 2 (two) times daily with a meal.  Dispense: 60 tablet; Refill: 2   Patient was given the opportunity to ask questions.  Patient verbalized understanding of the plan and was able to repeat key elements of the plan. Patient was given clear instructions to go to Emergency Department or return to medical center if symptoms don't improve, worsen, or new problems develop.The patient verbalized understanding.   Orders Placed This Encounter  Procedures   Basic Metabolic Panel   POCT glycosylated hemoglobin (Hb A1C)    Requested Prescriptions   Signed Prescriptions Disp Refills   amLODipine (NORVASC) 5 MG tablet 30 tablet 2    Sig: Take 1 tablet (5 mg total) by mouth daily.   metFORMIN (GLUCOPHAGE) 500 MG tablet 60 tablet 2    Sig: Take 1 tablet (500 mg total) by mouth 2 (two) times daily with a meal.    Return in about 3 months (around 02/03/2023) for Follow-Up or next available chronic care mgmt .  Camillia Herter, NP

## 2022-11-05 ENCOUNTER — Encounter: Payer: Self-pay | Admitting: Family

## 2022-11-05 ENCOUNTER — Ambulatory Visit (INDEPENDENT_AMBULATORY_CARE_PROVIDER_SITE_OTHER): Payer: Self-pay | Admitting: Family

## 2022-11-05 VITALS — BP 136/85 | HR 78 | Temp 98.3°F | Resp 16 | Ht 60.98 in | Wt 200.0 lb

## 2022-11-05 DIAGNOSIS — E119 Type 2 diabetes mellitus without complications: Secondary | ICD-10-CM

## 2022-11-05 DIAGNOSIS — I10 Essential (primary) hypertension: Secondary | ICD-10-CM

## 2022-11-05 LAB — POCT GLYCOSYLATED HEMOGLOBIN (HGB A1C): HbA1c, POC (controlled diabetic range): 6.6 % (ref 0.0–7.0)

## 2022-11-05 MED ORDER — METFORMIN HCL 500 MG PO TABS: 500.0000 mg | ORAL_TABLET | Freq: Two times a day (BID) | ORAL | 2 refills | Status: DC

## 2022-11-05 MED ORDER — AMLODIPINE BESYLATE 5 MG PO TABS: 5.0000 mg | ORAL_TABLET | Freq: Every day | ORAL | 2 refills | Status: DC

## 2022-11-05 NOTE — Progress Notes (Signed)
.  Pt presents for chronic care management

## 2022-11-06 LAB — BASIC METABOLIC PANEL
BUN/Creatinine Ratio: 23 (ref 9–23)
BUN: 15 mg/dL (ref 6–20)
CO2: 18 mmol/L — ABNORMAL LOW (ref 20–29)
Calcium: 9.6 mg/dL (ref 8.7–10.2)
Chloride: 104 mmol/L (ref 96–106)
Creatinine, Ser: 0.64 mg/dL (ref 0.57–1.00)
Glucose: 109 mg/dL — ABNORMAL HIGH (ref 70–99)
Potassium: 3.8 mmol/L (ref 3.5–5.2)
Sodium: 137 mmol/L (ref 134–144)
eGFR: 117 mL/min/{1.73_m2} (ref >59)

## 2022-11-14 ENCOUNTER — Telehealth: Payer: Self-pay | Admitting: Family

## 2022-11-14 NOTE — Telephone Encounter (Signed)
Copied from [place] [phone]. Topic: General - Inquiry >> Nov 13, 2022  4:44 PM Penni Bombard wrote: Reason for CRM: pt called to tell you that she is pregnant and wants to know who would you refer her to?  CB#  [phone]

## 2022-11-14 NOTE — Telephone Encounter (Signed)
Pt can visit her gynecologist since she is pregnant. If she needs a referral, pt would need to let us know so provider can complete.

## 2022-11-16 ENCOUNTER — Ambulatory Visit (INDEPENDENT_AMBULATORY_CARE_PROVIDER_SITE_OTHER): Payer: Self-pay

## 2022-11-16 VITALS — BP 143/98 | HR 82

## 2022-11-16 DIAGNOSIS — O161 Unspecified maternal hypertension, first trimester: Secondary | ICD-10-CM

## 2022-11-16 DIAGNOSIS — Z3200 Encounter for pregnancy test, result unknown: Secondary | ICD-10-CM

## 2022-11-16 DIAGNOSIS — Z3201 Encounter for pregnancy test, result positive: Secondary | ICD-10-CM

## 2022-11-16 LAB — POCT URINE PREGNANCY: Preg Test, Ur: POSITIVE — AB

## 2022-11-16 MED ORDER — NIFEDIPINE ER OSMOTIC RELEASE 30 MG PO TB24: 30.0000 mg | ORAL_TABLET | Freq: Every day | ORAL | 0 refills | Status: DC

## 2022-11-16 NOTE — Progress Notes (Signed)
..  Amy Kim presents today for UPT. She has no unusual complaints. LMP:09/22/2022    OBJECTIVE: Appears well, in no apparent distress. BP was elevated today at 143/98 while taking Amlodipine 35m Daily. Pt states that this is normally how high her BPs are . OB History     Gravida  6   Para  4   Term  4   Preterm  0   AB  1   Living  4      SAB  1   IAB  0   Ectopic  0   Multiple  0   Live Births  4          Home UPT Result: Positive In-Office UPT result: Positive I have reviewed the patient's medical, obstetrical, social, and family histories, and medications.   ASSESSMENT: Positive pregnancy test  PLAN Prenatal care to be completed at: [facility] Provided Safe-med list Consulted with in-office provider about elevated BP Advised pt to stop Amlodipine and Start Nifedipine 317mDaily, pt agreed RX sent to pharmacy

## 2022-11-23 ENCOUNTER — Other Ambulatory Visit (HOSPITAL_COMMUNITY)
Admission: RE | Admit: 2022-11-23 | Discharge: 2022-11-23 | Disposition: A | Discharge status: Home / Self Care | Payer: 59 | Source: Ambulatory Visit | Attending: Obstetrics and Gynecology | Admitting: Obstetrics and Gynecology

## 2022-11-23 ENCOUNTER — Ambulatory Visit (INDEPENDENT_AMBULATORY_CARE_PROVIDER_SITE_OTHER): Payer: Self-pay

## 2022-11-23 ENCOUNTER — Ambulatory Visit: Payer: Medicaid Other | Admitting: *Deleted

## 2022-11-23 VITALS — BP 131/83 | HR 82 | Ht 61.0 in | Wt 208.0 lb

## 2022-11-23 DIAGNOSIS — O0990 Supervision of high risk pregnancy, unspecified, unspecified trimester: Secondary | ICD-10-CM

## 2022-11-23 DIAGNOSIS — O3680X0 Pregnancy with inconclusive fetal viability, not applicable or unspecified: Secondary | ICD-10-CM

## 2022-11-23 DIAGNOSIS — Z3A08 8 weeks gestation of pregnancy: Secondary | ICD-10-CM

## 2022-11-23 DIAGNOSIS — O0993 Supervision of high risk pregnancy, unspecified, third trimester: Secondary | ICD-10-CM

## 2022-11-23 NOTE — Progress Notes (Signed)
New OB Intake  I connected with Amy Kim  on 11/23/22 at  8:15 AM EST by In Person Visit and verified that I am speaking with the correct person using two identifiers. Nurse is located at [facility] and pt is located at [place].  I discussed the limitations, risks, security and privacy concerns of performing an evaluation and management service by telephone and the availability of in person appointments. I also discussed with the patient that there may be a patient responsible charge related to this service. The patient expressed understanding and agreed to proceed.  I explained I am completing New OB Intake today. We discussed EDD of 06/29/23 that is based on LMP of 09/22/22. Pt is G6/P4. I reviewed her allergies, medications, Medical/Surgical/OB history, and appropriate screenings. I informed her of [facility] services. [facility] information placed in AVS. Based on history, this is a high risk pregnancy.  Patient Active Problem List   Diagnosis Date Noted   Iron deficiency anemia due to chronic blood loss 05/30/2021   Class II obesity 08/26/2020   New onset type 2 diabetes mellitus ([place]) 01/13/2020   Thrombocytosis 01/13/2020   Essential hypertension 12/17/2019   Refusal of blood transfusions as patient is Jehovah's Witness 09/18/2018   Abdominal cyst 03/29/2015   History of C-section 03/24/2015    Concerns addressed today  Delivery Plans Plans to deliver at [facility]. Patient given information for [facility] Healthy Baby website for more information about Women's and [place]. Patient is not a candidate for water birth.  MyChart/Babyscripts MyChart access verified. I explained pt will have some visits in office and some virtually. Babyscripts instructions given and order placed. Patient verifies receipt of registration text/e-mail. Account successfully created and app downloaded.  Blood Pressure Cuff/Weight Scale Pt has access to BP cuff at work and prefers to use that. Explained after  first prenatal appt pt will check weekly and document in 5. Patient does not have weight scale; patient may purchase if they desire to track weight weekly in Babyscripts.  Anatomy US Explained first scheduled [place] will be around 19 weeks. Anatomy US scheduled for 02/01/23 at [facility]. Pt notified to arrive at 10:15.  Labs Discussed Johnsie Cancel genetic screening with patient. Would like both Panorama and Horizon drawn at new OB visit. Routine prenatal labs needed.  COVID Vaccine Patient has had COVID vaccine.   Social Determinants of Health Food Insecurity: Patient denies food insecurity. WIC Referral: Patient is interested in referral to [facility].  Transportation: Patient denies transportation needs. Childcare: Discussed no children allowed at ultrasound appointments. Offered childcare services; patient declines childcare services at this time.  Interested in [place]? If yes, send referral.   First visit review I reviewed new OB appt with patient. I explained they will have a provider visit that includes Pap, labs and discussion with provider. Explained pt will be seen by Dr. Jodi Mourning at first visit; encounter routed to appropriate provider. Explained that patient will be seen by pregnancy navigator following visit with provider.   Penny Pia, RN 11/23/2022  8:18 AM

## 2022-11-24 LAB — CBC/D/PLT+RPR+RH+ABO+RUBIGG...
Antibody Screen: NEGATIVE
Basophils Absolute: 0 10*3/uL (ref 0.0–0.2)
Basos: 0 %
EOS (ABSOLUTE): 0.1 10*3/uL (ref 0.0–0.4)
Eos: 1 %
HCV Ab: NONREACTIVE
HIV Screen 4th Generation wRfx: NONREACTIVE
Hematocrit: 37.2 % (ref 34.0–46.6)
Hemoglobin: 12.4 g/dL (ref 11.1–15.9)
Hepatitis B Surface Ag: NEGATIVE
Immature Grans (Abs): 0 10*3/uL (ref 0.0–0.1)
Immature Granulocytes: 0 %
Lymphocytes Absolute: 1.8 10*3/uL (ref 0.7–3.1)
Lymphs: 20 %
MCH: 27 pg (ref 26.6–33.0)
MCHC: 33.3 g/dL (ref 31.5–35.7)
MCV: 81 fL (ref 79–97)
Monocytes Absolute: 0.5 10*3/uL (ref 0.1–0.9)
Monocytes: 6 %
Neutrophils Absolute: 6.3 10*3/uL (ref 1.4–7.0)
Neutrophils: 73 %
Platelets: 480 10*3/uL — ABNORMAL HIGH (ref 150–450)
RBC: 4.6 x10E6/uL (ref 3.77–5.28)
RDW: 12.9 % (ref 11.7–15.4)
RPR Ser Ql: NONREACTIVE
Rh Factor: POSITIVE
Rubella Antibodies, IGG: 1.1 index (ref >0.99)
WBC: 8.7 10*3/uL (ref 3.4–10.8)

## 2022-11-24 LAB — HCV INTERPRETATION

## 2022-11-26 LAB — CULTURE, OB URINE

## 2022-11-26 LAB — URINE CULTURE, OB REFLEX: Organism ID, Bacteria: NO GROWTH

## 2022-11-26 LAB — CERVICOVAGINAL ANCILLARY ONLY
Chlamydia: NEGATIVE
Comment: NEGATIVE
Comment: NORMAL
Neisseria Gonorrhea: NEGATIVE

## 2022-11-30 ENCOUNTER — Telehealth: Payer: Self-pay

## 2022-11-30 NOTE — Telephone Encounter (Signed)
Called patient to follow up on elevated BP reading reported by [facility]. Patient states that she has taken her medication this morning at about 7:15. Denies headaches or visual changes. BP recheck during phone call was 138/76. Patient advised to continue to monitor at this time.

## 2022-12-26 ENCOUNTER — Encounter: Payer: Self-pay | Admitting: Obstetrics

## 2022-12-26 ENCOUNTER — Other Ambulatory Visit (HOSPITAL_COMMUNITY)
Admission: RE | Admit: 2022-12-26 | Discharge: 2022-12-26 | Disposition: A | Discharge status: Home / Self Care | Payer: 59 | Source: Ambulatory Visit | Attending: Obstetrics | Admitting: Obstetrics

## 2022-12-26 ENCOUNTER — Ambulatory Visit (INDEPENDENT_AMBULATORY_CARE_PROVIDER_SITE_OTHER): Payer: 59 | Admitting: Obstetrics

## 2022-12-26 VITALS — BP 136/87 | HR 102 | Wt 210.0 lb

## 2022-12-26 DIAGNOSIS — O0990 Supervision of high risk pregnancy, unspecified, unspecified trimester: Secondary | ICD-10-CM

## 2022-12-26 DIAGNOSIS — O99211 Obesity complicating pregnancy, first trimester: Secondary | ICD-10-CM | POA: Diagnosis not present

## 2022-12-26 DIAGNOSIS — Z1339 Encounter for screening examination for other mental health and behavioral disorders: Secondary | ICD-10-CM

## 2022-12-26 DIAGNOSIS — O219 Vomiting of pregnancy, unspecified: Secondary | ICD-10-CM | POA: Diagnosis not present

## 2022-12-26 DIAGNOSIS — O09521 Supervision of elderly multigravida, first trimester: Secondary | ICD-10-CM

## 2022-12-26 DIAGNOSIS — O99210 Obesity complicating pregnancy, unspecified trimester: Secondary | ICD-10-CM

## 2022-12-26 DIAGNOSIS — Z3A13 13 weeks gestation of pregnancy: Secondary | ICD-10-CM

## 2022-12-26 DIAGNOSIS — O169 Unspecified maternal hypertension, unspecified trimester: Secondary | ICD-10-CM

## 2022-12-26 DIAGNOSIS — O09522 Supervision of elderly multigravida, second trimester: Secondary | ICD-10-CM

## 2022-12-26 DIAGNOSIS — O161 Unspecified maternal hypertension, first trimester: Secondary | ICD-10-CM | POA: Diagnosis not present

## 2022-12-26 MED ORDER — PROMETHAZINE HCL 12.5 MG PO TABS: 12.5000 mg | ORAL_TABLET | Freq: Four times a day (QID) | ORAL | 0 refills | Status: DC | PRN

## 2022-12-26 MED ORDER — PROMETHAZINE HCL 12.5 MG PO TABS: 12.5000 mg | ORAL_TABLET | Freq: Four times a day (QID) | ORAL | 2 refills | Status: DC | PRN

## 2022-12-26 MED ORDER — LABETALOL HCL 200 MG PO TABS: 200.0000 mg | ORAL_TABLET | Freq: Two times a day (BID) | ORAL | 6 refills | Status: DC

## 2022-12-26 NOTE — Progress Notes (Signed)
Subjective:    Amy Kim is being seen today for her first obstetrical visit.  This is a planned pregnancy. She is at [redacted]w[redacted]d gestation. Her obstetrical history is significant for obesity, pregnancy induced hypertension, and thrombocytosis . Relationship with FOB: spouse, living together. Patient does intend to breast feed. Pregnancy history fully reviewed.  The information documented in the HPI was reviewed and verified.  Menstrual History: OB History     Gravida  6   Para  4   Term  4   Preterm  0   AB  1   Living  4      SAB  1   IAB  0   Ectopic  0   Multiple  0   Live Births  4           Patient's last menstrual period was 09/22/2022.    Past Medical History:  Diagnosis Date   Back pain    DDD   Cancer ([place])    adenocarcinoma of the colon   Diabetes ([place])    Gastric ulcer 2021   History of migraine    last one 2 days ago   IDA (iron deficiency anemia)    Pregnancy induced hypertension    Urinary frequency     Past Surgical History:  Procedure Laterality Date   CESAREAN SECTION  2011   x 4 total   CESAREAN SECTION N/A 08/11/2015   Procedure: REPEAT CESAREAN SECTION;  Surgeon: Truett Mainland, DO;  Location: [facility];  Service: Obstetrics;  Laterality: N/A;   CESAREAN SECTION N/A 09/19/2018   Procedure: CESAREAN SECTION;  Surgeon: Osborne Oman, MD;  Location: [facility];  Service: Obstetrics;  Laterality: N/A;   CESAREAN SECTION  2014   CHOLECYSTECTOMY N/A 03/21/2016   Procedure: LAPAROSCOPIC CHOLECYSTECTOMY;  Surgeon: Ralene Ok, MD;  Location: [facility];  Service: General;  Laterality: N/A;   COLONOSCOPY     adebocarcinoma,10/22    (Not in a hospital admission)  Allergies  Allergen Reactions   Aspirin     Hx of bleeding ulcers   Ibuprofen     Hx of bleeding ulcers    Social History   Tobacco Use   Smoking status: Never    Passive exposure: Never   Smokeless tobacco: Never  Substance Use Topics   Alcohol use:  Not Currently    Comment: occasionally    Family History  Problem Relation Age of Onset   Colon polyps Mother    Hypertension Mother    Hypertension Father    Heart disease Paternal Grandmother    Colon cancer Neg Hx    Esophageal cancer Neg Hx    Pancreatic cancer Neg Hx    Stomach cancer Neg Hx    Liver disease Neg Hx    Crohn's disease Neg Hx    Rectal cancer Neg Hx    Ulcerative colitis Neg Hx      Review of Systems Constitutional: negative for weight loss Gastrointestinal: negative for vomiting Genitourinary:negative for genital lesions and vaginal discharge and dysuria Musculoskeletal:negative for back pain Behavioral/Psych: negative for abusive relationship, depression, illegal drug usage and tobacco use    Objective:    BP 136/87   Pulse (!) 102   Wt 210 lb (95.3 kg)   LMP 09/22/2022   BMI 39.68 kg/m  General Appearance:    Alert, cooperative, no distress, appears stated age  Head:    Normocephalic, without obvious abnormality, atraumatic  Eyes:    PERRL, conjunctiva/corneas  clear, EOM's intact, fundi    benign, both eyes  Ears:    Normal TM's and external ear canals, both ears  Nose:   Nares normal, septum midline, mucosa normal, no drainage    or sinus tenderness  Throat:   Lips, mucosa, and tongue normal; teeth and gums normal  Neck:   Supple, symmetrical, trachea midline, no adenopathy;    thyroid:  no enlargement/tenderness/nodules; no carotid   bruit or JVD  Back:     Symmetric, no curvature, ROM normal, no CVA tenderness  Lungs:     Clear to auscultation bilaterally, respirations unlabored  Chest Wall:    No tenderness or deformity   Heart:    Regular rate and rhythm, S1 and S2 normal, no murmur, rub   or gallop  Breast Exam:    No tenderness, masses, or nipple abnormality  Abdomen:     Soft, non-tender, bowel sounds active all four quadrants,    no masses, no organomegaly  Genitalia:    Normal female without lesion, discharge or tenderness   Extremities:   Extremities normal, atraumatic, no cyanosis or edema  Pulses:   2+ and symmetric all extremities  Skin:   Skin color, texture, turgor normal, no rashes or lesions  Lymph nodes:   Cervical, supraclavicular, and axillary nodes normal  Neurologic:   CNII-XII intact, normal strength, sensation and reflexes    throughout      Lab Review Urine pregnancy test Labs reviewed yes Radiologic studies reviewed no  Assessment:    Pregnancy at [redacted]w[redacted]d weeks    Plan:      Prenatal vitamins.  Counseling provided regarding continued use of seat belts, cessation of alcohol consumption, smoking or use of illicit drugs; infection precautions i.e., influenza/TDAP immunizations, toxoplasmosis,CMV, parvovirus, listeria and varicella; workplace safety, exercise during pregnancy; routine dental care, safe medications, sexual activity, hot tubs, saunas, pools, travel, caffeine use, fish and methlymercury, potential toxins, hair treatments, varicose veins Weight gain recommendations per IOM guidelines reviewed: underweight/BMI< 18.5--> gain 28 - 40 lbs; normal weight/BMI 18.5 - 24.9--> gain 25 - 35 lbs; overweight/BMI 25 - 29.9--> gain 15 - 25 lbs; obese/BMI >30->gain  11 - 20 lbs Problem list reviewed and updated. FIRST/CF mutation testing/NIPT/QUAD SCREEN/fragile X/Ashkenazi Jewish population testing/Spinal muscular atrophy discussed: requested. Role of ultrasound in pregnancy discussed; fetal survey: requested. Amniocentesis discussed: not indicated.  Meds ordered this encounter  Medications   labetalol (NORMODYNE) 200 MG tablet    Sig: Take 1 tablet (200 mg total) by mouth 2 (two) times daily.    Dispense:  60 tablet    Refill:  6   DISCONTD: promethazine (PHENERGAN) 12.5 MG tablet    Sig: Take 1 tablet (12.5 mg total) by mouth every 6 (six) hours as needed for nausea or vomiting.    Dispense:  30 tablet    Refill:  0   promethazine (PHENERGAN) 12.5 MG tablet    Sig: Take 1 tablet  (12.5 mg total) by mouth every 6 (six) hours as needed for nausea or vomiting.    Dispense:  30 tablet    Refill:  2   Orders Placed This Encounter  Procedures   PANORAMA PRENATAL TEST FULL PANEL    ==========Department Information========== ID: [id number] Department:[facility] [address] [place] [address] Dept: [phone] Dept Fax: [phone]     Order Specific  Question:   Expected due date (MM/DD/YYYY):    Answer:   06/29/2023    Order Specific Question:   Is this a twin pregnancy? (viable, no vanished twin)    Answer:   No    Order Specific Question:   Is this a surrogate or egg donor pregnancy?    Answer:   No    Order Specific Question:   I want fetal sex included in the report:    Answer:   Yes    Order Specific Question:   Maternal Weight (lbs):    Answer:   70    Order Specific Question:   Which Microdeletion Panel should be ordered?    Answer:   22q11.2 Deletion    Order Specific Question:   What type of billing?    Answer:   Programme researcher, broadcasting/film/video Question:   By placing this electronic order I confirm the testing ordered herein is medically necessary and this patient has been informed of the details of the genetic test(s) ordered, including the risks, benefits, and alternatives, and has consented to testing.    Answer:   Yes    Order Specific Question:   Select an order diagnosis: For additional options refer to [email or web address]    Answer:   Supervision of elderly multigravida in second trimester [id number]   TSH    Follow up in 4 weeks    [name], MD 12/26/2022 4:10 PM

## 2022-12-26 NOTE — Progress Notes (Signed)
NOB, reports no concerns today. 

## 2022-12-28 LAB — TSH: TSH: 2.21 u[IU]/mL (ref 0.450–4.500)

## 2022-12-28 LAB — CYTOLOGY - PAP
Comment: NEGATIVE
Diagnosis: NEGATIVE
High risk HPV: NEGATIVE

## 2023-01-03 LAB — PANORAMA PRENATAL TEST FULL PANEL:PANORAMA TEST PLUS 5 ADDITIONAL MICRODELETIONS: FETAL FRACTION: 4.6

## 2023-01-21 ENCOUNTER — Telehealth: Payer: Self-pay

## 2023-01-21 NOTE — Telephone Encounter (Signed)
Called patient to follow up on elevated BP readings reported by [facility]. No answer. Left message for patient to return call to the office

## 2023-01-22 ENCOUNTER — Encounter: Payer: 59 | Admitting: Obstetrics

## 2023-01-23 ENCOUNTER — Encounter: Payer: Self-pay | Admitting: Family Medicine

## 2023-01-23 ENCOUNTER — Ambulatory Visit (INDEPENDENT_AMBULATORY_CARE_PROVIDER_SITE_OTHER): Payer: 59 | Admitting: Family Medicine

## 2023-01-23 VITALS — BP 138/97 | HR 100 | Wt 208.8 lb

## 2023-01-23 DIAGNOSIS — I10 Essential (primary) hypertension: Secondary | ICD-10-CM

## 2023-01-23 DIAGNOSIS — O0990 Supervision of high risk pregnancy, unspecified, unspecified trimester: Secondary | ICD-10-CM

## 2023-01-23 DIAGNOSIS — O24112 Pre-existing diabetes mellitus, type 2, in pregnancy, second trimester: Secondary | ICD-10-CM

## 2023-01-23 DIAGNOSIS — O169 Unspecified maternal hypertension, unspecified trimester: Secondary | ICD-10-CM

## 2023-01-23 DIAGNOSIS — Z3A17 17 weeks gestation of pregnancy: Secondary | ICD-10-CM

## 2023-01-23 MED ORDER — LABETALOL HCL 200 MG PO TABS
200.0000 mg | ORAL_TABLET | Freq: Two times a day (BID) | ORAL | 6 refills | Status: DC
Start: 2023-01-23 — End: 2023-06-06

## 2023-01-23 NOTE — Progress Notes (Signed)
   PRENATAL VISIT NOTE  Subjective:  Amy Kim is a 37 y.o. [id number] at [redacted]w[redacted]d being seen today for ongoing prenatal care.  She is currently monitored for the following issues for this {Blank single:19197::"high-risk","low-risk"} pregnancy and has History of C-section; Abdominal cyst; Refusal of blood transfusions as patient is Jehovah's Witness; Essential hypertension; New onset type 2 diabetes mellitus (HCC); Thrombocytosis; Class II obesity; Iron deficiency anemia due to chronic blood loss; and Supervision of high risk pregnancy, antepartum on their problem list.  Patient reports {sx:14538}.  Contractions: Not present. Vag. Bleeding: None.  Movement: Present. Denies leaking of fluid.   The following portions of the patient's history were reviewed and updated as appropriate: allergies, current medications, past family history, past medical history, past social history, past surgical history and problem list.   Objective:   Vitals:   01/23/23 1534 01/23/23 1541  BP: (!) 148/92 (!) 138/97  Pulse: (!) 102 100  Weight: 208 lb 12.8 oz (94.7 kg)     Fetal Status: Fetal Heart Rate (bpm): 140   Movement: Present     General:  Alert, oriented and cooperative. Patient is in no acute distress.  Skin: Skin is warm and dry. No rash noted.   Cardiovascular: Normal heart rate noted  Respiratory: Normal respiratory effort, no problems with respiration noted  Abdomen: Soft, gravid, appropriate for gestational age.  Pain/Pressure: Absent     Pelvic: {Blank single:19197::"Cervical exam performed in the presence of a chaperone","Cervical exam deferred"}        Extremities: Normal range of motion.  Edema: None  Mental Status: Normal mood and affect. Normal behavior. Normal judgment and thought content.   Assessment and Plan:  Pregnancy: [id number] at [redacted]w[redacted]d There are no diagnoses linked to this encounter. {Blank single:19197::"Term","Preterm"} labor symptoms and general obstetric precautions including  but not limited to vaginal bleeding, contractions, leaking of fluid and fetal movement were reviewed in detail with the patient. Please refer to After Visit Summary for other counseling recommendations.   No follow-ups on file.  Future Appointments  Date Time Provider Department Center  02/01/2023 10:15 AM [facility] NURSE [facility]  02/01/2023 10:30 AM [facility] US3 [facility]  02/04/2023  3:20 PM Rema Fendt, NP [facility] None    Lavonda Jumbo, DO

## 2023-01-23 NOTE — Progress Notes (Unsigned)
Pt presents for ROB visit. Declines AFP. No concerns.

## 2023-01-25 LAB — AFP, SERUM, OPEN SPINA BIFIDA
AFP MoM: 0.74
AFP Value: 25.5 ng/mL
Gest. Age on Collection Date: 17 weeks
Maternal Age At EDD: 36.6 yr
OSBR Risk 1 IN: 10000
Test Results:: NEGATIVE
Weight: 209 [lb_av]

## 2023-01-30 ENCOUNTER — Ambulatory Visit: Payer: 59

## 2023-01-30 NOTE — Progress Notes (Signed)
Erroneous encounter-disregard

## 2023-01-31 ENCOUNTER — Encounter: Payer: Self-pay | Admitting: *Deleted

## 2023-02-01 ENCOUNTER — Ambulatory Visit: Payer: Commercial Managed Care - HMO | Admitting: *Deleted

## 2023-02-01 ENCOUNTER — Other Ambulatory Visit: Payer: Self-pay | Admitting: *Deleted

## 2023-02-01 ENCOUNTER — Ambulatory Visit: Payer: Commercial Managed Care - HMO | Attending: Obstetrics and Gynecology

## 2023-02-01 ENCOUNTER — Encounter: Payer: Self-pay | Admitting: *Deleted

## 2023-02-01 VITALS — BP 112/65 | HR 84

## 2023-02-01 DIAGNOSIS — O10012 Pre-existing essential hypertension complicating pregnancy, second trimester: Secondary | ICD-10-CM | POA: Diagnosis not present

## 2023-02-01 DIAGNOSIS — O99212 Obesity complicating pregnancy, second trimester: Secondary | ICD-10-CM | POA: Diagnosis not present

## 2023-02-01 DIAGNOSIS — E119 Type 2 diabetes mellitus without complications: Secondary | ICD-10-CM

## 2023-02-01 DIAGNOSIS — E669 Obesity, unspecified: Secondary | ICD-10-CM

## 2023-02-01 DIAGNOSIS — Z362 Encounter for other antenatal screening follow-up: Secondary | ICD-10-CM

## 2023-02-01 DIAGNOSIS — O09522 Supervision of elderly multigravida, second trimester: Secondary | ICD-10-CM | POA: Insufficient documentation

## 2023-02-01 DIAGNOSIS — O34219 Maternal care for unspecified type scar from previous cesarean delivery: Secondary | ICD-10-CM | POA: Insufficient documentation

## 2023-02-01 DIAGNOSIS — O24112 Pre-existing diabetes mellitus, type 2, in pregnancy, second trimester: Secondary | ICD-10-CM | POA: Diagnosis not present

## 2023-02-01 DIAGNOSIS — O358XX0 Maternal care for other (suspected) fetal abnormality and damage, not applicable or unspecified: Secondary | ICD-10-CM | POA: Insufficient documentation

## 2023-02-01 DIAGNOSIS — Z3A18 18 weeks gestation of pregnancy: Secondary | ICD-10-CM | POA: Insufficient documentation

## 2023-02-01 DIAGNOSIS — O10912 Unspecified pre-existing hypertension complicating pregnancy, second trimester: Secondary | ICD-10-CM

## 2023-02-01 DIAGNOSIS — O0992 Supervision of high risk pregnancy, unspecified, second trimester: Secondary | ICD-10-CM | POA: Diagnosis not present

## 2023-02-01 DIAGNOSIS — O0990 Supervision of high risk pregnancy, unspecified, unspecified trimester: Secondary | ICD-10-CM

## 2023-02-04 ENCOUNTER — Encounter: Payer: Self-pay | Admitting: Family

## 2023-02-04 NOTE — Progress Notes (Unsigned)
Patient ID: Amy Kim, female    DOB: 1986/02/08  MRN: [id number]  CC: No chief complaint on file.   Subjective: Amy Kim is a 37 y.o. female who presents for  Her concerns today include:  Please confirm if patient is currently pregnant and established with OB/GYN. Last appt 02/01/2023. Next appt 02/26/2023.     Patient Active Problem List   Diagnosis Date Noted   Supervision of high risk pregnancy, antepartum 11/23/2022   Iron deficiency anemia due to chronic blood loss 05/30/2021   Class II obesity 08/26/2020   New onset type 2 diabetes mellitus (HCC) 01/13/2020   Thrombocytosis 01/13/2020   Essential hypertension 12/17/2019   History of C-section 03/24/2015     Current Outpatient Medications on File Prior to Visit  Medication Sig Dispense Refill   acetaminophen (TYLENOL) 325 MG tablet Take 2 tablets (650 mg total) by mouth every 6 (six) hours as needed. 36 tablet 0   labetalol (NORMODYNE) 200 MG tablet Take 1 tablet (200 mg total) by mouth 2 (two) times daily. 60 tablet 6   metFORMIN (GLUCOPHAGE) 500 MG tablet Take 1 tablet (500 mg total) by mouth 2 (two) times daily with a meal. (Patient not taking: Reported on 11/23/2022) 60 tablet 2   Prenatal MV & Min w/FA-DHA (PRENATAL GUMMIES) 0.18-25 MG CHEW Chew by mouth.     Current Facility-Administered Medications on File Prior to Visit  Medication Dose Route Frequency Provider Last Rate Last Admin   0.9 %  sodium chloride infusion  500 mL Intravenous Once Imogene Burn, MD       0.9 %  sodium chloride infusion  500 mL Intravenous Once Imogene Burn, MD        Allergies  Allergen Reactions   Aspirin     Hx of bleeding ulcers   Ibuprofen     Hx of bleeding ulcers    Social History   Socioeconomic History   Marital status: Married    Spouse name: Not on file   Number of children: Not on file   Years of education: Not on file   Highest education level: Not on file  Occupational History   Not on file   Tobacco Use   Smoking status: Never    Passive exposure: Never   Smokeless tobacco: Never  Vaping Use   Vaping Use: Never used  Substance and Sexual Activity   Alcohol use: Not Currently    Comment: occasionally   Drug use: No   Sexual activity: Yes    Birth control/protection: Injection    Comment: Medroxyprogesterone   Other Topics Concern   Not on file  Social History Narrative   Not on file   Social Determinants of Health   Financial Resource Strain: Low Risk  (09/11/2018)   Overall Financial Resource Strain (CARDIA)    Difficulty of Paying Living Expenses: Not hard at all  Food Insecurity: No Food Insecurity (09/11/2018)   Hunger Vital Sign    Worried About Running Out of Food in the Last Year: Never true    Ran Out of Food in the Last Year: Never true  Transportation Needs: No Transportation Needs (09/19/2018)   PRAPARE - Administrator, Civil Service (Medical): No    Lack of Transportation (Non-Medical): No  Physical Activity: Not on file  Stress: No Stress Concern Present (09/11/2018)   [employer] of Occupational Health - Occupational Stress Questionnaire    Feeling of Stress : Only a little  Social Connections: Not on file  Intimate Partner Violence: Not At Risk (09/11/2018)   Humiliation, Afraid, Rape, and Kick questionnaire    Fear of Current or Ex-Partner: No    Emotionally Abused: No    Physically Abused: No    Sexually Abused: No    Family History  Problem Relation Age of Onset   Colon polyps Mother    Hypertension Mother    Hypertension Father    Heart disease Paternal Grandmother    Colon cancer Neg Hx    Esophageal cancer Neg Hx    Pancreatic cancer Neg Hx    Stomach cancer Neg Hx    Liver disease Neg Hx    Crohn's disease Neg Hx    Rectal cancer Neg Hx    Ulcerative colitis Neg Hx     Past Surgical History:  Procedure Laterality Date   CESAREAN SECTION  2011   x 4 total   CESAREAN SECTION N/A 08/11/2015    Procedure: REPEAT CESAREAN SECTION;  Surgeon: Levie Heritage, DO;  Location: [facility];  Service: Obstetrics;  Laterality: N/A;   CESAREAN SECTION N/A 09/19/2018   Procedure: CESAREAN SECTION;  Surgeon: Tereso Newcomer, MD;  [facility];  Service: Obstetrics;  Laterality: N/A;   CESAREAN SECTION  2014   CHOLECYSTECTOMY N/A 03/21/2016   Procedure: LAPAROSCOPIC CHOLECYSTECTOMY;  Surgeon: Axel Filler, MD;  [facility];  Service: General;  Laterality: N/A;   COLONOSCOPY     adebocarcinoma,10/22    ROS: Review of Systems Negative except as stated above  PHYSICAL EXAM: LMP 09/22/2022   Physical Exam  {female adult master:310786} {female adult master:310785}     Latest Ref Rng & Units 11/05/2022    3:59 PM 05/08/2022    4:39 PM 09/26/2021    4:14 PM  CMP  Glucose 70 - 99 mg/dL 161  096  75   BUN 6 - 20 mg/dL 15  13  20    Creatinine 0.57 - 1.00 mg/dL 0.45  4.09  8.11   Sodium 134 - 144 mmol/L 137  138  140   Potassium 3.5 - 5.2 mmol/L 3.8  3.8  4.2   Chloride 96 - 106 mmol/L 104  101  101   CO2 20 - 29 mmol/L 18  21  19    Calcium 8.7 - 10.2 mg/dL 9.6  9.3  9.6    Lipid Panel     Component Value Date/Time   CHOL 193 12/31/2019 1058   TRIG 66 12/31/2019 1058   HDL 48 12/31/2019 1058   CHOLHDL 4.0 12/31/2019 1058   LDLCALC 133 (H) 12/31/2019 1058    CBC    Component Value Date/Time   WBC 8.7 11/23/2022 0918   WBC 8.5 10/06/2021 1154   RBC 4.60 11/23/2022 0918   RBC 4.74 10/06/2021 1154   HGB 12.4 11/23/2022 0918   HGB 11.2 01/14/2015 0000   HCT 37.2 11/23/2022 0918   HCT 35 01/14/2015 0000   PLT 480 (H) 11/23/2022 0918   PLT 489 01/14/2015 0000   MCV 81 11/23/2022 0918   MCH 27.0 11/23/2022 0918   MCH 26.7 05/30/2021 0824   MCHC 33.3 11/23/2022 0918   MCHC 32.1 10/06/2021 1154   RDW 12.9 11/23/2022 0918   LYMPHSABS 1.8 11/23/2022 0918   MONOABS 0.4 10/06/2021 1154   EOSABS 0.1 11/23/2022 0918   BASOSABS 0.0 11/23/2022 0918    ASSESSMENT  AND PLAN:  There are no diagnoses linked to this encounter.   Patient  was given the opportunity to ask questions.  Patient verbalized understanding of the plan and was able to repeat key elements of the plan. Patient was given clear instructions to go to Emergency Department or return to medical center if symptoms don't improve, worsen, or new problems develop.The patient verbalized understanding.   No orders of the defined types were placed in this encounter.    Requested Prescriptions    No prescriptions requested or ordered in this encounter    No follow-ups on file.  Camillia Herter, NP

## 2023-02-05 ENCOUNTER — Encounter: Payer: Self-pay | Admitting: Family

## 2023-02-05 ENCOUNTER — Telehealth: Payer: Self-pay | Admitting: Family

## 2023-02-05 ENCOUNTER — Ambulatory Visit (INDEPENDENT_AMBULATORY_CARE_PROVIDER_SITE_OTHER): Payer: 59 | Admitting: Family

## 2023-02-05 VITALS — BP 98/66 | HR 84 | Temp 98.2°F | Resp 20 | Wt 208.0 lb

## 2023-02-05 DIAGNOSIS — O161 Unspecified maternal hypertension, first trimester: Secondary | ICD-10-CM | POA: Diagnosis not present

## 2023-02-05 DIAGNOSIS — Z3A19 19 weeks gestation of pregnancy: Secondary | ICD-10-CM

## 2023-02-05 DIAGNOSIS — Z369 Encounter for antenatal screening, unspecified: Secondary | ICD-10-CM | POA: Diagnosis not present

## 2023-02-05 DIAGNOSIS — O169 Unspecified maternal hypertension, unspecified trimester: Secondary | ICD-10-CM

## 2023-02-05 NOTE — Progress Notes (Signed)
HTN [redacted] weeks gestation

## 2023-02-26 ENCOUNTER — Encounter: Payer: 59 | Admitting: Obstetrics and Gynecology

## 2023-03-05 ENCOUNTER — Ambulatory Visit (INDEPENDENT_AMBULATORY_CARE_PROVIDER_SITE_OTHER): Payer: 59 | Admitting: Certified Nurse Midwife

## 2023-03-05 ENCOUNTER — Encounter: Payer: Self-pay | Admitting: Certified Nurse Midwife

## 2023-03-05 VITALS — BP 100/61 | HR 75 | Wt 204.0 lb

## 2023-03-05 DIAGNOSIS — O0992 Supervision of high risk pregnancy, unspecified, second trimester: Secondary | ICD-10-CM

## 2023-03-05 DIAGNOSIS — Z3A23 23 weeks gestation of pregnancy: Secondary | ICD-10-CM

## 2023-03-05 DIAGNOSIS — R42 Dizziness and giddiness: Secondary | ICD-10-CM

## 2023-03-05 DIAGNOSIS — I10 Essential (primary) hypertension: Secondary | ICD-10-CM

## 2023-03-05 DIAGNOSIS — O24112 Pre-existing diabetes mellitus, type 2, in pregnancy, second trimester: Secondary | ICD-10-CM

## 2023-03-05 NOTE — Progress Notes (Signed)
PRENATAL VISIT NOTE  Subjective:  Amy Kim is a 37 y.o. [id number] at [redacted]w[redacted]d being seen today for ongoing prenatal care.  She is currently monitored for the following issues for this high-risk pregnancy and has History of C-section; Essential hypertension; New onset type 2 diabetes mellitus (HCC); Thrombocytosis; Class II obesity; Iron deficiency anemia due to chronic blood loss; and Supervision of high risk pregnancy, antepartum on their problem list.  Patient reports  dizzy spells "feeling like she is going to pass out especially at work."  .  Contractions: Not present. Vag. Bleeding: None.  Movement: Present. Denies leaking of fluid.   The following portions of the patient's history were reviewed and updated as appropriate: allergies, current medications, past family history, past medical history, past social history, past surgical history and problem list.   Objective:   Vitals:   03/05/23 1326  BP: 100/61  Pulse: 75  Weight: 204 lb (92.5 kg)    Fetal Status: Fetal Heart Rate (bpm): 150   Movement: Present     General:  Alert, oriented and cooperative. Patient is in no acute distress.  Skin: Skin is warm and dry. No rash noted.   Cardiovascular: Normal heart rate noted  Respiratory: Normal respiratory effort, no problems with respiration noted  Abdomen: Soft, gravid, appropriate for gestational age.  Pain/Pressure: Absent     Pelvic: Cervical exam deferred        Extremities: Normal range of motion.  Edema: None  Mental Status: Normal mood and affect. Normal behavior. Normal judgment and thought content.   Assessment and Plan:  Pregnancy: [id number] at [redacted]w[redacted]d 1. Supervision of high risk pregnancy in second trimester - Patient doing well. She reports frequent and vigorous fetal movement.   2. Type 2 diabetes mellitus affecting pregnancy in second trimester, antepartum - Patient reports that she is not taking anything and states that she has been diet controlled for the last  5 years. She also states that she has not checked her sugar in weeks.  - Strong recommendation to check glucose as previously recommended.  - 27cm fundal height > dates today. Follow up US with growth to be completed in 2 weeks.  - GTT at next visit. Reviewed dietary restrictions for the night prior to visit.   3. Chronic hypertension - BPs stable in office today.  - Continue with current regimen.   4. [redacted] weeks gestation of pregnancy - Reviewed 2nd trimester expectations   5. Dizziness - Through discussion patient admitted to not eating throughout the day. She states that she may have something like "a bag of chips" on the way to work then not eat again until way later in the evening.  - Recommended to pack high protein grab'n'go (like nuts) snacks that she can eat throughout the day to keep energy up.  - Also recommended to increase water intake.  - Patient verbalized understanding   Preterm labor symptoms and general obstetric precautions including but not limited to vaginal bleeding, contractions, leaking of fluid and fetal movement were reviewed in detail with the patient. Please refer to After Visit Summary for other counseling recommendations.   Return in about 4 weeks (around 04/02/2023) for HROB w GTT.  Future Appointments  Date Time Provider Department Center  03/15/2023  3:30 PM [facility] NURSE [facility]  03/15/2023  3:45 PM [facility] US4 [facility]  04/02/2023  8:55 AM Warden Fillers, MD [facility] None  04/02/2023  9:30 AM [facility] LAB [facility] None  04/24/2023  9:35 AM  Lennart Pall, MD [facility] None    Barrie Wale ([name]) Suzie Portela, MSN, CNM  [facility]  03/05/2023 2:02 PM

## 2023-03-05 NOTE — Progress Notes (Signed)
Pt presents for ROB visit. Pt c/o dizzy spells a few times a weeks. No other concerns.

## 2023-03-15 ENCOUNTER — Ambulatory Visit: Payer: Commercial Managed Care - HMO | Attending: Maternal & Fetal Medicine

## 2023-03-15 ENCOUNTER — Ambulatory Visit: Payer: Commercial Managed Care - HMO | Admitting: *Deleted

## 2023-03-15 ENCOUNTER — Other Ambulatory Visit: Payer: Self-pay | Admitting: *Deleted

## 2023-03-15 VITALS — BP 127/73 | HR 82

## 2023-03-15 DIAGNOSIS — O09522 Supervision of elderly multigravida, second trimester: Secondary | ICD-10-CM | POA: Insufficient documentation

## 2023-03-15 DIAGNOSIS — O0990 Supervision of high risk pregnancy, unspecified, unspecified trimester: Secondary | ICD-10-CM | POA: Insufficient documentation

## 2023-03-15 DIAGNOSIS — O10012 Pre-existing essential hypertension complicating pregnancy, second trimester: Secondary | ICD-10-CM

## 2023-03-15 DIAGNOSIS — O10912 Unspecified pre-existing hypertension complicating pregnancy, second trimester: Secondary | ICD-10-CM | POA: Diagnosis present

## 2023-03-15 DIAGNOSIS — O24112 Pre-existing diabetes mellitus, type 2, in pregnancy, second trimester: Secondary | ICD-10-CM | POA: Insufficient documentation

## 2023-03-15 DIAGNOSIS — Z7984 Long term (current) use of oral hypoglycemic drugs: Secondary | ICD-10-CM | POA: Diagnosis not present

## 2023-03-15 DIAGNOSIS — Z362 Encounter for other antenatal screening follow-up: Secondary | ICD-10-CM | POA: Insufficient documentation

## 2023-03-15 DIAGNOSIS — O99212 Obesity complicating pregnancy, second trimester: Secondary | ICD-10-CM | POA: Insufficient documentation

## 2023-03-15 DIAGNOSIS — O34219 Maternal care for unspecified type scar from previous cesarean delivery: Secondary | ICD-10-CM | POA: Insufficient documentation

## 2023-03-15 DIAGNOSIS — Z3A24 24 weeks gestation of pregnancy: Secondary | ICD-10-CM

## 2023-03-15 DIAGNOSIS — E669 Obesity, unspecified: Secondary | ICD-10-CM

## 2023-04-02 ENCOUNTER — Other Ambulatory Visit: Payer: Self-pay | Admitting: Certified Nurse Midwife

## 2023-04-02 ENCOUNTER — Other Ambulatory Visit: Payer: Commercial Managed Care - HMO

## 2023-04-02 ENCOUNTER — Ambulatory Visit (INDEPENDENT_AMBULATORY_CARE_PROVIDER_SITE_OTHER): Payer: Commercial Managed Care - HMO | Admitting: Obstetrics and Gynecology

## 2023-04-02 ENCOUNTER — Encounter: Payer: Commercial Managed Care - HMO | Admitting: Obstetrics and Gynecology

## 2023-04-02 VITALS — BP 112/74 | HR 77 | Wt 209.0 lb

## 2023-04-02 DIAGNOSIS — Z98891 History of uterine scar from previous surgery: Secondary | ICD-10-CM

## 2023-04-02 DIAGNOSIS — Z3A27 27 weeks gestation of pregnancy: Secondary | ICD-10-CM

## 2023-04-02 DIAGNOSIS — E119 Type 2 diabetes mellitus without complications: Secondary | ICD-10-CM

## 2023-04-02 DIAGNOSIS — O0990 Supervision of high risk pregnancy, unspecified, unspecified trimester: Secondary | ICD-10-CM

## 2023-04-02 DIAGNOSIS — O0992 Supervision of high risk pregnancy, unspecified, second trimester: Secondary | ICD-10-CM

## 2023-04-02 DIAGNOSIS — Z531 Procedure and treatment not carried out because of patient's decision for reasons of belief and group pressure: Secondary | ICD-10-CM

## 2023-04-02 LAB — GLUCOSE, POCT (MANUAL RESULT ENTRY): POC Glucose: 133 mg/dl — AB (ref 70–99)

## 2023-04-02 NOTE — Progress Notes (Signed)
HROB, Pt is non complaint iin checking her BS and is not taking Rx.    CBG today is 133.  Diabetic supplies; 1 Glucometer, 400 Test Strips and 400 Lancets given to Pt 04/02/23

## 2023-04-02 NOTE — Progress Notes (Signed)
   PRENATAL VISIT NOTE  Subjective:  Amy Kim is a 37 y.o. W1X9147 at [redacted]w[redacted]d being seen today for ongoing prenatal care.  She is currently monitored for the following issues for this high-risk pregnancy and has history of c section x 4; Essential hypertension; New onset type 2 diabetes mellitus (HCC); Thrombocytosis; Class II obesity; Iron deficiency anemia due to chronic blood loss; and Supervision of high risk pregnancy, antepartum on their problem list.  Patient doing well with no acute concerns today. She reports no complaints.  Contractions: Not present. Vag. Bleeding: None.  Movement: Present. Denies leaking of fluid.   Multiple concerns noted from MD standpoint.  Pt is a preexisting type 2 diabetic.  She has not really been checking her blood sugars or following up for treatment.  MD emphasized good blood sugar control is necessary to optimize pregnancy outcomes.  This will also be a 5 peat c section and the patient is a Jehovah's Witness.  She will not accept any blood products.  Discussed increased risk of complications due to high order cesarean section, but the patient did not seem convinced.  She is ambivalent regarding tubal ligation even after concerns discussed regarding future pregnancies.  The following portions of the patient's history were reviewed and updated as appropriate: allergies, current medications, past family history, past medical history, past social history, past surgical history and problem list. Problem list updated.  Objective:   Vitals:   04/02/23 0900  BP: 112/74  Pulse: 77  Weight: 209 lb (94.8 kg)    Fetal Status: Fetal Heart Rate (bpm): 143 Fundal Height: 27 cm Movement: Present     General:  Alert, oriented and cooperative. Patient is in no acute distress.  Skin: Skin is warm and dry. No rash noted.   Cardiovascular: Normal heart rate noted  Respiratory: Normal respiratory effort, no problems with respiration noted  Abdomen: Soft, gravid,  appropriate for gestational age.  Pain/Pressure: Absent     Pelvic: Cervical exam deferred        Extremities: Normal range of motion.  Edema: None  Mental Status:  Normal mood and affect. Normal behavior. Normal judgment and thought content.   Assessment and Plan:  Pregnancy: [id number] at [redacted]w[redacted]d  1. [redacted] weeks gestation of pregnancy   2. New onset type 2 diabetes mellitus (HCC) Postprandial blood sugar was 133 today We have ordered new monitor  Recheck blood sugars in 2 weeks, may need to transition pt to metformin or insulin if blood sugars are elevated - POCT Glucose (CBG)  3. Supervision of high risk pregnancy, antepartum Continue routine prenatal care  - RPR - CBC - HIV antibody (with reflex)  4. History of C-section History of c section x 4, pt will need two MD for delivery and cell saver due to jehovah's witness status, need clarification regarding tubal ligation  Preterm labor symptoms and general obstetric precautions including but not limited to vaginal bleeding, contractions, leaking of fluid and fetal movement were reviewed in detail with the patient.  Please refer to After Visit Summary for other counseling recommendations.   Return in about 2 weeks (around 04/16/2023) for [facility], in person.   Mariel Aloe, MD Faculty Attending Center for [facility]

## 2023-04-03 LAB — CBC
Hematocrit: 34.9 % (ref 34.0–46.6)
Hemoglobin: 11.3 g/dL (ref 11.1–15.9)
MCH: 27 pg (ref 26.6–33.0)
MCHC: 32.4 g/dL (ref 31.5–35.7)
MCV: 84 fL (ref 79–97)
Platelets: 444 10*3/uL (ref 150–450)
RBC: 4.18 x10E6/uL (ref 3.77–5.28)
RDW: 13.5 % (ref 11.7–15.4)
WBC: 10.1 10*3/uL (ref 3.4–10.8)

## 2023-04-03 LAB — HIV ANTIBODY (ROUTINE TESTING W REFLEX): HIV Screen 4th Generation wRfx: NONREACTIVE

## 2023-04-03 LAB — RPR: RPR Ser Ql: NONREACTIVE

## 2023-04-19 ENCOUNTER — Other Ambulatory Visit: Payer: Self-pay | Admitting: *Deleted

## 2023-04-19 ENCOUNTER — Ambulatory Visit: Payer: Commercial Managed Care - HMO | Admitting: *Deleted

## 2023-04-19 ENCOUNTER — Ambulatory Visit: Payer: Commercial Managed Care - HMO | Attending: Obstetrics and Gynecology

## 2023-04-19 VITALS — BP 120/68 | HR 92

## 2023-04-19 DIAGNOSIS — O10912 Unspecified pre-existing hypertension complicating pregnancy, second trimester: Secondary | ICD-10-CM | POA: Insufficient documentation

## 2023-04-19 DIAGNOSIS — E669 Obesity, unspecified: Secondary | ICD-10-CM

## 2023-04-19 DIAGNOSIS — O24113 Pre-existing diabetes mellitus, type 2, in pregnancy, third trimester: Secondary | ICD-10-CM

## 2023-04-19 DIAGNOSIS — O09523 Supervision of elderly multigravida, third trimester: Secondary | ICD-10-CM

## 2023-04-19 DIAGNOSIS — O09522 Supervision of elderly multigravida, second trimester: Secondary | ICD-10-CM | POA: Insufficient documentation

## 2023-04-19 DIAGNOSIS — O0990 Supervision of high risk pregnancy, unspecified, unspecified trimester: Secondary | ICD-10-CM | POA: Diagnosis present

## 2023-04-19 DIAGNOSIS — O34219 Maternal care for unspecified type scar from previous cesarean delivery: Secondary | ICD-10-CM | POA: Insufficient documentation

## 2023-04-19 DIAGNOSIS — O24112 Pre-existing diabetes mellitus, type 2, in pregnancy, second trimester: Secondary | ICD-10-CM | POA: Insufficient documentation

## 2023-04-19 DIAGNOSIS — E1169 Type 2 diabetes mellitus with other specified complication: Secondary | ICD-10-CM

## 2023-04-19 DIAGNOSIS — O99212 Obesity complicating pregnancy, second trimester: Secondary | ICD-10-CM | POA: Diagnosis present

## 2023-04-19 DIAGNOSIS — O10913 Unspecified pre-existing hypertension complicating pregnancy, third trimester: Secondary | ICD-10-CM

## 2023-04-19 DIAGNOSIS — Z7984 Long term (current) use of oral hypoglycemic drugs: Secondary | ICD-10-CM | POA: Diagnosis not present

## 2023-04-19 DIAGNOSIS — O10013 Pre-existing essential hypertension complicating pregnancy, third trimester: Secondary | ICD-10-CM

## 2023-04-19 DIAGNOSIS — Z3A29 29 weeks gestation of pregnancy: Secondary | ICD-10-CM

## 2023-04-19 DIAGNOSIS — Z531 Procedure and treatment not carried out because of patient's decision for reasons of belief and group pressure: Secondary | ICD-10-CM | POA: Insufficient documentation

## 2023-04-24 ENCOUNTER — Ambulatory Visit: Payer: Commercial Managed Care - HMO | Attending: Maternal & Fetal Medicine | Admitting: *Deleted

## 2023-04-24 ENCOUNTER — Ambulatory Visit: Payer: Medicaid Other | Admitting: Obstetrics and Gynecology

## 2023-04-24 ENCOUNTER — Ambulatory Visit: Payer: Commercial Managed Care - HMO

## 2023-04-24 VITALS — BP 110/62 | HR 93

## 2023-04-24 VITALS — BP 103/71 | HR 85 | Wt 205.0 lb

## 2023-04-24 DIAGNOSIS — Z98891 History of uterine scar from previous surgery: Secondary | ICD-10-CM

## 2023-04-24 DIAGNOSIS — I10 Essential (primary) hypertension: Secondary | ICD-10-CM

## 2023-04-24 DIAGNOSIS — Z531 Procedure and treatment not carried out because of patient's decision for reasons of belief and group pressure: Secondary | ICD-10-CM

## 2023-04-24 DIAGNOSIS — E119 Type 2 diabetes mellitus without complications: Secondary | ICD-10-CM

## 2023-04-24 DIAGNOSIS — O0990 Supervision of high risk pregnancy, unspecified, unspecified trimester: Secondary | ICD-10-CM

## 2023-04-24 DIAGNOSIS — O99213 Obesity complicating pregnancy, third trimester: Secondary | ICD-10-CM | POA: Diagnosis not present

## 2023-04-24 DIAGNOSIS — O24113 Pre-existing diabetes mellitus, type 2, in pregnancy, third trimester: Secondary | ICD-10-CM | POA: Diagnosis not present

## 2023-04-24 DIAGNOSIS — O10913 Unspecified pre-existing hypertension complicating pregnancy, third trimester: Secondary | ICD-10-CM | POA: Insufficient documentation

## 2023-04-24 DIAGNOSIS — D500 Iron deficiency anemia secondary to blood loss (chronic): Secondary | ICD-10-CM

## 2023-04-24 DIAGNOSIS — O09523 Supervision of elderly multigravida, third trimester: Secondary | ICD-10-CM | POA: Insufficient documentation

## 2023-04-24 DIAGNOSIS — Z3A30 30 weeks gestation of pregnancy: Secondary | ICD-10-CM | POA: Diagnosis not present

## 2023-04-24 MED ORDER — PEN NEEDLES 33G X 4 MM MISC: 5 refills | Status: DC

## 2023-04-24 MED ORDER — INSULIN GLARGINE 100 UNIT/ML SOLOSTAR PEN: 6.0000 [IU] | PEN_INJECTOR | Freq: Two times a day (BID) | SUBCUTANEOUS | 0 refills | Status: DC

## 2023-04-24 NOTE — Procedures (Signed)
Amy Kim 04/14/1986 [redacted]w[redacted]d  Fetus A Non-Stress Test Interpretation for 04/24/23--NST ONLY  Indication: Chronic Hypertenstion, Diabetes, and obese  Fetal Heart Rate A Mode: External Baseline Rate (A): 140 bpm Variability: Moderate Accelerations: 10 x 10 Decelerations: None Multiple birth?: No  Uterine Activity Mode: Toco Contraction Frequency (min): none Resting Tone Palpated: Relaxed  Interpretation (Fetal Testing) Nonstress Test Interpretation: Reactive Comments: Tracing reviewed byDr. Parke Poisson

## 2023-04-24 NOTE — Progress Notes (Signed)
   PRENATAL VISIT NOTE  Subjective:  Amy Kim is a 37 y.o. M5H8469 at [redacted]w[redacted]d being seen today for ongoing prenatal care.  She is currently monitored for the following issues for this high-risk pregnancy and has history of c section x 4; Refusal of blood transfusions as patient is Jehovah's Witness; Essential hypertension; New onset type 2 diabetes mellitus (HCC); Thrombocytosis; and Supervision of high risk pregnancy, antepartum on their problem list.  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  Contractions: Irregular. Vag. Bleeding: None.  Movement: Present. Denies leaking of fluid.   The following portions of the patient's history were reviewed and updated as appropriate: allergies, current medications, past family history, past medical history, past social history, past surgical history and problem list.   Objective:   Vitals:   04/24/23 0924  BP: 103/71  Pulse: 85  Weight: 205 lb (93 kg)   Fetal Status: Fetal Heart Rate (bpm): 155   Movement: Present     General:  Alert, oriented and cooperative. Patient is in no acute distress.  Skin: Skin is warm and dry. No rash noted.   Cardiovascular: Normal heart rate noted  Respiratory: Normal respiratory effort, no problems with respiration noted  Abdomen: Soft, gravid, appropriate for gestational age.  Pain/Pressure: Present      Assessment and Plan:  Pregnancy: G2X5284 at [redacted]w[redacted]d 1. Supervision of high risk pregnancy, antepartum 2. [redacted] weeks gestation of pregnancy  3. New onset type 2 diabetes mellitus (HCC) Reports fasting 110-130s, postprandial 130-150s+ Previously declined metformin due to concerns about crossing placenta Will start lantus 6u BID and titrate prn Normal fetal echo Growth [place] @ 29/6 1305g (12%), AC 23%, anterior, cephalic, 15.26 Given EFW at 12%ile, planned for weekly NSTs and follow up growth on 8/16  4. history of c  section x 4 5. Refusal of blood transfusions as patient is Jehovah's Witness Op note from last section reviewed - 6cm uterine window noted, difficult entry but minimal intraperitoneal adhesive disease Will need MD & cell saver (if pt approves) Timing of delivery to be determined by pt's medical problems (c/f developing FGR, uncontrolled T2DM) - anticipate around 37wk, but will schedule as we get closer  6. Essential hypertension Normotensive Taking labetalol 200mg  daily (qAM). Discussed BID dosing and checking PM BP  Normal baseline Cr, will collect a "baseline" PCR next appt  7. Iron deficiency anemia due to chronic blood loss Resolved, /3\ Hgb 11.3  Please refer to After Visit Summary for other counseling recommendations.   Return in about 2 weeks (around 05/08/2023).  Future Appointments  Date Time Provider Department Center  04/24/2023 10:30 AM [facility] NURSE [facility]  04/24/2023 10:45 AM [facility] NST [facility]  05/03/2023  9:30 AM [facility] NURSE [facility]  05/03/2023  9:45 AM [facility] NST [facility]  05/09/2023  8:35 AM Hermina Staggers, MD [facility] None  05/10/2023  8:30 AM [facility] NURSE [facility]  05/10/2023  8:45 AM [facility] US4 [facility]  05/17/2023  8:30 AM [facility] NURSE [facility]  05/17/2023  8:45 AM [facility] NST [facility]  05/23/2023 10:15 AM Constant, Peggy, MD [facility] None  05/24/2023  8:30 AM [facility] NURSE [facility]  05/24/2023  8:45 AM [facility] NST [facility]  06/06/2023  9:35 AM Warden Fillers, MD [facility] None   Lennart Pall, MD

## 2023-04-24 NOTE — Progress Notes (Signed)
Pt needs to discuss latest u/s and growth.  Pt states that MFM recommends insulin and she discuss work with provider. Pt has appt today with MFM for NST.  Pt checked glucose this morning and fasting was 129.

## 2023-04-24 NOTE — Addendum Note (Signed)
Addended by: Harvie Bridge on: 04/24/2023 04:50 PM   Modules accepted: Orders

## 2023-05-03 ENCOUNTER — Ambulatory Visit: Payer: Commercial Managed Care - HMO | Admitting: *Deleted

## 2023-05-03 ENCOUNTER — Ambulatory Visit: Payer: Commercial Managed Care - HMO | Attending: Maternal & Fetal Medicine | Admitting: *Deleted

## 2023-05-03 VITALS — BP 118/63 | HR 98

## 2023-05-03 DIAGNOSIS — O0990 Supervision of high risk pregnancy, unspecified, unspecified trimester: Secondary | ICD-10-CM

## 2023-05-03 DIAGNOSIS — O99213 Obesity complicating pregnancy, third trimester: Secondary | ICD-10-CM | POA: Insufficient documentation

## 2023-05-03 DIAGNOSIS — O24113 Pre-existing diabetes mellitus, type 2, in pregnancy, third trimester: Secondary | ICD-10-CM | POA: Diagnosis not present

## 2023-05-03 DIAGNOSIS — O09523 Supervision of elderly multigravida, third trimester: Secondary | ICD-10-CM

## 2023-05-03 DIAGNOSIS — O10913 Unspecified pre-existing hypertension complicating pregnancy, third trimester: Secondary | ICD-10-CM

## 2023-05-03 DIAGNOSIS — Z3A31 31 weeks gestation of pregnancy: Secondary | ICD-10-CM | POA: Insufficient documentation

## 2023-05-03 NOTE — Procedures (Signed)
Amy Kim 05-Jun-1986 [redacted]w[redacted]d  Fetus A Non-Stress Test Interpretation for 05/03/23-NST only  Indication: Chronic Hypertenstion, Diabetes, and Advanced Maternal Age >40 years  Fetal Heart Rate A Mode: External Baseline Rate (A): 135 bpm Variability: Moderate Accelerations: 15 x 15 Decelerations: None Multiple birth?: No  Uterine Activity Mode: Toco Contraction Frequency (min): none Resting Tone Palpated: Relaxed  Interpretation (Fetal Testing) Nonstress Test Interpretation: Reactive Comments: Tracing reviewed by dr. Judeth Cornfield

## 2023-05-09 ENCOUNTER — Encounter: Payer: Self-pay | Admitting: Obstetrics and Gynecology

## 2023-05-09 ENCOUNTER — Ambulatory Visit (INDEPENDENT_AMBULATORY_CARE_PROVIDER_SITE_OTHER): Payer: Commercial Managed Care - HMO | Admitting: Obstetrics and Gynecology

## 2023-05-09 VITALS — BP 116/73 | HR 84 | Wt 205.7 lb

## 2023-05-09 DIAGNOSIS — O0990 Supervision of high risk pregnancy, unspecified, unspecified trimester: Secondary | ICD-10-CM

## 2023-05-09 DIAGNOSIS — Z23 Encounter for immunization: Secondary | ICD-10-CM | POA: Diagnosis not present

## 2023-05-09 DIAGNOSIS — Z3A32 32 weeks gestation of pregnancy: Secondary | ICD-10-CM

## 2023-05-09 DIAGNOSIS — O10013 Pre-existing essential hypertension complicating pregnancy, third trimester: Secondary | ICD-10-CM

## 2023-05-09 DIAGNOSIS — I10 Essential (primary) hypertension: Secondary | ICD-10-CM

## 2023-05-09 DIAGNOSIS — Z531 Procedure and treatment not carried out because of patient's decision for reasons of belief and group pressure: Secondary | ICD-10-CM

## 2023-05-09 DIAGNOSIS — Z98891 History of uterine scar from previous surgery: Secondary | ICD-10-CM

## 2023-05-09 DIAGNOSIS — O0993 Supervision of high risk pregnancy, unspecified, third trimester: Secondary | ICD-10-CM

## 2023-05-09 DIAGNOSIS — E119 Type 2 diabetes mellitus without complications: Secondary | ICD-10-CM

## 2023-05-09 DIAGNOSIS — IMO0001 Reserved for inherently not codable concepts without codable children: Secondary | ICD-10-CM

## 2023-05-09 DIAGNOSIS — O24419 Gestational diabetes mellitus in pregnancy, unspecified control: Secondary | ICD-10-CM

## 2023-05-09 NOTE — Progress Notes (Signed)
Subjective:  Jinna Vaynshteyn is a 37 y.o. U9W1191 at [redacted]w[redacted]d being seen today for ongoing prenatal care.  She is currently monitored for the following issues for this high-risk pregnancy and has history of c section x 4; Refusal of blood transfusions as patient is Jehovah's Witness; Essential hypertension; New onset type 2 diabetes mellitus (HCC); Thrombocytosis; and Supervision of high risk pregnancy, antepartum on their problem list.  Patient reports  general discomforts of pregnancy .  Contractions: Irritability. Vag. Bleeding: None.  Movement: Present. Denies leaking of fluid.   The following portions of the patient's history were reviewed and updated as appropriate: allergies, current medications, past family history, past medical history, past social history, past surgical history and problem list. Problem list updated.  Objective:   Vitals:   05/09/23 0839  BP: 116/73  Pulse: 84  Weight: 205 lb 11.2 oz (93.3 kg)    Fetal Status: Fetal Heart Rate (bpm): 140 Fundal Height: 32 cm Movement: Present     General:  Alert, oriented and cooperative. Patient is in no acute distress.  Skin: Skin is warm and dry. No rash noted.   Cardiovascular: Normal heart rate noted  Respiratory: Normal respiratory effort, no problems with respiration noted  Abdomen: Soft, gravid, appropriate for gestational age. Pain/Pressure: Present     Pelvic:  Cervical exam deferred        Extremities: Normal range of motion.  Edema: None  Mental Status: Normal mood and affect. Normal behavior. Normal judgment and thought content.   Urinalysis:      Assessment and Plan:  Pregnancy: Y7W2956 at [redacted]w[redacted]d  1. Supervision of high risk pregnancy, antepartum Stable Tdap today  2. history of c section x 4 For repeat at 37 -39 weeks as per maternal/fetal indications See details for additional information  3. Refusal of blood transfusions as patient is Jehovah's Witness Consider cell saver at time of surgery  4.  Essential hypertension BP stable Serial growth scans and antenatal testing  - Protein / creatinine ratio, urine; Future - Comp Met (CMET); Future  5. New onset type 2 diabetes mellitus (HCC) Has not been able to pick up insulin, due to insurance problems. Pt had [employer] in the past, inactive now. Medicaid pregnancy now. Pt advised to talk to case worker. Serial growth scans and antenatal testing as above - Protein / creatinine ratio, urine; Future - Comp Met (CMET); Future - Hemoglobin A1c; Future  Preterm labor symptoms and general obstetric precautions including but not limited to vaginal bleeding, contractions, leaking of fluid and fetal movement were reviewed in detail with the patient. Please refer to After Visit Summary for other counseling recommendations.  Return in about 2 weeks (around 05/23/2023) for OB visit, face to face, MD only.   Hermina Staggers, MD

## 2023-05-09 NOTE — Patient Instructions (Signed)

## 2023-05-09 NOTE — Progress Notes (Signed)
Pt reports fetal movement with some pressure and uterine irritability. Pt states that she is having issues with getting her insulin due to insurance coverage. Pt reports fasting BG today was 152

## 2023-05-10 ENCOUNTER — Other Ambulatory Visit: Payer: Self-pay | Admitting: *Deleted

## 2023-05-10 ENCOUNTER — Other Ambulatory Visit: Payer: Self-pay | Admitting: Obstetrics and Gynecology

## 2023-05-10 ENCOUNTER — Ambulatory Visit: Payer: Commercial Managed Care - HMO | Admitting: *Deleted

## 2023-05-10 ENCOUNTER — Ambulatory Visit: Payer: Commercial Managed Care - HMO

## 2023-05-10 VITALS — BP 112/65 | HR 92

## 2023-05-10 DIAGNOSIS — Z531 Procedure and treatment not carried out because of patient's decision for reasons of belief and group pressure: Secondary | ICD-10-CM

## 2023-05-10 DIAGNOSIS — Z7984 Long term (current) use of oral hypoglycemic drugs: Secondary | ICD-10-CM

## 2023-05-10 DIAGNOSIS — E669 Obesity, unspecified: Secondary | ICD-10-CM

## 2023-05-10 DIAGNOSIS — O99212 Obesity complicating pregnancy, second trimester: Secondary | ICD-10-CM | POA: Insufficient documentation

## 2023-05-10 DIAGNOSIS — O10912 Unspecified pre-existing hypertension complicating pregnancy, second trimester: Secondary | ICD-10-CM | POA: Diagnosis present

## 2023-05-10 DIAGNOSIS — O09522 Supervision of elderly multigravida, second trimester: Secondary | ICD-10-CM | POA: Diagnosis not present

## 2023-05-10 DIAGNOSIS — O365930 Maternal care for other known or suspected poor fetal growth, third trimester, not applicable or unspecified: Secondary | ICD-10-CM | POA: Diagnosis not present

## 2023-05-10 DIAGNOSIS — O09523 Supervision of elderly multigravida, third trimester: Secondary | ICD-10-CM | POA: Diagnosis present

## 2023-05-10 DIAGNOSIS — O34219 Maternal care for unspecified type scar from previous cesarean delivery: Secondary | ICD-10-CM

## 2023-05-10 DIAGNOSIS — O0990 Supervision of high risk pregnancy, unspecified, unspecified trimester: Secondary | ICD-10-CM

## 2023-05-10 DIAGNOSIS — O10013 Pre-existing essential hypertension complicating pregnancy, third trimester: Secondary | ICD-10-CM | POA: Diagnosis not present

## 2023-05-10 DIAGNOSIS — O99213 Obesity complicating pregnancy, third trimester: Secondary | ICD-10-CM

## 2023-05-10 DIAGNOSIS — O24112 Pre-existing diabetes mellitus, type 2, in pregnancy, second trimester: Secondary | ICD-10-CM

## 2023-05-10 DIAGNOSIS — O24113 Pre-existing diabetes mellitus, type 2, in pregnancy, third trimester: Secondary | ICD-10-CM | POA: Insufficient documentation

## 2023-05-10 DIAGNOSIS — O10913 Unspecified pre-existing hypertension complicating pregnancy, third trimester: Secondary | ICD-10-CM | POA: Insufficient documentation

## 2023-05-10 DIAGNOSIS — E119 Type 2 diabetes mellitus without complications: Secondary | ICD-10-CM

## 2023-05-10 DIAGNOSIS — O365990 Maternal care for other known or suspected poor fetal growth, unspecified trimester, not applicable or unspecified: Secondary | ICD-10-CM | POA: Insufficient documentation

## 2023-05-10 DIAGNOSIS — Z3A32 32 weeks gestation of pregnancy: Secondary | ICD-10-CM

## 2023-05-10 NOTE — Procedures (Signed)
Amy Kim 10-09-1985 [redacted]w[redacted]d  Fetus A Non-Stress Test Interpretation for 05/10/23  Indication: IUGR  Fetal Heart Rate A Mode: External Baseline Rate (A): 140 bpm Variability: Moderate Accelerations: 15 x 15 Decelerations: None Multiple birth?: No  Uterine Activity Mode: Toco Contraction Frequency (min): none Resting Tone Palpated: Relaxed  Interpretation (Fetal Testing) Nonstress Test Interpretation: Reactive Comments: Tracing reviewed by Dr. Judeth Cornfield

## 2023-05-14 ENCOUNTER — Other Ambulatory Visit: Payer: Self-pay | Admitting: *Deleted

## 2023-05-14 DIAGNOSIS — O365930 Maternal care for other known or suspected poor fetal growth, third trimester, not applicable or unspecified: Secondary | ICD-10-CM

## 2023-05-16 ENCOUNTER — Inpatient Hospital Stay (HOSPITAL_COMMUNITY)
Admission: AD | Admit: 2023-05-16 | Discharge: 2023-05-16 | Disposition: A | Discharge status: Home / Self Care | Payer: Commercial Managed Care - HMO | Attending: Obstetrics & Gynecology | Admitting: Obstetrics & Gynecology

## 2023-05-16 ENCOUNTER — Encounter (HOSPITAL_COMMUNITY): Payer: Self-pay | Admitting: Obstetrics & Gynecology

## 2023-05-16 ENCOUNTER — Ambulatory Visit (INDEPENDENT_AMBULATORY_CARE_PROVIDER_SITE_OTHER): Payer: Commercial Managed Care - HMO | Admitting: Obstetrics and Gynecology

## 2023-05-16 ENCOUNTER — Encounter: Payer: Self-pay | Admitting: Obstetrics and Gynecology

## 2023-05-16 VITALS — BP 129/86 | HR 88 | Wt 209.0 lb

## 2023-05-16 DIAGNOSIS — R519 Headache, unspecified: Secondary | ICD-10-CM | POA: Insufficient documentation

## 2023-05-16 DIAGNOSIS — Z3A33 33 weeks gestation of pregnancy: Secondary | ICD-10-CM

## 2023-05-16 DIAGNOSIS — O26893 Other specified pregnancy related conditions, third trimester: Secondary | ICD-10-CM | POA: Diagnosis not present

## 2023-05-16 DIAGNOSIS — O10013 Pre-existing essential hypertension complicating pregnancy, third trimester: Secondary | ICD-10-CM | POA: Diagnosis not present

## 2023-05-16 DIAGNOSIS — Z98891 History of uterine scar from previous surgery: Secondary | ICD-10-CM

## 2023-05-16 DIAGNOSIS — Z3689 Encounter for other specified antenatal screening: Secondary | ICD-10-CM | POA: Insufficient documentation

## 2023-05-16 DIAGNOSIS — I10 Essential (primary) hypertension: Secondary | ICD-10-CM

## 2023-05-16 DIAGNOSIS — O09523 Supervision of elderly multigravida, third trimester: Secondary | ICD-10-CM

## 2023-05-16 DIAGNOSIS — E119 Type 2 diabetes mellitus without complications: Secondary | ICD-10-CM

## 2023-05-16 DIAGNOSIS — O365930 Maternal care for other known or suspected poor fetal growth, third trimester, not applicable or unspecified: Secondary | ICD-10-CM

## 2023-05-16 DIAGNOSIS — O10019 Pre-existing essential hypertension complicating pregnancy, unspecified trimester: Secondary | ICD-10-CM

## 2023-05-16 DIAGNOSIS — IMO0001 Reserved for inherently not codable concepts without codable children: Secondary | ICD-10-CM

## 2023-05-16 DIAGNOSIS — Z531 Procedure and treatment not carried out because of patient's decision for reasons of belief and group pressure: Secondary | ICD-10-CM

## 2023-05-16 DIAGNOSIS — O0990 Supervision of high risk pregnancy, unspecified, unspecified trimester: Secondary | ICD-10-CM

## 2023-05-16 DIAGNOSIS — R030 Elevated blood-pressure reading, without diagnosis of hypertension: Secondary | ICD-10-CM | POA: Diagnosis present

## 2023-05-16 LAB — COMPREHENSIVE METABOLIC PANEL
ALT: 28 U/L (ref 0–44)
AST: 18 U/L (ref 15–41)
Albumin: 2.6 g/dL — ABNORMAL LOW (ref 3.5–5.0)
Alkaline Phosphatase: 118 U/L (ref 38–126)
Anion gap: 9 (ref 5–15)
BUN: 8 mg/dL (ref 6–20)
CO2: 21 mmol/L — ABNORMAL LOW (ref 22–32)
Calcium: 9 mg/dL (ref 8.9–10.3)
Chloride: 106 mmol/L (ref 98–111)
Creatinine, Ser: 0.71 mg/dL (ref 0.44–1.00)
GFR, Estimated: >60 mL/min (ref >60)
Glucose, Bld: 94 mg/dL (ref 70–99)
Potassium: 3.8 mmol/L (ref 3.5–5.1)
Sodium: 136 mmol/L (ref 135–145)
Total Bilirubin: 0.3 mg/dL (ref 0.3–1.2)
Total Protein: 6.3 g/dL — ABNORMAL LOW (ref 6.5–8.1)

## 2023-05-16 LAB — CBC
HCT: 35.5 % — ABNORMAL LOW (ref 36.0–46.0)
Hemoglobin: 11.5 g/dL — ABNORMAL LOW (ref 12.0–15.0)
MCH: 26.6 pg (ref 26.0–34.0)
MCHC: 32.4 g/dL (ref 30.0–36.0)
MCV: 82 fL (ref 80.0–100.0)
Platelets: 429 K/uL — ABNORMAL HIGH (ref 150–400)
RBC: 4.33 MIL/uL (ref 3.87–5.11)
RDW: 13.4 % (ref 11.5–15.5)
WBC: 10.7 K/uL — ABNORMAL HIGH (ref 4.0–10.5)
nRBC: 0 % (ref 0.0–0.2)

## 2023-05-16 LAB — PROTEIN / CREATININE RATIO, URINE
Creatinine, Urine: 182 mg/dL
Protein Creatinine Ratio: 0.1 mg/mg{Cre} (ref 0.00–0.15)
Total Protein, Urine: 19 mg/dL

## 2023-05-16 NOTE — Progress Notes (Signed)
   PRENATAL VISIT NOTE  Subjective:  Jasminne Corthell is a 37 y.o. O9G2952 at [redacted]w[redacted]d being seen today for ongoing prenatal care.  She is currently monitored for the following issues for this high-risk pregnancy and has history of c section x 4; Refusal of blood transfusions as patient is Jehovah's Witness; Essential hypertension; New onset type 2 diabetes mellitus (HCC); Thrombocytosis; Supervision of high risk pregnancy, antepartum; IUGR (intrauterine growth restriction) affecting care of mother; and Advanced maternal age in multigravida, third trimester on their problem list.  Patient doing well with no acute concerns today. She reports headache.  Contractions: Irritability. Vag. Bleeding: None.  Movement: Present. Denies leaking of fluid.   The following portions of the patient's history were reviewed and updated as appropriate: allergies, current medications, past family history, past medical history, past social history, past surgical history and problem list. Problem list updated.  Objective:   Vitals:   05/16/23 1052 05/16/23 1055 05/16/23 1135  BP: (!) 132/94 (!) 140/102 129/86  Pulse: 90  88  Weight: 209 lb (94.8 kg)      Fetal Status: Fetal Heart Rate (bpm): 150 Fundal Height: 33 cm Movement: Present     General:  Alert, oriented and cooperative. Patient is in no acute distress.  Skin: Skin is warm and dry. No rash noted.   Cardiovascular: Normal heart rate noted  Respiratory: Normal respiratory effort, no problems with respiration noted  Abdomen: Soft, gravid, appropriate for gestational age.  Pain/Pressure: Present     Pelvic: Cervical exam deferred        Extremities: Normal range of motion.  Edema: None  Mental Status:  Normal mood and affect. Normal behavior. Normal judgment and thought content.   Assessment and Plan:  Pregnancy: W4X3244 at [redacted]w[redacted]d  1. Supervision of high risk pregnancy, antepartum Continue routine prenatal care, will try to schedule repeat c section at  36 weeks  2. [redacted] weeks gestation of pregnancy   3. Refusal of blood transfusions as patient is Jehovah's Witness   4. New onset type 2 diabetes mellitus (HCC) Pt did not bring blood sugars.  It does not appear she is still on any insulin.  Compliance may be an issue  FBS: 120 PPBS: 153  5. history of c section x 4 Will try to schedule repeat c section at 36 weeks with 2 MD.  Pt is ambivalent regarding BTL.  Pt advised future pregnancies and need for c section will have worsening risks of hemorrhage and placental issues (accreta spectrum)  6. Poor fetal growth affecting management of mother in third trimester, single or unspecified fetus EFW at 5%, elevated dopplers noted, continue twice weekly BPP with delivery at 36 weeks  7. Essential hypertension   8. Advanced maternal age in multigravida, third trimester   Preterm labor symptoms and general obstetric precautions including but not limited to vaginal bleeding, contractions, leaking of fluid and fetal movement were reviewed in detail with the patient.  Please refer to After Visit Summary for other counseling recommendations.   Return in about 1 week (around 05/23/2023) for [facility], in person, vag swabs at next visit.  Pt has 2/3 BP elevated on this visit.  Pt has had headache for 2 days unresponsive to tylenol.  Pt to MAU for preeclampsia workup.  Mariel Aloe, MD Faculty Attending Center for [facility]

## 2023-05-16 NOTE — MAU Note (Signed)
.  Amy Kim is a 37 y.o. at [redacted]w[redacted]d here in MAU reporting: pt sent from office. Has headache for 2 days. B/p elevated in office.  Denies any visual changes or abd pain. Good fetal movement reported.  Onset of complaint: 2 days Pain score: 5  Vitals:   05/16/23 1322 05/16/23 1330  BP: 130/84 121/79  Pulse: 82 82  Resp: 18   Temp: 98 F (36.7 C)   SpO2:  97%     FHT:140 Lab orders placed from triage:

## 2023-05-16 NOTE — MAU Provider Note (Signed)
History     CSN: [id number]  Arrival date and time: 05/16/23 1244   Event Date/Time   First Provider Initiated Contact with Patient 05/16/23 1417      Chief Complaint  Patient presents with   Headache   Hypertension    Amy Kim is a 37 y.o. M5H8469 at [redacted]w[redacted]d who receives care at [facility].  She presents today for HA and elevated bp in setting of CHTN.  She states she woke up 2 days ago with headache on left side, above her eye, that is throbbing.  She states the pain is constant and has been unrelieved with tylenol.  She notes that it has moments when it gets worse, but has not identified a trigger.  She rates the pain a 5-6/10. She endorses fetal movement. Patient denies visual disturbances or RUQ pain.   OB History     Gravida  6   Para  4   Term  4   Preterm  0   AB  1   Living  4      SAB  1   IAB  0   Ectopic  0   Multiple  0   Live Births  4           Past Medical History:  Diagnosis Date   Back pain    DDD   Cancer (HCC)    adenocarcinoma of the colon   Diabetes (HCC)    Gastric ulcer 2021   History of migraine    last one 2 days ago   IDA (iron deficiency anemia)    Pregnancy induced hypertension    Refusal of blood transfusions as patient is Jehovah's Witness 09/18/2018   Urinary frequency     Past Surgical History:  Procedure Laterality Date   CESAREAN SECTION  2011   x 4 total   CESAREAN SECTION N/A 08/11/2015   Procedure: REPEAT CESAREAN SECTION;  Surgeon: Levie Heritage, DO;  Location: [facility];  Service: Obstetrics;  Laterality: N/A;   CESAREAN SECTION N/A 09/19/2018   Procedure: CESAREAN SECTION;  Surgeon: Tereso Newcomer, MD;  Location: [facility];  Service: Obstetrics;  Laterality: N/A;   CESAREAN SECTION  2014   CHOLECYSTECTOMY N/A 03/21/2016   Procedure: LAPAROSCOPIC CHOLECYSTECTOMY;  Surgeon: Axel Filler, MD;  Location: [facility] OR;  Service: General;  Laterality: N/A;   COLONOSCOPY      adebocarcinoma,10/22    Family History  Problem Relation Age of Onset   Colon polyps Mother    Hypertension Mother    Hypertension Father    Heart disease Paternal Grandmother    Colon cancer Neg Hx    Esophageal cancer Neg Hx    Pancreatic cancer Neg Hx    Stomach cancer Neg Hx    Liver disease Neg Hx    Crohn's disease Neg Hx    Rectal cancer Neg Hx    Ulcerative colitis Neg Hx     Social History   Tobacco Use   Smoking status: Never    Passive exposure: Never   Smokeless tobacco: Never  Vaping Use   Vaping status: Never Used  Substance Use Topics   Alcohol use: Not Currently    Comment: occasionally   Drug use: No    Allergies:  Allergies  Allergen Reactions   Aspirin     Hx of bleeding ulcers   Ibuprofen     Hx of bleeding ulcers    Medications Prior to Admission  Medication Sig Dispense Refill  Last Dose   acetaminophen (TYLENOL) 325 MG tablet Take 2 tablets (650 mg total) by mouth every 6 (six) hours as needed. 36 tablet 0 05/15/2023   labetalol (NORMODYNE) 200 MG tablet Take 1 tablet (200 mg total) by mouth 2 (two) times daily. 60 tablet 6 05/16/2023   Prenatal MV & Min w/FA-DHA (PRENATAL GUMMIES) 0.18-25 MG CHEW Chew by mouth.   05/16/2023   insulin glargine (LANTUS) 100 UNIT/ML Solostar Pen Inject 6 Units into the skin 2 (two) times daily. Give first thing in the morning & right before you go to bed (Patient not taking: Reported on 05/03/2023) 15 mL 0    Insulin Pen Needle (PEN NEEDLES) 33G X 4 MM MISC Please use as directed (Patient not taking: Reported on 05/09/2023) 100 each 5     Review of Systems  Constitutional:  Negative for chills and fever.  Eyes:  Negative for visual disturbance.  Gastrointestinal:  Negative for abdominal pain, constipation, diarrhea, nausea and vomiting.  Genitourinary:  Negative for difficulty urinating, dysuria, vaginal bleeding and vaginal discharge.  Neurological:  Positive for headaches. Negative for dizziness and  light-headedness.   Physical Exam   Blood pressure 136/85, pulse 82, temperature 98 F (36.7 C), resp. rate 18, last menstrual period 09/22/2022, SpO2 100%. Vitals:   05/16/23 1322 05/16/23 1330 05/16/23 1345 05/16/23 1401  BP: 130/84 121/79 125/81 136/85   05/16/23 1416  BP: (!) 134/90    Physical Exam Vitals reviewed.  Constitutional:      Appearance: Normal appearance. She is well-developed.  HENT:     Head: Normocephalic and atraumatic.  Eyes:     Conjunctiva/sclera: Conjunctivae normal.  Cardiovascular:     Rate and Rhythm: Normal rate.  Pulmonary:     Effort: Pulmonary effort is normal. No respiratory distress.  Abdominal:     Comments: Appears Gravid  Musculoskeletal:        General: Normal range of motion.     Cervical back: Normal range of motion.  Skin:    General: Skin is warm and dry.  Neurological:     Mental Status: She is alert and oriented to person, place, and time.  Psychiatric:        Mood and Affect: Mood normal.        Behavior: Behavior normal.     Fetal Assessment 135 bpm, Mod Var, -Decels, +15x15 Accels Toco: No ctx graphed  MAU Course   Results for orders placed or performed during the hospital encounter of 05/16/23 (from the past 24 hour(s))  CBC     Status: Abnormal   Collection Time: 05/16/23  2:42 PM  Result Value Ref Range   WBC 10.7 (H) 4.0 - 10.5 K/uL   RBC 4.33 3.87 - 5.11 MIL/uL   Hemoglobin 11.5 (L) 12.0 - 15.0 g/dL   HCT 60.4 (L) 54.0 - 98.1 %   MCV 82.0 80.0 - 100.0 fL   MCH 26.6 26.0 - 34.0 pg   MCHC 32.4 30.0 - 36.0 g/dL   RDW 19.1 47.8 - 29.5 %   Platelets 429 (H) 150 - 400 K/uL   nRBC 0.0 0.0 - 0.2 %  Comprehensive metabolic panel     Status: Abnormal   Collection Time: 05/16/23  2:42 PM  Result Value Ref Range   Sodium 136 135 - 145 mmol/L   Potassium 3.8 3.5 - 5.1 mmol/L   Chloride 106 98 - 111 mmol/L   CO2 21 (L) 22 - 32 mmol/L   Glucose, Bld 94 70 -  99 mg/dL   BUN 8 6 - 20 mg/dL   Creatinine, Ser 6.57  0.44 - 1.00 mg/dL   Calcium 9.0 8.9 - 84.6 mg/dL   Total Protein 6.3 (L) 6.5 - 8.1 g/dL   Albumin 2.6 (L) 3.5 - 5.0 g/dL   AST 18 15 - 41 U/L   ALT 28 0 - 44 U/L   Alkaline Phosphatase 118 38 - 126 U/L   Total Bilirubin 0.3 0.3 - 1.2 mg/dL   GFR, Estimated >96 >29 mL/min   Anion gap 9 5 - 15  Protein / creatinine ratio, urine     Status: None   Collection Time: 05/16/23  2:42 PM  Result Value Ref Range   Creatinine, Urine 182 mg/dL   Total Protein, Urine 19 mg/dL   Protein Creatinine Ratio 0.10 0.00 - 0.15 mg/mg[Cre]   No results found.  MDM Physical Exam Labs: CBC, CMP, PC Ratio Measure BPQ15 min EFM Assessment and Plan  37 year old [id number]  SIUP at 33.5 weeks Cat I FT Headache CHTN  -POC Reviewed. -Exam performed. -Patient declines pain medication for HA. -Informed that primary concern is headache as elevated blood pressure is known history. -Patient reports she took tylenol and would like to avoid additional medications.  -Discussed caffeine intake, via soda, and patient agreeable. -Encouraged to inform provider if desire for oral medication changes. -NST reactive. -Labs ordered.   Cherre Robins MSN, CNM 05/16/2023, 2:17 PM   Reassessment (3:49 PM) -Patient continues to decline pain medication, but reports HA despite caffeine intake. -Patient states the pain is tolerable. -Informed of labs. -Instructed to continue labetalol as scheduled. -Encouraged to call primary office or return to MAU if symptoms worsen or with the onset of new symptoms. -Discharged to home in stable condition.  Cherre Robins MSN, CNM Advanced Practice Provider, Center for [employer]

## 2023-05-17 ENCOUNTER — Ambulatory Visit: Payer: Commercial Managed Care - HMO

## 2023-05-17 ENCOUNTER — Ambulatory Visit: Payer: Commercial Managed Care - HMO | Attending: Obstetrics and Gynecology

## 2023-05-17 ENCOUNTER — Ambulatory Visit: Payer: Commercial Managed Care - HMO | Admitting: *Deleted

## 2023-05-17 VITALS — BP 123/72 | HR 86

## 2023-05-17 DIAGNOSIS — O34219 Maternal care for unspecified type scar from previous cesarean delivery: Secondary | ICD-10-CM

## 2023-05-17 DIAGNOSIS — O24113 Pre-existing diabetes mellitus, type 2, in pregnancy, third trimester: Secondary | ICD-10-CM | POA: Diagnosis not present

## 2023-05-17 DIAGNOSIS — Z3A33 33 weeks gestation of pregnancy: Secondary | ICD-10-CM

## 2023-05-17 DIAGNOSIS — E1169 Type 2 diabetes mellitus with other specified complication: Secondary | ICD-10-CM

## 2023-05-17 DIAGNOSIS — O365930 Maternal care for other known or suspected poor fetal growth, third trimester, not applicable or unspecified: Secondary | ICD-10-CM | POA: Insufficient documentation

## 2023-05-17 DIAGNOSIS — Z7984 Long term (current) use of oral hypoglycemic drugs: Secondary | ICD-10-CM

## 2023-05-17 DIAGNOSIS — O0990 Supervision of high risk pregnancy, unspecified, unspecified trimester: Secondary | ICD-10-CM | POA: Insufficient documentation

## 2023-05-17 DIAGNOSIS — E669 Obesity, unspecified: Secondary | ICD-10-CM

## 2023-05-17 DIAGNOSIS — O10013 Pre-existing essential hypertension complicating pregnancy, third trimester: Secondary | ICD-10-CM

## 2023-05-17 DIAGNOSIS — O99213 Obesity complicating pregnancy, third trimester: Secondary | ICD-10-CM

## 2023-05-17 DIAGNOSIS — O09523 Supervision of elderly multigravida, third trimester: Secondary | ICD-10-CM

## 2023-05-17 NOTE — Procedures (Signed)
Amy Kim 04-09-1986 [redacted]w[redacted]d  Fetus A Non-Stress Test Interpretation for 05/17/23  Indication: IUGR, Chronic Hypertenstion, Diabetes, and AMA  Fetal Heart Rate A Mode: External Baseline Rate (A): 130 bpm Variability: Moderate Accelerations: 15 x 15 Decelerations: None Multiple birth?: No  Uterine Activity Mode: Toco Contraction Frequency (min): none Resting Tone Palpated: Relaxed  Interpretation (Fetal Testing) Nonstress Test Interpretation: Reactive Overall Impression: Reassuring for gestational age Comments: Tracing reviewed by Dr. Parke Poisson

## 2023-05-20 ENCOUNTER — Encounter (HOSPITAL_COMMUNITY): Payer: Self-pay

## 2023-05-20 NOTE — Patient Instructions (Signed)
06/03/2023 Mayda Mathews  05/20/2023   Your procedure is scheduled on:  06/03/2023  Arrive at 1015 at Entrance C on [employer] at [facility]  and [employer]. You are invited to use the FREE valet parking or use the Visitor's parking deck.  Pick up the phone at the desk and dial [phone].  Call this number if you have problems the morning of surgery: [phone]  Remember:   Do not eat food:(After Midnight) Desps de medianoche.  Do not drink clear liquids: (After Midnight) Desps de medianoche.  Take these medicines the morning of surgery with A SIP OF WATER:  Labetalol and take half of the prescribed dose of insulin the night before surgery and no insulin on day of surgery   Do not wear jewelry, make-up or nail polish.  Do not wear lotions, powders, or perfumes. Do not wear deodorant.  Do not shave 48 hours prior to surgery.  Do not bring valuables to the hospital.  [facility] is not   responsible for any belongings or valuables brought to the hospital.  Contacts, dentures or bridgework may not be worn into surgery.  Leave suitcase in the car. After surgery it may be brought to your room.  For patients admitted to the hospital, checkout time is 11:00 AM the day of              discharge.      Please read over the following fact sheets that you were given:     Preparing for Surgery

## 2023-05-23 ENCOUNTER — Ambulatory Visit (INDEPENDENT_AMBULATORY_CARE_PROVIDER_SITE_OTHER): Payer: Commercial Managed Care - HMO | Admitting: Obstetrics and Gynecology

## 2023-05-23 ENCOUNTER — Encounter: Payer: Self-pay | Admitting: Obstetrics and Gynecology

## 2023-05-23 VITALS — BP 110/73 | HR 98 | Wt 206.0 lb

## 2023-05-23 DIAGNOSIS — O09523 Supervision of elderly multigravida, third trimester: Secondary | ICD-10-CM

## 2023-05-23 DIAGNOSIS — O0990 Supervision of high risk pregnancy, unspecified, unspecified trimester: Secondary | ICD-10-CM

## 2023-05-23 DIAGNOSIS — I10 Essential (primary) hypertension: Secondary | ICD-10-CM

## 2023-05-23 DIAGNOSIS — E119 Type 2 diabetes mellitus without complications: Secondary | ICD-10-CM

## 2023-05-23 DIAGNOSIS — Z3A34 34 weeks gestation of pregnancy: Secondary | ICD-10-CM

## 2023-05-23 DIAGNOSIS — O0993 Supervision of high risk pregnancy, unspecified, third trimester: Secondary | ICD-10-CM

## 2023-05-23 DIAGNOSIS — Z98891 History of uterine scar from previous surgery: Secondary | ICD-10-CM

## 2023-05-23 DIAGNOSIS — O365930 Maternal care for other known or suspected poor fetal growth, third trimester, not applicable or unspecified: Secondary | ICD-10-CM

## 2023-05-23 MED ORDER — METFORMIN HCL 1000 MG PO TABS: 1000.0000 mg | ORAL_TABLET | Freq: Two times a day (BID) | ORAL | 0 refills | Status: DC

## 2023-05-23 NOTE — Progress Notes (Signed)
   PRENATAL VISIT NOTE  Subjective:  Amy Kim is a 37 y.o. [id number] at [redacted]w[redacted]d being seen today for ongoing prenatal care.  She is currently monitored for the following issues for this high-risk pregnancy and has history of c section x 4; Refusal of blood transfusions as patient is Jehovah's Witness; Essential hypertension; New onset type 2 diabetes mellitus (HCC); Thrombocytosis; Supervision of high risk pregnancy, antepartum; IUGR (intrauterine growth restriction) affecting care of mother; and Advanced maternal age in multigravida, third trimester on their problem list.  Patient reports no complaints.  Contractions: Irritability. Vag. Bleeding: None.  Movement: Present. Denies leaking of fluid.   The following portions of the patient's history were reviewed and updated as appropriate: allergies, current medications, past family history, past medical history, past social history, past surgical history and problem list.   Objective:   Vitals:   05/23/23 1014  BP: 110/73  Pulse: 98  Weight: 206 lb (93.4 kg)    Fetal Status: Fetal Heart Rate (bpm): 146 Fundal Height: 35 cm Movement: Present     General:  Alert, oriented and cooperative. Patient is in no acute distress.  Skin: Skin is warm and dry. No rash noted.   Cardiovascular: Normal heart rate noted  Respiratory: Normal respiratory effort, no problems with respiration noted  Abdomen: Soft, gravid, appropriate for gestational age.  Pain/Pressure: Present     Pelvic: Cervical exam deferred        Extremities: Normal range of motion.  Edema: None  Mental Status: Normal mood and affect. Normal behavior. Normal judgment and thought content.   Assessment and Plan:  Pregnancy: [id number] at [redacted]w[redacted]d 1. Supervision of high risk pregnancy, antepartum Patient is doing well without complaints Cultures next visit  2. history of c section x 4 Scheduled for repeat c-section on 9/9 Patient plans Nexplanon for contraception  3. New onset  type 2 diabetes mellitus (HCC) Patient reports elevated fasting 130's  and pp values 130-150's Patient has not started insulin due to insurance issues Rx metformin provided 1000 mg BID in the meantime but patient is hesitant Discussed neonatal consequences of poorly controlled diabetes  4. Essential hypertension Stable Continue labetalol and ASA  5. Poor fetal growth affecting management of mother in third trimester, single or unspecified fetus Normal BPP Continue antenatal testing with dopplers  6. Advanced maternal age in multigravida, third trimester   Preterm labor symptoms and general obstetric precautions including but not limited to vaginal bleeding, contractions, leaking of fluid and fetal movement were reviewed in detail with the patient. Please refer to After Visit Summary for other counseling recommendations.   Return in about 1 week (around 05/30/2023) for in person, ROB, High risk.  Future Appointments  Date Time Provider Department Center  05/24/2023  8:30 AM [facility] NURSE [facility]  05/24/2023  8:45 AM [facility] NST [facility]  05/24/2023  9:45 AM [facility] US6 [facility]  05/31/2023  7:30 AM [facility] NURSE [facility]  05/31/2023  7:45 AM [facility] US4 [facility]  05/31/2023  8:45 AM [facility] NST [facility]  05/31/2023 10:00 AM [facility] PAT 1 [facility] None  06/06/2023  9:35 AM Warden Fillers, MD [facility] None  06/13/2023  8:55 AM Hermina Staggers, MD [facility] None  06/20/2023  9:35 AM Adam Phenix, MD [facility] None    Catalina Antigua, MD

## 2023-05-24 ENCOUNTER — Ambulatory Visit (HOSPITAL_BASED_OUTPATIENT_CLINIC_OR_DEPARTMENT_OTHER): Payer: Commercial Managed Care - HMO

## 2023-05-24 ENCOUNTER — Ambulatory Visit: Payer: Commercial Managed Care - HMO | Admitting: *Deleted

## 2023-05-24 ENCOUNTER — Ambulatory Visit: Payer: Commercial Managed Care - HMO

## 2023-05-24 ENCOUNTER — Ambulatory Visit: Payer: Commercial Managed Care - HMO | Attending: Obstetrics and Gynecology | Admitting: *Deleted

## 2023-05-24 VITALS — BP 111/73 | HR 91

## 2023-05-24 DIAGNOSIS — O24113 Pre-existing diabetes mellitus, type 2, in pregnancy, third trimester: Secondary | ICD-10-CM | POA: Diagnosis not present

## 2023-05-24 DIAGNOSIS — E669 Obesity, unspecified: Secondary | ICD-10-CM

## 2023-05-24 DIAGNOSIS — Z3A34 34 weeks gestation of pregnancy: Secondary | ICD-10-CM

## 2023-05-24 DIAGNOSIS — O365930 Maternal care for other known or suspected poor fetal growth, third trimester, not applicable or unspecified: Secondary | ICD-10-CM

## 2023-05-24 DIAGNOSIS — O10013 Pre-existing essential hypertension complicating pregnancy, third trimester: Secondary | ICD-10-CM

## 2023-05-24 DIAGNOSIS — O99213 Obesity complicating pregnancy, third trimester: Secondary | ICD-10-CM | POA: Diagnosis not present

## 2023-05-24 DIAGNOSIS — O09523 Supervision of elderly multigravida, third trimester: Secondary | ICD-10-CM | POA: Insufficient documentation

## 2023-05-24 DIAGNOSIS — O34219 Maternal care for unspecified type scar from previous cesarean delivery: Secondary | ICD-10-CM

## 2023-05-24 DIAGNOSIS — O10913 Unspecified pre-existing hypertension complicating pregnancy, third trimester: Secondary | ICD-10-CM | POA: Diagnosis not present

## 2023-05-24 DIAGNOSIS — O0990 Supervision of high risk pregnancy, unspecified, unspecified trimester: Secondary | ICD-10-CM

## 2023-05-24 NOTE — Procedures (Signed)
Tajanae Sare 01-May-1986 [redacted]w[redacted]d  Fetus A Non-Stress Test Interpretation for 05/24/23-NST with BPP  Indication: Chronic Hypertenstion, Diabetes, Advanced Maternal Age >40 years, and obese  Fetal Heart Rate A Mode: External Baseline Rate (A): 130 bpm Variability: Moderate Accelerations: 15 x 15 Decelerations: None Multiple birth?: No  Uterine Activity Mode: Toco Contraction Frequency (min): none Resting Tone Palpated: Relaxed  Interpretation (Fetal Testing) Nonstress Test Interpretation: Reactive Comments: Tracing reviewed byDR. Parke Poisson

## 2023-05-30 NOTE — Telephone Encounter (Signed)
See routing comment(s) for message information.

## 2023-05-31 ENCOUNTER — Ambulatory Visit: Payer: Commercial Managed Care - HMO | Admitting: *Deleted

## 2023-05-31 ENCOUNTER — Encounter (HOSPITAL_COMMUNITY)
Admission: RE | Admit: 2023-05-31 | Discharge: 2023-05-31 | Disposition: A | Discharge status: Home / Self Care | Payer: Commercial Managed Care - HMO | Source: Ambulatory Visit | Attending: Family Medicine | Admitting: Family Medicine

## 2023-05-31 ENCOUNTER — Encounter (HOSPITAL_COMMUNITY): Payer: Self-pay | Admitting: Family Medicine

## 2023-05-31 ENCOUNTER — Ambulatory Visit (HOSPITAL_BASED_OUTPATIENT_CLINIC_OR_DEPARTMENT_OTHER): Payer: Commercial Managed Care - HMO

## 2023-05-31 VITALS — BP 124/68 | HR 84

## 2023-05-31 DIAGNOSIS — O09523 Supervision of elderly multigravida, third trimester: Secondary | ICD-10-CM

## 2023-05-31 DIAGNOSIS — O99213 Obesity complicating pregnancy, third trimester: Secondary | ICD-10-CM

## 2023-05-31 DIAGNOSIS — Z98891 History of uterine scar from previous surgery: Secondary | ICD-10-CM | POA: Insufficient documentation

## 2023-05-31 DIAGNOSIS — O10913 Unspecified pre-existing hypertension complicating pregnancy, third trimester: Secondary | ICD-10-CM | POA: Insufficient documentation

## 2023-05-31 DIAGNOSIS — O10013 Pre-existing essential hypertension complicating pregnancy, third trimester: Secondary | ICD-10-CM

## 2023-05-31 DIAGNOSIS — O0990 Supervision of high risk pregnancy, unspecified, unspecified trimester: Secondary | ICD-10-CM | POA: Insufficient documentation

## 2023-05-31 DIAGNOSIS — E669 Obesity, unspecified: Secondary | ICD-10-CM

## 2023-05-31 DIAGNOSIS — O365930 Maternal care for other known or suspected poor fetal growth, third trimester, not applicable or unspecified: Secondary | ICD-10-CM | POA: Insufficient documentation

## 2023-05-31 DIAGNOSIS — Z3A35 35 weeks gestation of pregnancy: Secondary | ICD-10-CM

## 2023-05-31 DIAGNOSIS — Z01812 Encounter for preprocedural laboratory examination: Secondary | ICD-10-CM | POA: Insufficient documentation

## 2023-05-31 DIAGNOSIS — O24113 Pre-existing diabetes mellitus, type 2, in pregnancy, third trimester: Secondary | ICD-10-CM | POA: Insufficient documentation

## 2023-05-31 DIAGNOSIS — O34219 Maternal care for unspecified type scar from previous cesarean delivery: Secondary | ICD-10-CM

## 2023-05-31 LAB — COMPREHENSIVE METABOLIC PANEL
ALT: 21 U/L (ref 0–44)
AST: 15 U/L (ref 15–41)
Albumin: 2.8 g/dL — ABNORMAL LOW (ref 3.5–5.0)
Alkaline Phosphatase: 128 U/L — ABNORMAL HIGH (ref 38–126)
Anion gap: 9 (ref 5–15)
BUN: 9 mg/dL (ref 6–20)
CO2: 23 mmol/L (ref 22–32)
Calcium: 8.6 mg/dL — ABNORMAL LOW (ref 8.9–10.3)
Chloride: 100 mmol/L (ref 98–111)
Creatinine, Ser: 0.61 mg/dL (ref 0.44–1.00)
GFR, Estimated: >60 mL/min (ref >60)
Glucose, Bld: 222 mg/dL — ABNORMAL HIGH (ref 70–99)
Potassium: 3.3 mmol/L — ABNORMAL LOW (ref 3.5–5.1)
Sodium: 132 mmol/L — ABNORMAL LOW (ref 135–145)
Total Bilirubin: 0.4 mg/dL (ref 0.3–1.2)
Total Protein: 6.8 g/dL (ref 6.5–8.1)

## 2023-05-31 LAB — CBC
HCT: 37.5 % (ref 36.0–46.0)
Hemoglobin: 12 g/dL (ref 12.0–15.0)
MCH: 27 pg (ref 26.0–34.0)
MCHC: 32 g/dL (ref 30.0–36.0)
MCV: 84.3 fL (ref 80.0–100.0)
Platelets: 457 10*3/uL — ABNORMAL HIGH (ref 150–400)
RBC: 4.45 MIL/uL (ref 3.87–5.11)
RDW: 13.3 % (ref 11.5–15.5)
WBC: 10 10*3/uL (ref 4.0–10.5)
nRBC: 0 % (ref 0.0–0.2)

## 2023-05-31 LAB — ANTIBODY SCREEN: Antibody Screen: NEGATIVE

## 2023-05-31 LAB — ABO/RH: ABO/RH(D): B POS

## 2023-05-31 NOTE — Procedures (Signed)
Amy Kim [birth year] [redacted]w[redacted]d  Fetus A Non-Stress Test Interpretation for 05/31/23--BPP NST  Indication: IUGR, Chronic Hypertenstion, and Diabetes  Fetal Heart Rate A Mode: External Baseline Rate (A): 130 bpm Variability: Moderate Accelerations: 15 x 15 Decelerations: None Multiple birth?: No  Uterine Activity Mode: Toco Contraction Frequency (min): none Resting Tone Palpated: Relaxed  Interpretation (Fetal Testing) Comments: Tracing reviewed by Dr. Parke Poisson

## 2023-05-31 NOTE — Anesthesia Preprocedure Evaluation (Addendum)
Anesthesia Evaluation  Patient identified by MRN, date of birth, ID band Patient awake    Reviewed: Allergy & Precautions, NPO status , Patient's Chart, lab work & pertinent test results, reviewed documented beta blocker date and time   Airway Mallampati: II       Dental no notable dental hx. (+) Teeth Intact, Dental Advisory Given   Pulmonary neg pulmonary ROS   Pulmonary exam normal breath sounds clear to auscultation       Cardiovascular hypertension, Pt. on medications and Pt. on home beta blockers Normal cardiovascular exam Rhythm:Regular Rate:Normal     Neuro/Psych negative neurological ROS  negative psych ROS   GI/Hepatic Neg liver ROS, PUD,GERD  Medicated,,Hx/o adenoCa of colon   Endo/Other  diabetes, Well Controlled, Gestational, Oral Hypoglycemic Agents  Morbid obesity  Renal/GU negative Renal ROS  negative genitourinary   Musculoskeletal negative musculoskeletal ROS (+)    Abdominal  (+) + obese  Peds  Hematology  (+) Blood dyscrasia, anemia , REFUSES BLOOD PRODUCTS, JEHOVAH'S WITNESS  Anesthesia Other Findings   Reproductive/Obstetrics (+) Pregnancy Previous C/Section x 4 Undesired fertility                              Anesthesia Physical Anesthesia Plan  ASA: 3  Anesthesia Plan: Combined Spinal and Epidural and Spinal   Post-op Pain Management: Minimal or no pain anticipated   Induction: Intravenous  PONV Risk Score and Plan: 3 and Treatment may vary due to age or medical condition  Airway Management Planned: Natural Airway and Simple Face Mask  Additional Equipment: None  Intra-op Plan:   Post-operative Plan:   Informed Consent: I have reviewed the patients History and Physical, chart, labs and discussed the procedure including the risks, benefits and alternatives for the proposed anesthesia with the patient or authorized representative who has indicated  his/her understanding and acceptance.     Dental advisory given  Plan Discussed with: Anesthesiologist and CRNA  Anesthesia Plan Comments:         Anesthesia Quick Evaluation

## 2023-06-01 LAB — RPR: RPR Ser Ql: NONREACTIVE

## 2023-06-03 ENCOUNTER — Inpatient Hospital Stay (HOSPITAL_COMMUNITY): Payer: Commercial Managed Care - HMO | Admitting: Anesthesiology

## 2023-06-03 ENCOUNTER — Encounter (HOSPITAL_COMMUNITY)
Admission: RE | Disposition: A | Discharge status: Home / Self Care | Payer: Self-pay | Source: Home / Self Care | Attending: Family Medicine

## 2023-06-03 ENCOUNTER — Other Ambulatory Visit: Payer: Self-pay

## 2023-06-03 ENCOUNTER — Inpatient Hospital Stay (HOSPITAL_COMMUNITY)
Admission: RE | Admit: 2023-06-03 | Discharge: 2023-06-06 | DRG: 786 | Disposition: A | Discharge status: Home / Self Care | Payer: Commercial Managed Care - HMO | Attending: Family Medicine | Admitting: Family Medicine

## 2023-06-03 ENCOUNTER — Encounter (HOSPITAL_COMMUNITY): Payer: Self-pay | Admitting: Family Medicine

## 2023-06-03 DIAGNOSIS — E119 Type 2 diabetes mellitus without complications: Secondary | ICD-10-CM | POA: Diagnosis present

## 2023-06-03 DIAGNOSIS — Z7984 Long term (current) use of oral hypoglycemic drugs: Secondary | ICD-10-CM

## 2023-06-03 DIAGNOSIS — Z8711 Personal history of peptic ulcer disease: Secondary | ICD-10-CM

## 2023-06-03 DIAGNOSIS — O09523 Supervision of elderly multigravida, third trimester: Secondary | ICD-10-CM | POA: Diagnosis not present

## 2023-06-03 DIAGNOSIS — O365930 Maternal care for other known or suspected poor fetal growth, third trimester, not applicable or unspecified: Secondary | ICD-10-CM | POA: Diagnosis present

## 2023-06-03 DIAGNOSIS — O2412 Pre-existing diabetes mellitus, type 2, in childbirth: Secondary | ICD-10-CM | POA: Diagnosis present

## 2023-06-03 DIAGNOSIS — O34211 Maternal care for low transverse scar from previous cesarean delivery: Principal | ICD-10-CM | POA: Diagnosis present

## 2023-06-03 DIAGNOSIS — O34219 Maternal care for unspecified type scar from previous cesarean delivery: Secondary | ICD-10-CM | POA: Diagnosis not present

## 2023-06-03 DIAGNOSIS — Z3A36 36 weeks gestation of pregnancy: Secondary | ICD-10-CM

## 2023-06-03 DIAGNOSIS — O1002 Pre-existing essential hypertension complicating childbirth: Secondary | ICD-10-CM | POA: Diagnosis present

## 2023-06-03 DIAGNOSIS — Z8249 Family history of ischemic heart disease and other diseases of the circulatory system: Secondary | ICD-10-CM

## 2023-06-03 DIAGNOSIS — O365990 Maternal care for other known or suspected poor fetal growth, unspecified trimester, not applicable or unspecified: Secondary | ICD-10-CM | POA: Diagnosis present

## 2023-06-03 DIAGNOSIS — Z98891 History of uterine scar from previous surgery: Principal | ICD-10-CM

## 2023-06-03 DIAGNOSIS — O9912 Other diseases of the blood and blood-forming organs and certain disorders involving the immune mechanism complicating childbirth: Secondary | ICD-10-CM | POA: Diagnosis not present

## 2023-06-03 DIAGNOSIS — Z01812 Encounter for preprocedural laboratory examination: Secondary | ICD-10-CM

## 2023-06-03 DIAGNOSIS — Z794 Long term (current) use of insulin: Secondary | ICD-10-CM | POA: Diagnosis not present

## 2023-06-03 DIAGNOSIS — O24424 Gestational diabetes mellitus in childbirth, insulin controlled: Secondary | ICD-10-CM | POA: Diagnosis not present

## 2023-06-03 DIAGNOSIS — I10 Essential (primary) hypertension: Secondary | ICD-10-CM | POA: Diagnosis present

## 2023-06-03 DIAGNOSIS — O900 Disruption of cesarean delivery wound: Secondary | ICD-10-CM | POA: Insufficient documentation

## 2023-06-03 DIAGNOSIS — O99214 Obesity complicating childbirth: Secondary | ICD-10-CM | POA: Diagnosis present

## 2023-06-03 DIAGNOSIS — O9902 Anemia complicating childbirth: Secondary | ICD-10-CM | POA: Diagnosis present

## 2023-06-03 DIAGNOSIS — Z531 Procedure and treatment not carried out because of patient's decision for reasons of belief and group pressure: Secondary | ICD-10-CM

## 2023-06-03 DIAGNOSIS — O0990 Supervision of high risk pregnancy, unspecified, unspecified trimester: Secondary | ICD-10-CM

## 2023-06-03 LAB — GLUCOSE, CAPILLARY
Glucose-Capillary: 139 mg/dL — ABNORMAL HIGH (ref 70–99)
Glucose-Capillary: 118 mg/dL — ABNORMAL HIGH (ref 70–99)
Glucose-Capillary: 121 mg/dL — ABNORMAL HIGH (ref 70–99)
Glucose-Capillary: 170 mg/dL — ABNORMAL HIGH (ref 70–99)

## 2023-06-03 SURGERY — APPLICATION OF CELL SAVER
Anesthesia: Spinal

## 2023-06-03 MED ORDER — OXYTOCIN-SODIUM CHLORIDE 30-0.9 UT/500ML-% IV SOLN
INTRAVENOUS | Status: DC | PRN
  Administered 2023-06-03: 300 mL via INTRAVENOUS

## 2023-06-03 MED ORDER — LACTATED RINGERS IV SOLN: INTRAVENOUS | Status: DC

## 2023-06-03 MED ORDER — FENTANYL CITRATE (PF) 100 MCG/2ML IJ SOLN
INTRAMUSCULAR | Status: AC
  Filled 2023-06-03: qty 2

## 2023-06-03 MED ORDER — SOD CITRATE-CITRIC ACID 500-334 MG/5ML PO SOLN
ORAL | Status: AC
  Filled 2023-06-03: qty 30

## 2023-06-03 MED ORDER — NALOXONE HCL 0.4 MG/ML IJ SOLN: 0.4000 mg | INTRAMUSCULAR | Status: DC | PRN

## 2023-06-03 MED ORDER — CEFAZOLIN SODIUM-DEXTROSE 2-4 GM/100ML-% IV SOLN
2.0000 g | INTRAVENOUS | Status: AC
  Administered 2023-06-03: 2 g via INTRAVENOUS

## 2023-06-03 MED ORDER — CEFAZOLIN SODIUM-DEXTROSE 2-4 GM/100ML-% IV SOLN
INTRAVENOUS | Status: AC
  Filled 2023-06-03: qty 100

## 2023-06-03 MED ORDER — OXYTOCIN-SODIUM CHLORIDE 30-0.9 UT/500ML-% IV SOLN
INTRAVENOUS | Status: AC
  Filled 2023-06-03: qty 500

## 2023-06-03 MED ORDER — SCOPOLAMINE 1 MG/3DAYS TD PT72
MEDICATED_PATCH | TRANSDERMAL | Status: AC
  Filled 2023-06-03: qty 1

## 2023-06-03 MED ORDER — POVIDONE-IODINE 10 % EX SWAB
2.0000 | Freq: Once | CUTANEOUS | Status: AC
  Administered 2023-06-03: 2 via TOPICAL

## 2023-06-03 MED ORDER — DIPHENHYDRAMINE HCL 25 MG PO CAPS: 25.0000 mg | ORAL_CAPSULE | ORAL | Status: DC | PRN

## 2023-06-03 MED ORDER — ONDANSETRON HCL 4 MG/2ML IJ SOLN: 4.0000 mg | Freq: Three times a day (TID) | INTRAMUSCULAR | Status: DC | PRN

## 2023-06-03 MED ORDER — OXYTOCIN-SODIUM CHLORIDE 30-0.9 UT/500ML-% IV SOLN
2.5000 [IU]/h | INTRAVENOUS | Status: AC
  Administered 2023-06-03: 2.5 [IU]/h via INTRAVENOUS
  Filled 2023-06-03: qty 500

## 2023-06-03 MED ORDER — MORPHINE SULFATE (PF) 0.5 MG/ML IJ SOLN
INTRAMUSCULAR | Status: AC
  Filled 2023-06-03: qty 10

## 2023-06-03 MED ORDER — ONDANSETRON HCL 4 MG/2ML IJ SOLN
INTRAMUSCULAR | Status: AC
  Filled 2023-06-03: qty 4

## 2023-06-03 MED ORDER — SODIUM CHLORIDE 0.9 % IR SOLN
Status: DC | PRN
  Administered 2023-06-03: 1

## 2023-06-03 MED ORDER — TRANEXAMIC ACID-NACL 1000-0.7 MG/100ML-% IV SOLN
1000.0000 mg | Freq: Once | INTRAVENOUS | Status: AC
  Administered 2023-06-03: 1000 mg via INTRAVENOUS

## 2023-06-03 MED ORDER — ENOXAPARIN SODIUM 40 MG/0.4ML IJ SOSY
40.0000 mg | PREFILLED_SYRINGE | INTRAMUSCULAR | Status: DC
  Administered 2023-06-04 – 2023-06-06 (×3): 40 mg via SUBCUTANEOUS
  Filled 2023-06-03 (×3): qty 0.4

## 2023-06-03 MED ORDER — PRENATAL MULTIVITAMIN CH
1.0000 | ORAL_TABLET | Freq: Every day | ORAL | Status: DC
  Administered 2023-06-04 – 2023-06-05 (×2): 1 via ORAL
  Filled 2023-06-03 (×3): qty 1

## 2023-06-03 MED ORDER — ACETAMINOPHEN 500 MG PO TABS
1000.0000 mg | ORAL_TABLET | Freq: Four times a day (QID) | ORAL | Status: DC
  Administered 2023-06-03 – 2023-06-06 (×10): 1000 mg via ORAL
  Filled 2023-06-03 (×10): qty 2

## 2023-06-03 MED ORDER — HYDROCODONE-ACETAMINOPHEN 5-325 MG PO TABS
1.0000 | ORAL_TABLET | ORAL | Status: DC | PRN
  Administered 2023-06-04 (×2): 1 via ORAL
  Administered 2023-06-05 (×2): 2 via ORAL
  Filled 2023-06-03: qty 1
  Filled 2023-06-03: qty 2
  Filled 2023-06-03 (×2): qty 1
  Filled 2023-06-03: qty 2

## 2023-06-03 MED ORDER — FENTANYL CITRATE (PF) 100 MCG/2ML IJ SOLN
INTRAMUSCULAR | Status: DC | PRN
  Administered 2023-06-03: 15 ug via INTRATHECAL

## 2023-06-03 MED ORDER — COCONUT OIL OIL: 1.0000 | TOPICAL_OIL | Status: DC | PRN

## 2023-06-03 MED ORDER — LABETALOL HCL 200 MG PO TABS
200.0000 mg | ORAL_TABLET | Freq: Two times a day (BID) | ORAL | Status: DC
  Administered 2023-06-03 (×2): 200 mg via ORAL
  Filled 2023-06-03 (×2): qty 1

## 2023-06-03 MED ORDER — ACETAMINOPHEN 10 MG/ML IV SOLN
INTRAVENOUS | Status: DC | PRN
Start: 2023-06-03 — End: 2023-06-03
  Administered 2023-06-03: 1000 mg via INTRAVENOUS

## 2023-06-03 MED ORDER — STERILE WATER FOR IRRIGATION IR SOLN
Status: DC | PRN
  Administered 2023-06-03: 1000 mL

## 2023-06-03 MED ORDER — NALOXONE HCL 4 MG/10ML IJ SOLN: 1.0000 ug/kg/h | INTRAVENOUS | Status: DC | PRN

## 2023-06-03 MED ORDER — BUPIVACAINE IN DEXTROSE 0.75-8.25 % IT SOLN
INTRATHECAL | Status: DC | PRN
  Administered 2023-06-03: 1.6 mL via INTRATHECAL

## 2023-06-03 MED ORDER — SCOPOLAMINE 1 MG/3DAYS TD PT72
1.0000 | MEDICATED_PATCH | TRANSDERMAL | Status: DC
  Administered 2023-06-03: 1.5 mg via TRANSDERMAL

## 2023-06-03 MED ORDER — SIMETHICONE 80 MG PO CHEW
80.0000 mg | CHEWABLE_TABLET | Freq: Three times a day (TID) | ORAL | Status: DC
  Administered 2023-06-04 – 2023-06-05 (×5): 80 mg via ORAL
  Filled 2023-06-03 (×8): qty 1

## 2023-06-03 MED ORDER — SENNOSIDES-DOCUSATE SODIUM 8.6-50 MG PO TABS
2.0000 | ORAL_TABLET | ORAL | Status: DC
  Filled 2023-06-03 (×3): qty 2

## 2023-06-03 MED ORDER — BUPIVACAINE IN DEXTROSE 0.75-8.25 % IT SOLN
INTRATHECAL | Status: AC
  Filled 2023-06-03: qty 2

## 2023-06-03 MED ORDER — LIDOCAINE-EPINEPHRINE (PF) 2 %-1:200000 IJ SOLN
INTRAMUSCULAR | Status: AC
  Filled 2023-06-03: qty 20

## 2023-06-03 MED ORDER — WITCH HAZEL-GLYCERIN EX PADS: 1.0000 | MEDICATED_PAD | CUTANEOUS | Status: DC | PRN

## 2023-06-03 MED ORDER — SOD CITRATE-CITRIC ACID 500-334 MG/5ML PO SOLN
30.0000 mL | Freq: Once | ORAL | Status: AC
  Administered 2023-06-03: 30 mL via ORAL

## 2023-06-03 MED ORDER — MENTHOL 3 MG MT LOZG: 1.0000 | LOZENGE | OROMUCOSAL | Status: DC | PRN

## 2023-06-03 MED ORDER — TRANEXAMIC ACID-NACL 1000-0.7 MG/100ML-% IV SOLN
INTRAVENOUS | Status: AC
  Filled 2023-06-03: qty 100

## 2023-06-03 MED ORDER — METFORMIN HCL 500 MG PO TABS
500.0000 mg | ORAL_TABLET | Freq: Two times a day (BID) | ORAL | Status: DC
  Administered 2023-06-03 – 2023-06-06 (×6): 500 mg via ORAL
  Filled 2023-06-03 (×6): qty 1

## 2023-06-03 MED ORDER — SOD CITRATE-CITRIC ACID 500-334 MG/5ML PO SOLN: 30.0000 mL | ORAL | Status: DC

## 2023-06-03 MED ORDER — HYDROMORPHONE HCL 1 MG/ML IJ SOLN: 0.2000 mg | INTRAMUSCULAR | Status: DC | PRN

## 2023-06-03 MED ORDER — SIMETHICONE 80 MG PO CHEW: 80.0000 mg | CHEWABLE_TABLET | ORAL | Status: DC | PRN

## 2023-06-03 MED ORDER — DIBUCAINE (PERIANAL) 1 % EX OINT: 1.0000 | TOPICAL_OINTMENT | CUTANEOUS | Status: DC | PRN

## 2023-06-03 MED ORDER — ONDANSETRON HCL 4 MG/2ML IJ SOLN
INTRAMUSCULAR | Status: DC | PRN
  Administered 2023-06-03: 4 mg via INTRAVENOUS

## 2023-06-03 MED ORDER — MEPERIDINE HCL 25 MG/ML IJ SOLN: 6.2500 mg | INTRAMUSCULAR | Status: DC | PRN

## 2023-06-03 MED ORDER — MORPHINE SULFATE (PF) 0.5 MG/ML IJ SOLN
INTRAMUSCULAR | Status: DC | PRN
  Administered 2023-06-03: .15 mg via INTRATHECAL

## 2023-06-03 MED ORDER — DIPHENHYDRAMINE HCL 50 MG/ML IJ SOLN: 12.5000 mg | INTRAMUSCULAR | Status: DC | PRN

## 2023-06-03 MED ORDER — ACETAMINOPHEN 10 MG/ML IV SOLN
INTRAVENOUS | Status: AC
  Filled 2023-06-03: qty 100

## 2023-06-03 MED ORDER — FENTANYL CITRATE (PF) 100 MCG/2ML IJ SOLN: 25.0000 ug | INTRAMUSCULAR | Status: DC | PRN

## 2023-06-03 MED ORDER — PHENYLEPHRINE HCL-NACL 20-0.9 MG/250ML-% IV SOLN
INTRAVENOUS | Status: DC | PRN
  Administered 2023-06-03: 30 ug/min via INTRAVENOUS

## 2023-06-03 MED ORDER — SODIUM CHLORIDE 0.9% FLUSH: 3.0000 mL | INTRAVENOUS | Status: DC | PRN

## 2023-06-03 MED ORDER — DIPHENHYDRAMINE HCL 25 MG PO CAPS: 25.0000 mg | ORAL_CAPSULE | Freq: Four times a day (QID) | ORAL | Status: DC | PRN

## 2023-06-03 SURGICAL SUPPLY — 36 items
APL PRP STRL LF DISP 70% ISPRP (MISCELLANEOUS) ×2
CANISTER PREVENA PLUS 150 (CANNISTER) IMPLANT
CHLORAPREP W/TINT 26 (MISCELLANEOUS) ×2 IMPLANT
CLAMP UMBILICAL CORD (MISCELLANEOUS) ×1 IMPLANT
CLOTH BEACON ORANGE TIMEOUT ST (SAFETY) ×1 IMPLANT
DRESSING PREVENA PLUS CUSTOM (GAUZE/BANDAGES/DRESSINGS) IMPLANT
DRSG OPSITE POSTOP 4X10 (GAUZE/BANDAGES/DRESSINGS) ×1 IMPLANT
DRSG PREVENA PLUS CUSTOM (GAUZE/BANDAGES/DRESSINGS) ×1
ELECT REM PT RETURN 9FT ADLT (ELECTROSURGICAL) ×1
ELECTRODE REM PT RTRN 9FT ADLT (ELECTROSURGICAL) ×1 IMPLANT
EXTRACTOR VACUUM KIWI (MISCELLANEOUS) IMPLANT
GLOVE BIOGEL PI IND STRL 7.0 (GLOVE) ×2 IMPLANT
GLOVE ECLIPSE 7.0 STRL STRAW (GLOVE) ×1 IMPLANT
GOWN STRL REUS W/TWL LRG LVL3 (GOWN DISPOSABLE) ×2 IMPLANT
KIT ABG SYR 3ML LUER SLIP (SYRINGE) ×1 IMPLANT
MAT PREVALON FULL STRYKER (MISCELLANEOUS) IMPLANT
NDL HYPO 25X5/8 SAFETYGLIDE (NEEDLE) ×1 IMPLANT
NEEDLE HYPO 25X5/8 SAFETYGLIDE (NEEDLE) ×1 IMPLANT
NS IRRIG 1000ML POUR BTL (IV SOLUTION) ×1 IMPLANT
PACK C SECTION WH (CUSTOM PROCEDURE TRAY) ×1 IMPLANT
PAD OB MATERNITY 4.3X12.25 (PERSONAL CARE ITEMS) ×1 IMPLANT
RETRACTOR TRAXI PANNICULUS (MISCELLANEOUS) IMPLANT
RTRCTR C-SECT PINK 25CM LRG (MISCELLANEOUS) ×1 IMPLANT
SUT MNCRL 0 VIOLET CTX 36 (SUTURE) ×1 IMPLANT
SUT PLAIN 0 NONE (SUTURE) IMPLANT
SUT PLAIN 2 0 (SUTURE)
SUT PLAIN 2 0 XLH (SUTURE) IMPLANT
SUT PLAIN ABS 2-0 CT1 27XMFL (SUTURE) IMPLANT
SUT VIC AB 0 CTX 36 (SUTURE) ×1
SUT VIC AB 0 CTX36XBRD ANBCTRL (SUTURE) ×1 IMPLANT
SUT VIC AB 2-0 CT1 27 (SUTURE)
SUT VIC AB 2-0 CT1 TAPERPNT 27 (SUTURE) IMPLANT
SUT VIC AB 4-0 KS 27 (SUTURE) ×1 IMPLANT
TOWEL OR 17X24 6PK STRL BLUE (TOWEL DISPOSABLE) ×1 IMPLANT
TRAY FOLEY W/BAG SLVR 14FR LF (SET/KITS/TRAYS/PACK) IMPLANT
WATER STERILE IRR 1000ML POUR (IV SOLUTION) ×1 IMPLANT

## 2023-06-03 NOTE — Anesthesia Procedure Notes (Signed)
Spinal  Patient location during procedure: OR Start time: 06/03/2023 10:05 AM End time: 06/03/2023 10:16 AM Reason for block: surgical anesthesia Staffing Performed: anesthesiologist  Anesthesiologist: Mal Amabile, MD Performed by: Mal Amabile, MD Authorized by: Mal Amabile, MD   Preanesthetic Checklist Completed: patient identified, IV checked, site marked, risks and benefits discussed, surgical consent, monitors and equipment checked, pre-op evaluation and timeout performed Spinal Block Patient position: sitting Prep: DuraPrep and site prepped and draped Patient monitoring: heart rate, cardiac monitor, continuous pulse ox and blood pressure Approach: midline Location: L3-4 Injection technique: single-shot Needle Needle type: Pencan  Needle gauge: 24 G Needle length: 9 cm Needle insertion depth: 7 cm Catheter at skin depth: 12 cm Assessment Sensory level: T4 Events: CSF return Additional Notes Epidural performed using LOR with air technique. No CSF, Heme or paresthesias. SAB performed through the epidural needle using 24ga Spinocan needle. CSF clear with free flow and no paresthesias. Local anesthetic and narcotics injected through the spinal needle and withdrawn. Epidural catheter threaded 5cm into the epidural space and the epidural needle was withdrawn. A sterile dressing was applied and the patient placed supine with LUD. The patient tolerated the procedure well and adequate sensory level was obtained.

## 2023-06-03 NOTE — Discharge Summary (Signed)
Postpartum Discharge Summary  Date of Service updated***     Patient Name: Amy Kim DOB: 12/08/1985 MRN: [id number]  Date of admission: 06/03/2023 Delivery date:06/03/2023 Delivering provider: Warden Fillers Date of discharge: 06/03/2023  Admitting diagnosis: History of cesarean delivery [Z98.891] Intrauterine pregnancy: [redacted]w[redacted]d     Secondary diagnosis:  Principal Problem:   History of cesarean delivery Active Problems:   history of c section x 5   Refusal of blood transfusions as patient is Jehovah's Witness   Essential hypertension   Type 2 diabetes mellitus (HCC)   Supervision of high risk pregnancy, antepartum   IUGR (intrauterine growth restriction) affecting care of mother   Advanced maternal age in multigravida, third trimester   Dehiscence or disruption of uterine wound in the puerperium  Additional problems: ***    Discharge diagnosis: Preterm Pregnancy Delivered, CHTN, and Type 2 DM                                              Post partum procedures: Nexplanon insertion *** Augmentation: N/A Complications: {OB Labor/Delivery Complications:20784}  Hospital course: Sceduled C/S   37 y.o. yo [id number] at [redacted]w[redacted]d was admitted to the hospital 06/03/2023 for scheduled cesarean section with the following indication: history of cesarean x4, IUGR, history of uterine window, cHTN, T2DM .Delivery details are as follows:  Membrane Rupture Time/Date: 10:36 AM,06/03/2023  Delivery Method:C-Section, Low Transverse Operative Delivery:N/A Details of operation can be found in separate operative note.  Patient had a postpartum course complicated by***.  She is ambulating, tolerating a regular diet, passing flatus, and urinating well. Patient is discharged home in stable condition on  06/03/23        #Hypertension Continued on labetalol 200 mg BID. Her BP's were ***.  #T2DM Resumed post-op on pre-pregnancy metformin 500 mg BID. Sugars were ***.  #Uterine window Including this delivery  she has had a uterine window noted at her last three deliveries. Recommended against getting pregnant again, and if she does to not deliver past 37 weeks.  Newborn Data: Birth date:06/03/2023 Birth time:10:36 AM Gender:Female Living status:Living Apgars:8 ,9  Weight:2000 g    Magnesium Sulfate received: No BMZ received: No Rhophylac:N/A MMR:N/A T-DaP:Given prenatally Flu: No Transfusion:No  Physical exam  Vitals:   06/03/23 0817 06/03/23 0819 06/03/23 0824  BP: 118/61    Pulse: 70    Resp: 15    Temp: 97.8 F (36.6 C)    TempSrc: Oral    Weight:  (!) 209.4 kg 95 kg  Height:  5\' 1"  (1.549 m)    General: {Exam; general:21111117} Lochia: {Desc; appropriate/inappropriate:30686::"appropriate"} Uterine Fundus: {Desc; firm/soft:30687} Incision: {Exam; incision:21111123} DVT Evaluation: {Exam; dvt:2111122} Labs: Lab Results  Component Value Date   WBC 10.0 05/31/2023   HGB 12.0 05/31/2023   HCT 37.5 05/31/2023   MCV 84.3 05/31/2023   PLT 457 (H) 05/31/2023      Latest Ref Rng & Units 05/31/2023    9:48 AM  CMP  Glucose 70 - 99 mg/dL 564   BUN 6 - 20 mg/dL 9   Creatinine 3.32 - 9.51 mg/dL 8.84   Sodium 166 - 063 mmol/L 132   Potassium 3.5 - 5.1 mmol/L 3.3   Chloride 98 - 111 mmol/L 100   CO2 22 - 32 mmol/L 23   Calcium 8.9 - 10.3 mg/dL 8.6   Total Protein 6.5 - 8.1 g/dL 6.8  Total Bilirubin 0.3 - 1.2 mg/dL 0.4   Alkaline Phos 38 - 126 U/L 128   AST 15 - 41 U/L 15   ALT 0 - 44 U/L 21    Edinburgh Score:    10/28/2018    3:56 PM  Edinburgh Postnatal Depression Scale Screening Tool  I have been able to laugh and see the funny side of things. 0  I have looked forward with enjoyment to things. 0  I have blamed myself unnecessarily when things went wrong. 0  I have been anxious or worried for no good reason. 0  I have felt scared or panicky for no good reason. 0  Things have been getting on top of me. 0  I have been so unhappy that I have had difficulty sleeping. 0   I have felt sad or miserable. 0  I have been so unhappy that I have been crying. 0  The thought of harming myself has occurred to me. 0  Edinburgh Postnatal Depression Scale Total 0     After visit meds:  Allergies as of 06/03/2023       Reactions   Aspirin    Hx of bleeding ulcers   Ibuprofen    Hx of bleeding ulcers     Med Rec must be completed prior to using this [facility]***        Discharge home in stable condition Infant Feeding: Breast Infant Disposition:home with mother Discharge instruction: per After Visit Summary and Postpartum booklet. Activity: Advance as tolerated. Pelvic rest for 6 weeks.  Diet: carb modified diet Future Appointments: Future Appointments  Date Time Provider Department Center  06/06/2023  9:35 AM Warden Fillers, MD [facility] None  06/13/2023  8:55 AM Constant, Gigi Gin, MD [facility] None  06/20/2023  9:35 AM Adam Phenix, MD [facility] None   Follow up Visit:   Please schedule this patient for a In person postpartum visit in 4 weeks with the following provider: MD. Additional Postpartum F/U:Incision check 1 week and BP check 1 week  High risk pregnancy complicated by: HTN and T2DM, IUGR, hx of cesarean x5 Delivery mode:  C-Section, Low Transverse Anticipated Birth Control:  PP Nexplanon placed***   06/03/2023 Venora Maples, MD

## 2023-06-03 NOTE — Lactation Note (Signed)
This note was copied from a baby's chart. Lactation Consultation Note  Patient Name: Amy Kim [id number] Date: 06/03/2023 Age:36 hours Reason for consult: Initial assessment;Infant < 6lbs;Late-preterm 34-36.6wks;Maternal endocrine disorder.See Birth Parent' MR: IUGR, C/S delivery, AMA  P5, Per Birth Parent,  infant recently BF for 15 minutes and afterwards was supplemented with 12 mls of EBM that she pumped at 2000 pm prior to [facility] entering the room. Birth Parent has 22  kcal formula but plans to offer her EBM first after latching infant at the breast before supplementing with formula. Birth Parent is following LPTI feeding guidelines, BF infant every 3 hours, and limiting chest and bottle feedings to 30 minutes or less, infant is using the [place] Bottle nipple. Birth Parent does not have any questions or concerns for LC at this time. LC discussed importance of maternal rest, diet and hydration. Birth Parent knows to call RN/LC for latch assistance if needed. Birth Parent was  made aware of O/P services, breastfeeding support groups, community resources, and our phone # for post-discharge questions.    Maternal Data Has patient been taught Hand Expression?: Yes Does the patient have breastfeeding experience prior to this delivery?: Yes How long did the patient breastfeed?: Per Birth Parent, she BF all 4 previous children for one year  Feeding Mother's Current Feeding Choice: Breast Milk  LATCH Score  LC did not observe latch, Infant had BF and was supplement with EBM prior to [facility] entering the room.                   Lactation Tools Discussed/Used Tools: Pump Breast pump type: Double-Electric Breast Pump Pump Education: Setup, frequency, and cleaning;Milk Storage Reason for Pumping: LPTI, less than 5 lbs, C/S delivery and IUGR Pumping frequency: Will continue to pump every 3 hours for 15 minutes on inital setting. Pumped volume: 12 mL (Per Birth Parent, she is expressing  12 mls or more per pumping session.)  Interventions Interventions: Breast feeding basics reviewed;Skin to skin;Guidelines for Milk Supply and Pumping Schedule Handout;LC Services brochure;LPT handout/interventions;CDC Guidelines for Breast Pump Cleaning;Education  Discharge Pump: DEBP  Consult Status Consult Status: Follow-up Date: 06/04/23 Follow-up type: In-patient    Frederico Hamman 06/03/2023, 8:41 PM

## 2023-06-03 NOTE — Transfer of Care (Signed)
Immediate Anesthesia Transfer of Care Note  Patient: Engineer, structural  Procedure(s) Performed: APPLICATION OF CELL SAVER CESAREAN SECTION  Patient Location: PACU  Anesthesia Type:Spinal and Epidural  Level of Consciousness: awake and alert   Airway & Oxygen Therapy: Patient Spontanous Breathing  Post-op Assessment: Report given to RN and Post -op Vital signs reviewed and stable  Post vital signs: Reviewed and stable  Last Vitals:  Vitals Value Taken Time  BP 127/82 06/03/23 1125  Temp    Pulse 83 06/03/23 1129  Resp 19 06/03/23 1129  SpO2 98 % 06/03/23 1129  Vitals shown include unfiled device data.  Last Pain:  Vitals:   06/03/23 0817  TempSrc: Oral      Patients Stated Pain Goal: 4 (06/03/23 0819)  Complications: No notable events documented.

## 2023-06-03 NOTE — H&P (Addendum)
LABOR AND DELIVERY ADMISSION HISTORY AND PHYSICAL NOTE  Amy Kim is a 37 y.o. female [phone] with IUP at [redacted]w[redacted]d presenting for repeat cesarean section in setting of history of prior uterine windows, severe IUGR, chronic hypertension, and type 2 DM.   Patient reports the fetal movement as active. Patient reports uterine contraction  activity as none. Patient reports  vaginal bleeding as none. Patient describes fluid per vagina as None.   She plans on breast feeding feeding. Her contraception plan is: Nexplanon.  Prenatal History/Complications: PNC at [facility]:  @[redacted]w[redacted]d , CWD, severe IUGR, cephalic presentation, anterior placenta, 2.2%ile with AC 1.3%, EFW 2071g, elevated cord dopplers, no REDF/AEDF  Pregnancy complications:  Patient Active Problem List   Diagnosis Date Noted   History of cesarean delivery 06/03/2023   Dehiscence or disruption of uterine wound in the puerperium 06/03/2023   Advanced maternal age in multigravida, third trimester 05/16/2023   IUGR (intrauterine growth restriction) affecting care of mother 05/10/2023   Supervision of high risk pregnancy, antepartum 11/23/2022   New onset type 2 diabetes mellitus (HCC) 01/13/2020   Thrombocytosis 01/13/2020   Essential hypertension 12/17/2019   Refusal of blood transfusions as patient is Jehovah's Witness 09/18/2018   history of c section x 4 03/24/2015    Past Medical History: Past Medical History:  Diagnosis Date   Back pain    DDD   Cancer (HCC)    adenocarcinoma of the colon   Diabetes (HCC)    Gastric ulcer 2021   History of migraine    last one 2 days ago   IDA (iron deficiency anemia)    Pregnancy induced hypertension    Refusal of blood transfusions as patient is Jehovah's Witness 09/18/2018   Urinary frequency     Past Surgical History: Past Surgical History:  Procedure Laterality Date   CESAREAN SECTION  2011   x 4 total   CESAREAN SECTION N/A 08/11/2015   Procedure: REPEAT CESAREAN  SECTION;  Surgeon: Levie Heritage, DO;  Location: [facility];  Service: Obstetrics;  Laterality: N/A;   CESAREAN SECTION N/A 09/19/2018   Procedure: CESAREAN SECTION;  Surgeon: Tereso Newcomer, MD;  Location: [facility];  Service: Obstetrics;  Laterality: N/A;   CESAREAN SECTION  2014   CHOLECYSTECTOMY N/A 03/21/2016   Procedure: LAPAROSCOPIC CHOLECYSTECTOMY;  Surgeon: Axel Filler, MD;  Location: [facility] OR;  Service: General;  Laterality: N/A;   COLONOSCOPY     adebocarcinoma,10/22    Obstetrical History: OB History     Gravida  6   Para  4   Term  4   Preterm  0   AB  1   Living  4      SAB  1   IAB  0   Ectopic  0   Multiple  0   Live Births  4           Social History: Social History   Socioeconomic History   Marital status: Married    Spouse name: Not on file   Number of children: Not on file   Years of education: Not on file   Highest education level: Not on file  Occupational History   Not on file  Tobacco Use   Smoking status: Never    Passive exposure: Never   Smokeless tobacco: Never  Vaping Use   Vaping status: Never Used  Substance and Sexual Activity   Alcohol use: Not Currently    Comment: occasionally   Drug use:  No   Sexual activity: Yes    Birth control/protection: None    Comment: Medroxyprogesterone   Other Topics Concern   Not on file  Social History Narrative   Not on file   Social Determinants of Health   Financial Resource Strain: Low Risk  (09/11/2018)   Overall Financial Resource Strain (CARDIA)    Difficulty of Paying Living Expenses: Not hard at all  Food Insecurity: No Food Insecurity (06/03/2023)   Hunger Vital Sign    Worried About Running Out of Food in the Last Year: Never true    Ran Out of Food in the Last Year: Never true  Transportation Needs: No Transportation Needs (06/03/2023)   PRAPARE - Administrator, Civil Service (Medical): No    Lack of Transportation (Non-Medical): No   Physical Activity: Not on file  Stress: No Stress Concern Present (09/11/2018)   [employer] of Occupational Health - Occupational Stress Questionnaire    Feeling of Stress : Only a little  Social Connections: Not on file    Family History: Family History  Problem Relation Age of Onset   Colon polyps Mother    Hypertension Mother    Hypertension Father    Heart disease Paternal Grandmother    Colon cancer Neg Hx    Esophageal cancer Neg Hx    Pancreatic cancer Neg Hx    Stomach cancer Neg Hx    Liver disease Neg Hx    Crohn's disease Neg Hx    Rectal cancer Neg Hx    Ulcerative colitis Neg Hx     Allergies: Allergies  Allergen Reactions   Aspirin     Hx of bleeding ulcers   Ibuprofen     Hx of bleeding ulcers    Medications Prior to Admission  Medication Sig Dispense Refill Last Dose   Prenatal MV & Min w/FA-DHA (PRENATAL GUMMIES) 0.18-25 MG CHEW Chew 2 each by mouth daily.      insulin glargine (LANTUS) 100 UNIT/ML Solostar Pen Inject 6 Units into the skin 2 (two) times daily. Give first thing in the morning & right before you go to bed (Patient not taking: Reported on 05/31/2023) 15 mL 0 Not Taking   Insulin Pen Needle (PEN NEEDLES) 33G X 4 MM MISC Please use as directed 100 each 5    labetalol (NORMODYNE) 200 MG tablet Take 1 tablet (200 mg total) by mouth 2 (two) times daily. 60 tablet 6 05/31/2023 at 0800   metFORMIN (GLUCOPHAGE) 1000 MG tablet Take 1 tablet (1,000 mg total) by mouth 2 (two) times daily with a meal. (Patient not taking: Reported on 05/31/2023) 30 tablet 0 05/31/2023 at 0800     Review of Systems  All systems reviewed and negative except as stated in HPI  Physical Exam BP 118/61   Pulse 70   Temp 97.8 F (36.6 C) (Oral)   Resp 15   Ht 5\' 1"  (1.549 m)   Wt 95 kg   LMP 09/22/2022   BMI 39.57 kg/m   Physical Exam Constitutional:      General: She is not in acute distress.    Appearance: Normal appearance. She is not ill-appearing.  HENT:      Head: Atraumatic.  Eyes:     General: No scleral icterus.    Conjunctiva/sclera: Conjunctivae normal.  Pulmonary:     Effort: Pulmonary effort is normal.  Skin:    General: Skin is warm and dry.     Coloration: Skin is not  jaundiced or pale.  Neurological:     Mental Status: She is alert.     Coordination: Coordination normal.  Psychiatric:        Mood and Affect: Mood normal.        Behavior: Behavior normal.     FHR: 131 bpm  Prenatal labs: ABO, Rh: --/--/B POS Performed at [facility], [address] [place], [place] [address]  [phone]) Antibody: NEG Performed at [facility], [address] [place], [place] [address]  [phone]) Rubella: 1.10 (03/01 0918) RPR: NON REACTIVE (09/06 0948)  HBsAg: Negative (03/01 0918)  HIV: Non Reactive (07/09 0941)  GC/Chlamydia:  Neisseria Gonorrhea  Date Value Ref Range Status  11/23/2022 Negative  Final   Chlamydia  Date Value Ref Range Status  11/23/2022 Negative  Final   GBS:    Prenatal Transfer Tool  Maternal Diabetes: Yes:  Diabetes Type:  Pre-pregnancy, Insulin/Medication controlled Genetic Screening: Normal Maternal Ultrasounds/Referrals: IUGR Fetal Ultrasounds or other Referrals:  Referred to Materal Fetal Medicine  Maternal Substance Abuse:  No Significant Maternal Medications:  Meds include: Other: Metformin, insulin, labetalol Significant Maternal Lab Results: Other:   Results for orders placed or performed during the hospital encounter of 06/03/23 (from the past 24 hour(s))  Glucose, capillary   Collection Time: 06/03/23  8:36 AM  Result Value Ref Range   Glucose-Capillary 139 (H) 70 - 99 mg/dL    Assessment: Amy Kim is a 37 y.o. [id number] at [redacted]w[redacted]d here for scheduled cesarean section.  #Repeat Low Transverse Cesarean  #History of uterine windows (noted at 3rd and 4th cesareans) The risks of cesarean section discussed with the patient included but were not limited to:  bleeding which may require transfusion or reoperation; infection which may require antibiotics; injury to bowel, bladder, ureters or other surrounding organs; injury to the fetus; need for additional procedures including hysterectomy in the event of a life-threatening hemorrhage; placental abnormalities with subsequent pregnancies, incisional problems, thromboembolic phenomenon and other postoperative/anesthesia complications. The patient concurred with the proposed plan, giving informed written consent for the procedure. Patient has been NPO since last night she will remain NPO for procedure. Anesthesia and OR aware. Preoperative prophylactic antibiotics and SCDs ordered on call to the OR.   #Anesthesia: CSE #FWB: FHR 131 bpm #GBS/ID: Unknown and Culture pending #MOF: breast feeding #MOC:  Nexplanon #Circ: Yes  #cHTN: well controlled on labetalol 200 BID  #T2DM: qid sugars postpartum, ctm for needs  #Jehovah's Witness: declines blood products. Plan to use cell saver in OR. Understands that refusal can lead to risk of severe injury/morbidity or death.    Venora Maples, MD/MPH Attending Family Medicine Physician, [facility] for [facility], [facility]  06/03/2023, 9:16 AM

## 2023-06-03 NOTE — Anesthesia Postprocedure Evaluation (Signed)
Anesthesia Post Note  Patient: Engineer, structural  Procedure(s) Performed: APPLICATION OF CELL SAVER CESAREAN SECTION     Patient location during evaluation: PACU Anesthesia Type: Spinal Level of consciousness: oriented and awake and alert Pain management: pain level controlled Vital Signs Assessment: post-procedure vital signs reviewed and stable Respiratory status: spontaneous breathing, respiratory function stable and nonlabored ventilation Cardiovascular status: blood pressure returned to baseline and stable Postop Assessment: no headache, no backache, no apparent nausea or vomiting, spinal receding and patient able to bend at knees Anesthetic complications: no   No notable events documented.  Last Vitals:  Vitals:   06/03/23 1215 06/03/23 1230  BP: 130/86 123/78  Pulse: 75 70  Resp: 18 20  Temp:  36.4 C  SpO2: 98% 100%    Last Pain:  Vitals:   06/03/23 1230  TempSrc: Oral  PainSc:    Pain Goal: Patients Stated Pain Goal: 4 (06/03/23 1200)  LLE Motor Response: Non-purposeful movement (06/03/23 1215) LLE Sensation: Tingling (06/03/23 1215) RLE Motor Response: Non-purposeful movement (06/03/23 1215) RLE Sensation: Tingling (06/03/23 1215) L Sensory Level: L3-Anterior knee, lower leg (06/03/23 1215) R Sensory Level: L3-Anterior knee, lower leg (06/03/23 1215) Epidural/Spinal Function Cutaneous sensation: Able to Discern Pressure (06/03/23 1200), Patient able to flex knees: Yes (06/03/23 1200), Patient able to lift hips off bed: Yes (06/03/23 1200), Back pain beyond tenderness at insertion site: No (06/03/23 1200), Progressively worsening motor and/or sensory loss: No (06/03/23 1200), Bowel and/or bladder incontinence post epidural: No (06/03/23 1200)  Kavya Haag A.

## 2023-06-03 NOTE — Op Note (Signed)
Operative Note   Patient: Amy Kim  Date of Procedure: 06/03/2023  Procedure: Repeat Low Transverse Cesarean   Indications: previous uterine incision: low transverse x4, and history of uterine window, severe IUGR, chronic hypertension on medications, type 2 diabetes mellitus  Pre-operative Diagnosis: previous cesarean section.   Post-operative Diagnosis: Same  TOLAC Candidate: No  Surgeon: Surgeons and Role:    * Venora Maples, MD - Primary    * Warden Fillers, MD - Assisting  An experienced assistant was required given the standard of surgical care given the complexity of the case.  This assistant was needed for exposure, dissection, suctioning, retraction, instrument exchange, assisting with delivery with administration of fundal pressure, and for overall help during the procedure.   Anesthesia: CSE  Anesthesiologist: Mal Amabile, MD   Antibiotics: Cefazolin   Estimated Blood Loss: 149 ml   Total IV Fluids: 1000 ml  Urine Output:  200 cc OF clear urine  Specimens: none   Complications: no complications   Indications: Nayla Diponio is a 37 y.o. [id number] with an IUP [redacted]w[redacted]d presenting for scheduled cesarean secondary to the indications listed above. Clinical course notable for development of IUGR with infant measuring in 2% and AC 1%, abnormal cord dopplers, and comorbidities of type 2 DM and chronic hypertension. In addition patient had uterine windows noted during both of her two last cesareans. She was counseled pre-operatively on the risk of future pregnancies and advised that bilateral tubal ligation would be in her benefit, both to prevent future pregnancies as well as for the added benefit of reduction of ovarian cancer risk. After discussion she declined due to family members who had worsening dysfunctional uterine bleeding after the procedure, and she would prefer to get a nexplanon while on the post partum unit.  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 She was counseled on the increased risk of morbidity and mortality prior to surgery.  The risks of cesarean section discussed with the patient included but were not limited to: bleeding which may require transfusion or reoperation; infection which may require antibiotics; injury to bowel, bladder, ureters or other surrounding organs; injury to the fetus; need for additional procedures including hysterectomy in the event of a life-threatening hemorrhage; placental abnormalities with subsequent pregnancies, incisional problems, thromboembolic phenomenon and other postoperative/anesthesia complications. The patient concurred with the proposed plan, giving informed written consent for the procedure. Patient has been NPO since last night she will remain NPO for procedure. Anesthesia and OR aware. Preoperative prophylactic antibiotics and SCDs ordered on call to the OR.   Findings: Viable infant in cephalic presentation, no nuchal cord present. Apgars 8, 9, . Weight 2000 g. Clear amniotic fluid. Normal placenta, three vessel cord. Normal uterus, adnexa and ovaries not examined to minimize bleeding. Significant adhesive disease of the anterior abdominal wall with omentum appearing in the midline prior to incising the lateral areas of the fascia. There was significant scarring in the fascial layer bilaterally. Intra-abdominally there was minimal adhesive disease. A small uterine window was appreciated in the lower uterine segment.  Procedure Details: A Time Out was held and the above information confirmed. The patient received intravenous antibiotics and had sequential compression devices applied to her lower extremities preoperatively. The patient was taken back to the operative suite where combined spinal-epidural anesthesia was administered. After induction of anesthesia, the patient was draped and prepped in the usual sterile manner and placed in a dorsal supine position  with a leftward tilt. A  low transverse skin incision was made (just superior to the prior scar to avoid the pannus fold) with scalpel and carried down through the subcutaneous tissue to the fascia. Omentum appeared in the midline prior to any obvious incision of the fascia. Fascial incision was made and extended transversely with combination of sharp dissection and bovie, taking care to ensure no underlying structures were affected. The rectus muscles were separated in the midline bluntly and the peritoneum was entered bluntly. An Alexis retractor was placed to aid in visualization of the uterus. A bladder flap was not developed. A high transverse uterine incision was made. The infant was successfully delivered from cephalic presentation, the umbilical cord was clamped after 1 minute. Cord ph was not sent, and cord blood was obtained for evaluation. The placenta was removed Intact and appeared normal. The uterine incision was closed with a single layer running unlocked suture of 0-Monocryl. Due to ongoing bleeding a second layer of 0 Monocryl was placed in an imbricating fashion, after which there was excellent hemostasis. The abdomen and the pelvis were cleared of all clot and debris and the Jon Gills was removed. Hemostasis was confirmed on all surfaces.  The peritoneum was was not reapproximated. The fascia was then closed using 0 Vicryl in a running fashion. The subcutaneous layer was reapproximated with 2-0 plain gut suture. The skin was closed with a 4-0 vicryl subcuticular stitch. The patient tolerated the procedure well. Sponge, lap, instrument and needle counts were correct x 2. She was taken to the recovery room in stable condition.  Disposition: PACU - hemodynamically stable.    Signed: Venora Maples, MD/MPH Attending Family Medicine Physician, [facility] for [facility], [facility]

## 2023-06-04 LAB — GLUCOSE, CAPILLARY
Glucose-Capillary: 112 mg/dL — ABNORMAL HIGH (ref 70–99)
Glucose-Capillary: 121 mg/dL — ABNORMAL HIGH (ref 70–99)
Glucose-Capillary: 128 mg/dL — ABNORMAL HIGH (ref 70–99)
Glucose-Capillary: 145 mg/dL — ABNORMAL HIGH (ref 70–99)

## 2023-06-04 LAB — CBC
HCT: 28.2 % — ABNORMAL LOW (ref 36.0–46.0)
Hemoglobin: 9.3 g/dL — ABNORMAL LOW (ref 12.0–15.0)
MCH: 26.5 pg (ref 26.0–34.0)
MCHC: 33 g/dL (ref 30.0–36.0)
MCV: 80.3 fL (ref 80.0–100.0)
Platelets: 344 10*3/uL (ref 150–400)
RBC: 3.51 MIL/uL — ABNORMAL LOW (ref 3.87–5.11)
RDW: 13.2 % (ref 11.5–15.5)
WBC: 10.6 10*3/uL — ABNORMAL HIGH (ref 4.0–10.5)
nRBC: 0 % (ref 0.0–0.2)

## 2023-06-04 LAB — NO BLOOD PRODUCTS

## 2023-06-04 MED ORDER — POLYSACCHARIDE IRON COMPLEX 150 MG PO CAPS
150.0000 mg | ORAL_CAPSULE | ORAL | Status: DC
  Administered 2023-06-04 – 2023-06-06 (×2): 150 mg via ORAL
  Filled 2023-06-04 (×2): qty 1

## 2023-06-04 MED ORDER — FUROSEMIDE 20 MG PO TABS
20.0000 mg | ORAL_TABLET | Freq: Every day | ORAL | Status: DC
  Administered 2023-06-04 – 2023-06-06 (×3): 20 mg via ORAL
  Filled 2023-06-04 (×3): qty 1

## 2023-06-04 MED ORDER — AMLODIPINE BESYLATE 5 MG PO TABS
10.0000 mg | ORAL_TABLET | Freq: Every day | ORAL | Status: DC
  Administered 2023-06-05 – 2023-06-06 (×2): 10 mg via ORAL
  Filled 2023-06-04 (×2): qty 2

## 2023-06-04 NOTE — Progress Notes (Addendum)
POSTPARTUM PROGRESS NOTE  Subjective: Amy Kim is a 37 y.o. [id number] s/p rLTCSx5 at [redacted]w[redacted]d.  She reports she doing well. No acute events overnight. She denies any problems with ambulating, voiding or po intake. Denies nausea or vomiting. She has  passed flatus. Pain is well controlled.  Lochia is appropriate.  Objective: Blood pressure 108/71, pulse 81, temperature 98.1 F (36.7 C), temperature source Oral, resp. rate 16, height 5\' 1"  (1.549 m), weight 95 kg, last menstrual period 09/22/2022, SpO2 99%, unknown if currently breastfeeding.  Physical Exam:  General: alert, cooperative and no distress Chest: no respiratory distress Abdomen: soft, non-tender  Uterine Fundus: firm and at level of umbilicus Extremities: No calf swelling or tenderness  minimal edema  Recent Labs    06/04/23 0659  HGB 9.3*  HCT 28.2*    Assessment/Plan: Lissett Fought is a 37 y.o. [id number] s/p rLTCS at [redacted]w[redacted]d for prior uterine windows, severe IUGR, cHTN, DMII.  Routine Postpartum Care: Doing well, pain well-controlled.  -- Continue routine care, lactation support  -- Contraception: nexplanon OP -- Feeding: breast  #cHTN: Normotensive during admission  - will stop labetalol and resume pre-preg amlodipine 10 - start lasix 20 mg x 5 days  #DMII: CBGs range 123-170s.  - continue metformin 500 mg BID which was pre-pregnancy dose - ctm   #Anemia: PP Hbg 9.3  - PO iron every other day   Dispo: Plan for discharge tomorrow or 9/12.  Penne Lash, MD Faculty Practice, Center for [facility] 06/04/2023 8:31 AM  Attestation of Attending Supervision of Provider:  Evaluation and management procedures were performed by this provider under my supervision and collaboration. I have reviewed the provider's note and chart, and I agree with the management and plan.   Shonna Chock, MD Faculty Practice, [facility]

## 2023-06-04 NOTE — Lactation Note (Signed)
This note was copied from a baby's chart. Lactation Consultation Note  Patient Name: Amy Kim [id number] Date: 06/04/2023 Age:36 hours Reason for consult: Follow-up assessment;Late-preterm 34-36.6wks;Infant < 6lbs, weight loss  P5, [redacted]w[redacted]d GA, LPI, 6.3% weight loss  Mother receptive to [facility] visit. Baby was sleeping skin to skin on mother's chest. She reports he latches and breast feeds well. Mother is pumping following breastfeeding and  collecting 20 ml. Mother then bottle feeds her EBM to baby. Mother does not have a breast pump for home yet. Referral for [facility] pump sent. Mother has a manual pump for home, part of the pump kit.  Mother is following the Feeding Guidelines for LPI and low birth weight infants.     Feeding Mother's Current Feeding Choice: Breast Milk   Lactation Tools Discussed/Used Pumping frequency: pumping after breastfeeding to collect EBM to feed to baby for additional calories Pumped volume: 20 mL  Interventions Interventions: Education  Discharge Pump:  (referral sent at mother's request for a Stork pump)  Consult Status Consult Status: Follow-up Date: 06/05/23 Follow-up type: In-patient    [name] 06/04/2023, 3:04 PM

## 2023-06-04 NOTE — Progress Notes (Signed)
Circumcision Consent   Discussed with mom at bedside about circumcision.    Circumcision is a surgery that removes the skin that covers the tip of the penis, called the "foreskin." Circumcision is usually done when a boy is between 53 and 67 days old, sometimes up to 52-64 weeks old.   The most common reasons boys are circumcised include for cultural/religious beliefs or for parental preference (potentially easier to clean, so baby looks like daddy, etc).   There may be some medical benefits for circumcision:    Circumcised boys seem to have slightly lower rates of: ? Urinary tract infections (per the American Academy of Pediatrics an uncircumcised boy has a 1/100 chance of developing a UTI in the first year of life, a circumcised boy at a 09/998 chance of developing a UTI in the first year of life- a 10% reduction) ? Penis cancer (typically rare- an uncircumcised female has a 1 in 100,000 chance of developing cancer of the penis) ? Sexually transmitted infection (in endemic areas, including HIV, HPV and Herpes- circumcision does NOT protect against gonorrhea, chlamydia, trachomatis, or syphilis) ? Phimosis: a condition where that makes retraction of the foreskin over the glans impossible (0.4 per 1000 boys per year or 0.6% of boys are affected by their 15th birthday)   Boys and men who are not circumcised can reduce these extra risks by: ? Cleaning their penis well ? Using condoms during sex   What are the risks of circumcision?   As with any surgical procedure, there are risks and complications. In circumcision, complications are rare and usually minor, the most common being: ? Bleeding- risk is reduced by holding each clamp for 30 seconds prior to a cut being made, and by holding pressure after the procedure is done ? Infection- the penis is cleaned prior to the procedure, and the procedure is done under sterile technique ? Damage to the urethra or amputation of the penis   How is  circumcision done in baby boys?   The baby will be placed on a special table and the legs restrained for their safety. Numbing medication is injected into the penis, and the skin is cleansed with betadine to decrease the risk of infection.    What to expect:   The penis will look red and raw for 5-7 days as it heals. We expect scabbing around where the cut was made, as well as clear-pink fluid and some swelling of the penis right after the procedure. If your baby's circumcision starts to bleed or develops pus, please contact your pediatrician immediately.   All questions were answered and mother consented.     Hal Morales, MD [facility], Center for [facility]

## 2023-06-04 NOTE — Lactation Note (Signed)
This note was copied from a baby's chart. Lactation Consultation Note  Patient Name: Amy Kim [id number] Date: 06/04/2023 Age:37 hours  Medela Max Flow stork breast pump was taken to mother by Diplomatic Services operational officer.  [name] 06/04/2023, 4:54 PM

## 2023-06-05 LAB — GLUCOSE, CAPILLARY: Glucose-Capillary: 107 mg/dL — ABNORMAL HIGH (ref 70–99)

## 2023-06-05 MED ORDER — HYDROCODONE-ACETAMINOPHEN 5-325 MG PO TABS: 1.0000 | ORAL_TABLET | ORAL | 0 refills | Status: DC | PRN

## 2023-06-05 MED ORDER — ACETAMINOPHEN 500 MG PO TABS: 1000.0000 mg | ORAL_TABLET | Freq: Four times a day (QID) | ORAL | 0 refills | Status: AC

## 2023-06-05 MED ORDER — FUROSEMIDE 20 MG PO TABS: 20.0000 mg | ORAL_TABLET | Freq: Every day | ORAL | 0 refills | Status: DC

## 2023-06-05 MED ORDER — POLYSACCHARIDE IRON COMPLEX 150 MG PO CAPS: 150.0000 mg | ORAL_CAPSULE | ORAL | 0 refills | Status: DC

## 2023-06-05 MED ORDER — SENNOSIDES-DOCUSATE SODIUM 8.6-50 MG PO TABS: 2.0000 | ORAL_TABLET | ORAL | Status: DC

## 2023-06-05 MED ORDER — AMLODIPINE BESYLATE 10 MG PO TABS: 10.0000 mg | ORAL_TABLET | Freq: Every day | ORAL | 0 refills | Status: DC

## 2023-06-05 MED ORDER — METFORMIN HCL 1000 MG PO TABS: 500.0000 mg | ORAL_TABLET | Freq: Two times a day (BID) | ORAL | 0 refills | Status: DC

## 2023-06-05 MED ORDER — WITCH HAZEL-GLYCERIN EX PADS: 1.0000 | MEDICATED_PAD | CUTANEOUS | Status: DC | PRN

## 2023-06-05 MED ORDER — COCONUT OIL OIL: 1.0000 | TOPICAL_OIL | Status: DC | PRN

## 2023-06-05 NOTE — Discharge Summary (Signed)
Postpartum Discharge Summary  Date of Service updated 06/05/2023     Patient Name: Amy Kim DOB: 07-18-1986 MRN: [id number]  Date of admission: 06/03/2023 Delivery date:06/03/2023 Delivering provider: Warden Fillers Date of discharge: 06/05/2023  Admitting diagnosis: History of cesarean delivery [Z98.891] Intrauterine pregnancy: [redacted]w[redacted]d     Secondary diagnosis:  Principal Problem:   History of cesarean delivery Active Problems:   history of c section x 5   Refusal of blood transfusions as patient is Jehovah's Witness   Essential hypertension   Type 2 diabetes mellitus (HCC)   Supervision of high risk pregnancy, antepartum   IUGR (intrauterine growth restriction) affecting care of mother   Advanced maternal age in multigravida, third trimester   Dehiscence or disruption of uterine wound in the puerperium  Additional problems: rLTCS at [redacted]w[redacted]d for prior uterine windows, severe IUGR, cHTN, DMII.     Discharge diagnosis: Preterm Pregnancy Delivered, CHTN, and Type 2 DM                                              Post partum procedures: None Augmentation: N/A Complications: None  Hospital course: Sceduled C/S   37 y.o. yo [id number] at [redacted]w[redacted]d was admitted to the hospital 06/03/2023 for scheduled cesarean section with the following indication: history of cesarean x4, IUGR, history of uterine window, cHTN, T2DM .Delivery details are as follows:  Membrane Rupture Time/Date: 10:36 AM,06/03/2023  Delivery Method:C-Section, Low Transverse Operative Delivery:N/A Details of operation can be found in separate operative note.  Patient had a postpartum course uncomplicated.  She is ambulating, tolerating a regular diet, passing flatus, and urinating well. Patient is discharged home in stable condition on  06/05/23        #Hypertension Started on Norvasc 10mg  every day for good BP control postpartum, with Lasix 20mg  x 5d total in addition.  #T2DM Resumed post-op on pre-pregnancy metformin 500 mg  BID. Sugars were 112-145 postpartum.  #Uterine window Including this delivery she has had a uterine window noted at her last three deliveries. Recommended against getting pregnant again, and if she does to not deliver past 37 weeks.  Newborn Data: Birth date:06/03/2023 Birth time:10:36 AM Gender:Female Living status:Living Apgars:8 ,9  Weight:2000 g    Magnesium Sulfate received: No BMZ received: No Rhophylac:N/A MMR:N/A T-DaP:Given prenatally Flu: No Transfusion:No  Physical exam  Vitals:   06/04/23 2034 06/05/23 0513 06/05/23 1434 06/05/23 2023  BP: 113/70 131/72 134/83 126/78  Pulse: 86 82 95 94  Resp: 16 17 18 18   Temp: 98.4 F (36.9 C) 98.2 F (36.8 C) 98.5 F (36.9 C) 98.2 F (36.8 C)  TempSrc: Oral Oral Oral Oral  SpO2: 99% 100%  100%  Weight:      Height:       General: alert, cooperative, and no distress Lochia: appropriate Uterine Fundus: firm Incision: No significant erythema, Dressing is clean, dry, and intact DVT Evaluation: No evidence of DVT seen on physical exam. Negative Homan's sign. Labs: Lab Results  Component Value Date   WBC 10.6 (H) 06/04/2023   HGB 9.3 (L) 06/04/2023   HCT 28.2 (L) 06/04/2023   MCV 80.3 06/04/2023   PLT 344 06/04/2023      Latest Ref Rng & Units 05/31/2023    9:48 AM  CMP  Glucose 70 - 99 mg/dL 284   BUN 6 - 20 mg/dL 9   Creatinine  0.44 - 1.00 mg/dL 0.86   Sodium 578 - 469 mmol/L 132   Potassium 3.5 - 5.1 mmol/L 3.3   Chloride 98 - 111 mmol/L 100   CO2 22 - 32 mmol/L 23   Calcium 8.9 - 10.3 mg/dL 8.6   Total Protein 6.5 - 8.1 g/dL 6.8   Total Bilirubin 0.3 - 1.2 mg/dL 0.4   Alkaline Phos 38 - 126 U/L 128   AST 15 - 41 U/L 15   ALT 0 - 44 U/L 21    Edinburgh Score:    06/05/2023    5:17 PM  Edinburgh Postnatal Depression Scale Screening Tool  I have been able to laugh and see the funny side of things. 0  I have looked forward with enjoyment to things. 0  I have blamed myself unnecessarily when things went  wrong. 0  I have been anxious or worried for no good reason. 0  I have felt scared or panicky for no good reason. 0  Things have been getting on top of me. 0  I have been so unhappy that I have had difficulty sleeping. 0  I have felt sad or miserable. 0  I have been so unhappy that I have been crying. 0  The thought of harming myself has occurred to me. 0  Edinburgh Postnatal Depression Scale Total 0     After visit meds:  Allergies as of 06/05/2023       Reactions   Aspirin    Hx of bleeding ulcers   Ibuprofen    Hx of bleeding ulcers        Medication List     TAKE these medications    acetaminophen 500 MG tablet Commonly known as: TYLENOL Take 2 tablets (1,000 mg total) by mouth every 6 (six) hours.   amLODipine 10 MG tablet Commonly known as: NORVASC Take 1 tablet (10 mg total) by mouth daily. Start taking on: June 06, 2023   coconut oil Oil Apply 1 Application topically as needed.   furosemide 20 MG tablet Commonly known as: LASIX Take 1 tablet (20 mg total) by mouth daily. Start taking on: June 06, 2023   HYDROcodone-acetaminophen 5-325 MG tablet Commonly known as: NORCO/VICODIN Take 1-2 tablets by mouth every 4 (four) hours as needed for moderate pain.   insulin glargine 100 UNIT/ML Solostar Pen Commonly known as: LANTUS Inject 6 Units into the skin 2 (two) times daily. Give first thing in the morning & right before you go to bed   iron polysaccharides 150 MG capsule Commonly known as: NIFEREX Take 1 capsule (150 mg total) by mouth every other day. Start taking on: June 06, 2023   labetalol 200 MG tablet Commonly known as: NORMODYNE Take 1 tablet (200 mg total) by mouth 2 (two) times daily.   metFORMIN 1000 MG tablet Commonly known as: GLUCOPHAGE Take 0.5 tablets (500 mg total) by mouth 2 (two) times daily with a meal. What changed: how much to take   Pen Needles 33G X 4 MM Misc Please use as directed   Prenatal Gummies  0.18-25 MG Chew Chew 2 each by mouth daily.   senna-docusate 8.6-50 MG tablet Commonly known as: Senokot-S Take 2 tablets by mouth daily. Start taking on: June 06, 2023   witch hazel-glycerin pad Commonly known as: TUCKS Apply 1 Application topically as needed for hemorrhoids.         Discharge home in stable condition Infant Feeding: Breast Infant Disposition:home with mother Discharge instruction: per After  Visit Summary and Postpartum booklet. Activity: Advance as tolerated. Pelvic rest for 6 weeks.  Diet: carb modified diet Future Appointments: Future Appointments  Date Time Provider Department Center  06/10/2023  1:40 PM [facility] NURSE [facility] None  07/02/2023  2:10 PM Hessie Dibble, MD [facility] None   Follow up Visit:  Please schedule this patient for a In person postpartum visit in 4 weeks with the following provider: MD. Additional Postpartum F/U:Incision check 1 week and BP check 1 week  High risk pregnancy complicated by: HTN and T2DM, IUGR, hx of cesarean x5 Delivery mode:  C-Section, Low Transverse Anticipated Birth Control: Out patient Nexplanon   06/05/2023 Arabella Merles, CNM

## 2023-06-05 NOTE — Progress Notes (Signed)
Amy Kim did not call for CBG prior to breakfast. RN reminded pt to call before lunch for CBG.

## 2023-06-06 ENCOUNTER — Encounter: Payer: Commercial Managed Care - HMO | Admitting: Obstetrics and Gynecology

## 2023-06-06 NOTE — Lactation Note (Signed)
This note was copied from a baby's chart. Lactation Consultation Note  Patient Name: Amy Kim [id number] Date: 06/06/2023 Age:36 hours Reason for consult: Follow-up assessment;Infant weight loss (9 % weight loss,) As LC entered the room the baby already latched with depth on the left breast. Per mom the baby had already fed on the right 15 mins. Total time at the breast 30 mins , LC noted several swallows and per mom comfortable.  Per mom the NP recommended adding milk fortifier to the breast milk for supplementing due to 9 % weight loss.  LC recommended feeding 20 - 30 mins 1st breast , supplement with EBM and fortifier and then post pump both breast , next feeding switch to the latch on the other breast and do the same until there is a consistent weight gain to protect the energy level of the baby.  LC reviewed the BF D/C teaching and LC resources.  LC offered to request and LC O/P and per mom preferred to call if needed.    Maternal Data    Feeding Mother's Current Feeding Choice: Breast Milk  LATCH Score Latch:  (latched with depth and flanged lips)  Audible Swallowing:  (swallows noted)     Comfort (Breast/Nipple):  (per mom comfortable)  Hold (Positioning):  (mom had latched the baby prior to [facility] entering the room)      Lactation Tools Discussed/Used Breast pump type: Double-Electric Breast Pump (mom has already been pumping - milk coming in) Pump Education: Milk Storage Pumped volume: 20 mL  Interventions Interventions: Breast feeding basics reviewed;DEBP;Hand pump;Education;LC Services brochure  Discharge Discharge Education: Engorgement and breast care;Warning signs for feeding baby;Outpatient recommendation;Other (comment) (per mom prefers to call on her own) Pump: Stork Pump;Personal;DEBP  Consult Status Consult Status: Complete Date: 06/06/23    Kathrin Greathouse 06/06/2023, 10:26 AM

## 2023-06-10 ENCOUNTER — Ambulatory Visit: Payer: Commercial Managed Care - HMO

## 2023-06-13 ENCOUNTER — Encounter: Payer: Commercial Managed Care - HMO | Admitting: Obstetrics and Gynecology

## 2023-06-13 MED FILL — Sodium Chloride IV Soln 0.9%: INTRAVENOUS | Qty: 1000 | Status: AC

## 2023-06-13 MED FILL — Heparin Sodium (Porcine) Inj 1000 Unit/ML: INTRAMUSCULAR | Qty: 30 | Status: AC

## 2023-06-20 ENCOUNTER — Encounter: Payer: Commercial Managed Care - HMO | Admitting: Obstetrics & Gynecology

## 2023-07-02 ENCOUNTER — Ambulatory Visit: Payer: Commercial Managed Care - HMO | Admitting: Family Medicine

## 2023-07-03 ENCOUNTER — Telehealth (HOSPITAL_COMMUNITY): Payer: Self-pay | Admitting: *Deleted

## 2023-07-03 NOTE — Telephone Encounter (Signed)
07/03/2023  Name: Amy Kim MRN: [id number] DOB: 1986-05-08  Reason for Call:  Transition of Care Hospital Discharge Call  Contact Status: Patient Contact Status: Message  Language assistant needed:          Follow-Up Questions:    Inocente Salles Postnatal Depression Scale:  In the Past 7 Days:    PHQ2-9 Depression Scale:     Discharge Follow-up:    Post-discharge interventions: NA  Salena Saner, RN 07/03/2023 16:43

## 2023-07-29 ENCOUNTER — Ambulatory Visit: Payer: Medicaid Other | Admitting: Obstetrics and Gynecology

## 2023-07-29 ENCOUNTER — Encounter: Payer: Self-pay | Admitting: Obstetrics and Gynecology

## 2023-07-29 DIAGNOSIS — Z30017 Encounter for initial prescription of implantable subdermal contraceptive: Secondary | ICD-10-CM | POA: Diagnosis not present

## 2023-07-29 MED ORDER — ETONOGESTREL 68 MG ~~LOC~~ IMPL
68.0000 mg | DRUG_IMPLANT | Freq: Once | SUBCUTANEOUS | Status: AC
Start: 2023-07-29 — End: 2023-07-29
  Administered 2023-07-29: 68 mg via SUBCUTANEOUS

## 2023-07-29 NOTE — Progress Notes (Signed)
Post Partum Visit Note  Amy Kim is a 37 y.o. [phone] female who presents for a postpartum visit. She is 6 weeks postpartum following a repeat cesarean section.  I have fully reviewed the prenatal and intrapartum course. The delivery was at 36 gestational weeks.  Anesthesia: epidural and spinal. Postpartum course has been  good. Baby is doing well Yes. Baby is feeding by both breast and bottle - Enfamil Neuropro . Bleeding no bleeding. Bowel function is normal. Bladder function is normal. Patient is not sexually active. Contraception method is none. Postpartum depression screening: negative.. Patient has returned to work and her usual activities. She reports receiving ample assistance from her husband, mother and sister   The pregnancy intention screening data noted above was reviewed. Potential methods of contraception were discussed. The patient elected to proceed with No data recorded.   Edinburgh Postnatal Depression Scale - 07/29/23 0922       Edinburgh Postnatal Depression Scale:  In the Past 7 Days   I have been able to laugh and see the funny side of things. 0    I have looked forward with enjoyment to things. 0    I have blamed myself unnecessarily when things went wrong. 0    I have been anxious or worried for no good reason. 0    I have felt scared or panicky for no good reason. 0    Things have been getting on top of me. 0    I have been so unhappy that I have had difficulty sleeping. 0    I have felt sad or miserable. 0    I have been so unhappy that I have been crying. 0    The thought of harming myself has occurred to me. 0    Edinburgh Postnatal Depression Scale Total 0             Health Maintenance Due  Topic Date Due   FOOT EXAM  Never done   OPHTHALMOLOGY EXAM  09/20/2021   INFLUENZA VACCINE  04/25/2023   HEMOGLOBIN A1C  05/06/2023   COVID-19 Vaccine (4 - 2023-24 season) 05/26/2023       Review of Systems Pertinent items noted in HPI and  remainder of comprehensive ROS otherwise negative.  Objective:  BP (!) 148/97   Pulse 80   Wt 204 lb 4.8 oz (92.7 kg)   LMP 09/22/2022   Breastfeeding Yes   BMI 38.60 kg/m    General:  alert, cooperative, and no distress   Breasts:  normal  Lungs: clear to auscultation bilaterally  Heart:  regular rate and rhythm  Abdomen: soft, non-tender; bowel sounds normal; no masses,  no organomegaly   Wound well approximated incision  GU exam:  not indicated       Assessment:     Normal postpartum exam.   Plan:   Essential components of care per ACOG recommendations:  1.  Mood and well being: Patient with negative depression screening today. Reviewed local resources for support.  - Patient tobacco use? No.   - hx of drug use? No.    2. Infant care and feeding:  -Patient currently breastmilk feeding? Yes. Reviewed importance of draining breast regularly to support lactation.  -Social determinants of health (SDOH) reviewed in EPIC. No concerns  3. Sexuality, contraception and birth spacing - Patient does not want a pregnancy in the next year.  Desired family size is 5 children.  - Reviewed reproductive life planning. Reviewed contraceptive methods based on  pt preferences and effectiveness.  Patient desired Hormonal Implant today.   - Discussed birth spacing of 18 months and need for repeat cesarean section at 37 weeks due to uterine window seen on recent ultrasound  NEXPLANON INSERTION Patient given informed consent, signed copy in the chart, time out was performed. Pregnancy test was negative. Appropriate time out taken.  Patient's left arm was prepped and draped in the usual sterile fashion.. The ruler used to measure and mark insertion area.  Patient was prepped with alcohol swab and then injected with 3 cc of 1% lidocaine with epinephrine.  Patient was prepped with betadine, Nexplanon removed form packaging.  Device confirmed in needle, then inserted full length of needle and  withdrawn per handbook instructions.  Patient insertion site covered with a bandaid and Coband.   Minimal blood loss.  Patient tolerated the procedure well.   4. Sleep and fatigue -Encouraged family/partner/community support of 4 hrs of uninterrupted sleep to help with mood and fatigue  5. Physical Recovery  - Discussed patients delivery and complications. She describes her labor as good. - Patient had a C-section repeat; no problems after deliver.  - Patient has urinary incontinence? No. - Patient is safe to resume physical and sexual activity  6.  Health Maintenance - HM due items addressed Yes - Last pap smear  Diagnosis  Date Value Ref Range Status  12/26/2022   Final   - Negative for intraepithelial lesion or malignancy (NILM)   Pap smear not done at today's visit.  -Breast Cancer screening indicated? No.   7. Chronic Disease/Pregnancy Condition follow up: Hypertension and type 2 DM with PCP  - PCP follow up  Catalina Antigua, MD [facility], [facility]

## 2023-10-22 ENCOUNTER — Other Ambulatory Visit (HOSPITAL_COMMUNITY)
Admission: RE | Admit: 2023-10-22 | Discharge: 2023-10-22 | Disposition: A | Discharge status: Home / Self Care | Payer: Medicaid Other | Source: Ambulatory Visit | Attending: Family | Admitting: Family

## 2023-10-22 ENCOUNTER — Encounter: Payer: Self-pay | Admitting: Family

## 2023-10-22 ENCOUNTER — Ambulatory Visit (INDEPENDENT_AMBULATORY_CARE_PROVIDER_SITE_OTHER): Payer: Medicaid Other | Admitting: Family

## 2023-10-22 VITALS — BP 189/112 | HR 77 | Temp 98.6°F | Ht 61.0 in | Wt 201.6 lb

## 2023-10-22 DIAGNOSIS — I10 Essential (primary) hypertension: Secondary | ICD-10-CM

## 2023-10-22 DIAGNOSIS — Z114 Encounter for screening for human immunodeficiency virus [HIV]: Secondary | ICD-10-CM | POA: Diagnosis not present

## 2023-10-22 DIAGNOSIS — Z113 Encounter for screening for infections with a predominantly sexual mode of transmission: Secondary | ICD-10-CM

## 2023-10-22 MED ORDER — AMLODIPINE BESYLATE 10 MG PO TABS: 10.0000 mg | ORAL_TABLET | Freq: Every day | ORAL | 0 refills | Status: DC

## 2023-10-22 MED ORDER — VALSARTAN 40 MG PO TABS
40.0000 mg | ORAL_TABLET | Freq: Every day | ORAL | 1 refills | Status: DC
Start: 2023-10-22 — End: 2023-12-31

## 2023-10-22 NOTE — Progress Notes (Signed)
Patient states no other concerns besides the IUD and STD testing.

## 2023-10-22 NOTE — Progress Notes (Signed)
Patient ID: Amy Kim, female    DOB: 09-Apr-1986  MRN: [id number]  CC: Chronic Conditions Follow-Up  Subjective: Amy Kim is a 38 y.o. female who presents for chronic conditions follow-up.   Her concerns today include:  - Doing well on Amlodipine, no issues/concerns. States she does not take Amlodipine as prescribed due to forgetting and she did not take today. States Labetalol was discontinued by Obstetrics / Gynecology when she delivered her baby. She does not breastfeed. She does not complain of red flag symptoms such as but not limited to chest pain, shortness of breath, worst headache of life, nausea/vomiting.  - Established with Gynecology and plans to schedule an appointment with them soon for Nexplanon removal.  - Routine STD screening. Denies symptoms/recent exposure.    Patient Active Problem List   Diagnosis Date Noted   History of cesarean delivery 06/03/2023   Dehiscence or disruption of uterine wound in the puerperium 06/03/2023   Advanced maternal age in multigravida, third trimester 05/16/2023   IUGR (intrauterine growth restriction) affecting care of mother 05/10/2023   Supervision of high risk pregnancy, antepartum 11/23/2022   Type 2 diabetes mellitus (HCC) 01/13/2020   Thrombocytosis 01/13/2020   Essential hypertension 12/17/2019   Refusal of blood transfusions as patient is Jehovah's Witness 09/18/2018   history of c section x 5 03/24/2015     Current Outpatient Medications on File Prior to Visit  Medication Sig Dispense Refill   acetaminophen (TYLENOL) 500 MG tablet Take 2 tablets (1,000 mg total) by mouth every 6 (six) hours. 30 tablet 0   metFORMIN (GLUCOPHAGE) 1000 MG tablet Take 0.5 tablets (500 mg total) by mouth 2 (two) times daily with a meal. 30 tablet 0   coconut oil OIL Apply 1 Application topically as needed. (Patient not taking: Reported on 10/22/2023)     furosemide (LASIX) 20 MG tablet Take 1 tablet (20 mg total) by mouth daily.  (Patient not taking: Reported on 10/22/2023) 7 tablet 0   HYDROcodone-acetaminophen (NORCO/VICODIN) 5-325 MG tablet Take 1-2 tablets by mouth every 4 (four) hours as needed for moderate pain. (Patient not taking: Reported on 10/22/2023) 30 tablet 0   insulin glargine (LANTUS) 100 UNIT/ML Solostar Pen Inject 6 Units into the skin 2 (two) times daily. Give first thing in the morning & right before you go to bed (Patient not taking: Reported on 05/31/2023) 15 mL 0   Insulin Pen Needle (PEN NEEDLES) 33G X 4 MM MISC Please use as directed (Patient not taking: Reported on 07/29/2023) 100 each 5   iron polysaccharides (NIFEREX) 150 MG capsule Take 1 capsule (150 mg total) by mouth every other day. 15 capsule 0   Prenatal MV & Min w/FA-DHA (PRENATAL GUMMIES) 0.18-25 MG CHEW Chew 2 each by mouth daily. (Patient not taking: Reported on 07/29/2023)     senna-docusate (SENOKOT-S) 8.6-50 MG tablet Take 2 tablets by mouth daily. (Patient not taking: Reported on 10/22/2023)     witch hazel-glycerin (TUCKS) pad Apply 1 Application topically as needed for hemorrhoids. (Patient not taking: Reported on 10/22/2023)     No current facility-administered medications on file prior to visit.    Allergies  Allergen Reactions   Aspirin     Hx of bleeding ulcers   Ibuprofen     Hx of bleeding ulcers    Social History   Socioeconomic History   Marital status: Married    Spouse name: Not on file   Number of children: Not on file   Years  of education: Not on file   Highest education level: Some college, no degree  Occupational History   Not on file  Tobacco Use   Smoking status: Never    Passive exposure: Never   Smokeless tobacco: Never  Vaping Use   Vaping status: Never Used  Substance and Sexual Activity   Alcohol use: Not Currently    Comment: occasionally   Drug use: No   Sexual activity: Yes    Birth control/protection: None    Comment: Medroxyprogesterone   Other Topics Concern   Not on file  Social  History Narrative   Not on file   Social Drivers of Health   Financial Resource Strain: Low Risk  (10/21/2023)   Overall Financial Resource Strain (CARDIA)    Difficulty of Paying Living Expenses: Not very hard  Food Insecurity: No Food Insecurity (10/21/2023)   Hunger Vital Sign    Worried About Running Out of Food in the Last Year: Never true    Ran Out of Food in the Last Year: Never true  Transportation Needs: No Transportation Needs (10/21/2023)   PRAPARE - Administrator, Civil Service (Medical): No    Lack of Transportation (Non-Medical): No  Physical Activity: Insufficiently Active (10/21/2023)   Exercise Vital Sign    Days of Exercise per Week: 3 days    Minutes of Exercise per Session: 30 min  Stress: No Stress Concern Present (10/21/2023)   [employer] of Occupational Health - Occupational Stress Questionnaire    Feeling of Stress : Not at all  Social Connections: Unknown (10/21/2023)   Social Connection and Isolation Panel [NHANES]    Frequency of Communication with Friends and Family: More than three times a week    Frequency of Social Gatherings with Friends and Family: More than three times a week    Attends Religious Services: More than 4 times per year    Active Member of [employer] or Organizations: No    Attends Banker Meetings: Not on file    Marital Status: Patient declined  Intimate Partner Violence: Not At Risk (06/03/2023)   Humiliation, Afraid, Rape, and Kick questionnaire    Fear of Current or Ex-Partner: No    Emotionally Abused: No    Physically Abused: No    Sexually Abused: No    Family History  Problem Relation Age of Onset   Colon polyps Mother    Hypertension Mother    Hypertension Father    Heart disease Paternal Grandmother    Colon cancer Neg Hx    Esophageal cancer Neg Hx    Pancreatic cancer Neg Hx    Stomach cancer Neg Hx    Liver disease Neg Hx    Crohn's disease Neg Hx    Rectal cancer Neg Hx    Ulcerative  colitis Neg Hx     Past Surgical History:  Procedure Laterality Date   CESAREAN SECTION  2011   x 4 total   CESAREAN SECTION N/A 08/11/2015   Procedure: REPEAT CESAREAN SECTION;  Surgeon: Levie Heritage, DO;  Location: [facility];  Service: Obstetrics;  Laterality: N/A;   CESAREAN SECTION N/A 09/19/2018   Procedure: CESAREAN SECTION;  Surgeon: Tereso Newcomer, MD;  Location: [facility];  Service: Obstetrics;  Laterality: N/A;   CESAREAN SECTION  2014   CESAREAN SECTION N/A 06/03/2023   Procedure: CESAREAN SECTION;  Surgeon: Venora Maples, MD;  Location: [facility];  Service: Obstetrics;  Laterality: N/A;  CHOLECYSTECTOMY N/A 03/21/2016   Procedure: LAPAROSCOPIC CHOLECYSTECTOMY;  Surgeon: Axel Filler, MD;  Location: [facility];  Service: General;  Laterality: N/A;   COLONOSCOPY     adebocarcinoma,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     Nose: Nose normal.     Mouth/Throat:     Mouth: Mucous membranes are moist.     Pharynx: Oropharynx is clear.  Eyes:     Extraocular Movements: Extraocular movements intact.     Conjunctiva/sclera: Conjunctivae normal.     Pupils: Pupils are equal, round, and reactive to light.  Cardiovascular:     Rate and Rhythm: Normal rate and regular rhythm.     Pulses: Normal pulses.     Heart sounds: Normal heart sounds.  Pulmonary:     Effort: Pulmonary effort is normal.     Breath sounds: Normal breath sounds.  Musculoskeletal:        General: Normal range of motion.     Cervical back: Normal range of motion and neck supple.  Neurological:     General: No focal deficit present.     Mental Status: She is alert and oriented to person, place, and time.  Psychiatric:        Mood and Affect: Mood  normal.        Behavior: Behavior normal.     ASSESSMENT AND PLAN: 1. Primary hypertension (Primary) - Blood pressure not at goal during today's visit. Patient asymptomatic without chest pressure, chest pain, palpitations, shortness of breath, worst headache of life, and any additional red flag symptoms. - Patient declined Hydralazine to be administered today in office due to has to pickup her children from school. - Continue Amlodipine as prescribed.  - Begin Valsartan as prescribed.  - Routine screening.  - Counseled on blood pressure goal of less than 130/80, low-sodium, DASH diet, medication compliance, and 150 minutes of moderate intensity exercise per week as tolerated. Counseled on medication adherence and adverse effects. - Follow-up with primary provider in 2 weeks or sooner if needed.  - amLODipine (NORVASC) 10 MG tablet; Take 1 tablet (10 mg total) by mouth daily.  Dispense: 90 tablet; Refill: 0 - valsartan (DIOVAN) 40 MG tablet; Take 1 tablet (40 mg total) by mouth daily.  Dispense: 30 tablet; Refill: 1 - Basic Metabolic Panel  2. Routine screening for STI (sexually transmitted infection) - Routine screening.  - Cervicovaginal ancillary only  3. Encounter for screening for HIV - Routine screening.  - HIV antibody (with reflex)    Patient was given the opportunity to ask questions.  Patient verbalized understanding of the plan and was able to repeat key elements of the plan. Patient was given clear instructions to go to Emergency Department or return to medical center if symptoms don't improve, worsen, or new problems develop.The patient verbalized understanding.   Orders Placed This Encounter  Procedures   Basic Metabolic Panel   HIV antibody (with reflex)     Requested Prescriptions   Signed Prescriptions Disp Refills   amLODipine (NORVASC) 10 MG tablet 90 tablet 0    Sig: Take 1 tablet (10 mg total) by mouth daily.  valsartan (DIOVAN) 40 MG tablet 30 tablet 1     Sig: Take 1 tablet (40 mg total) by mouth daily.    Return in about 2 weeks (around 11/05/2023) for Follow-Up or next available chronic conditions.  Rema Fendt, NP

## 2023-10-23 ENCOUNTER — Ambulatory Visit: Payer: Medicaid Other | Admitting: Family

## 2023-10-23 ENCOUNTER — Encounter: Payer: Self-pay | Admitting: Family

## 2023-10-23 LAB — BASIC METABOLIC PANEL
BUN/Creatinine Ratio: 17 (ref 9–23)
BUN: 12 mg/dL (ref 6–20)
CO2: 21 mmol/L (ref 20–29)
Calcium: 9.6 mg/dL (ref 8.7–10.2)
Chloride: 99 mmol/L (ref 96–106)
Creatinine, Ser: 0.7 mg/dL (ref 0.57–1.00)
Glucose: 95 mg/dL (ref 70–99)
Potassium: 4.2 mmol/L (ref 3.5–5.2)
Sodium: 138 mmol/L (ref 134–144)
eGFR: 115 mL/min/{1.73_m2} (ref >59)

## 2023-10-23 LAB — HIV ANTIBODY (ROUTINE TESTING W REFLEX): HIV Screen 4th Generation wRfx: NONREACTIVE

## 2023-10-24 LAB — CERVICOVAGINAL ANCILLARY ONLY
Bacterial Vaginitis (gardnerella): POSITIVE — AB
Candida Glabrata: NEGATIVE
Candida Vaginitis: NEGATIVE
Chlamydia: NEGATIVE
Comment: NEGATIVE
Comment: NEGATIVE
Comment: NEGATIVE
Comment: NEGATIVE
Comment: NEGATIVE
Comment: NORMAL
Neisseria Gonorrhea: NEGATIVE
Trichomonas: NEGATIVE

## 2023-10-24 NOTE — Telephone Encounter (Signed)
I called patient , she was filing for her work benefits and she was able to bypass entering a provider code. She is good now.

## 2023-10-25 ENCOUNTER — Other Ambulatory Visit: Payer: Self-pay | Admitting: Family

## 2023-10-25 DIAGNOSIS — I10 Essential (primary) hypertension: Secondary | ICD-10-CM

## 2023-10-25 DIAGNOSIS — N760 Acute vaginitis: Secondary | ICD-10-CM

## 2023-10-25 MED ORDER — METRONIDAZOLE 500 MG PO TABS: 500.0000 mg | ORAL_TABLET | Freq: Two times a day (BID) | ORAL | 0 refills | Status: AC

## 2023-11-05 ENCOUNTER — Ambulatory Visit (INDEPENDENT_AMBULATORY_CARE_PROVIDER_SITE_OTHER): Payer: Medicaid Other | Admitting: Family

## 2023-11-05 VITALS — BP 167/111 | HR 89 | Temp 98.4°F | Ht 61.0 in | Wt 205.0 lb

## 2023-11-05 DIAGNOSIS — I10 Essential (primary) hypertension: Secondary | ICD-10-CM | POA: Diagnosis not present

## 2023-11-05 NOTE — Progress Notes (Signed)
Patient ID: Amy Kim, female    DOB: 08-11-1986  MRN: [id number]  CC: Chronic Conditions Follow-Up  Subjective: Amy Kim is a 38 y.o. female who presents for chronic conditions follow-up.   Her concerns today include:  Reports since previous office visit she has not taken Amlodipine or Valsartan. States pharmacy had several health insurances on file and told her that it would take at least 24 hours to clear so that she can use current health insurance card. Reports today she spoke with pharmacy, she has a [employer] card that will cover cost of prescriptions, and will pickup after today's office visit. She does not complain of red flag symptoms such as but not limited to chest pain, shortness of breath, worst headache of life, nausea/vomiting.   Patient Active Problem List   Diagnosis Date Noted   History of cesarean delivery 06/03/2023   Dehiscence or disruption of uterine wound in the puerperium 06/03/2023   Advanced maternal age in multigravida, third trimester 05/16/2023   IUGR (intrauterine growth restriction) affecting care of mother 05/10/2023   Supervision of high risk pregnancy, antepartum 11/23/2022   Type 2 diabetes mellitus (HCC) 01/13/2020   Thrombocytosis 01/13/2020   Essential hypertension 12/17/2019   Refusal of blood transfusions as patient is Jehovah's Witness 09/18/2018   history of c section x 5 03/24/2015     Current Outpatient Medications on File Prior to Visit  Medication Sig Dispense Refill   acetaminophen (TYLENOL) 500 MG tablet Take 2 tablets (1,000 mg total) by mouth every 6 (six) hours. 30 tablet 0   amLODipine (NORVASC) 10 MG tablet Take 1 tablet (10 mg total) by mouth daily. (Patient not taking: Reported on 11/05/2023) 90 tablet 0   coconut oil OIL Apply 1 Application topically as needed. (Patient not taking: Reported on 07/29/2023)     furosemide (LASIX) 20 MG tablet Take 1 tablet (20 mg total) by mouth daily. (Patient not  taking: Reported on 07/29/2023) 7 tablet 0   HYDROcodone-acetaminophen (NORCO/VICODIN) 5-325 MG tablet Take 1-2 tablets by mouth every 4 (four) hours as needed for moderate pain. (Patient not taking: Reported on 07/29/2023) 30 tablet 0   insulin glargine (LANTUS) 100 UNIT/ML Solostar Pen Inject 6 Units into the skin 2 (two) times daily. Give first thing in the morning & right before you go to bed (Patient not taking: Reported on 05/31/2023) 15 mL 0   Insulin Pen Needle (PEN NEEDLES) 33G X 4 MM MISC Please use as directed (Patient not taking: Reported on 07/29/2023) 100 each 5   iron polysaccharides (NIFEREX) 150 MG capsule Take 1 capsule (150 mg total) by mouth every other day. 15 capsule 0   metFORMIN (GLUCOPHAGE) 1000 MG tablet Take 0.5 tablets (500 mg total) by mouth 2 (two) times daily with a meal. (Patient not taking: Reported on 11/05/2023) 30 tablet 0   Prenatal MV & Min w/FA-DHA (PRENATAL GUMMIES) 0.18-25 MG CHEW Chew 2 each by mouth daily. (Patient not taking: Reported on 07/29/2023)     senna-docusate (SENOKOT-S) 8.6-50 MG tablet Take 2 tablets by mouth daily. (Patient not taking: Reported on 07/29/2023)     valsartan (DIOVAN) 40 MG tablet Take 1 tablet (40 mg total) by mouth daily. (Patient not taking: Reported on 11/05/2023) 30 tablet 1   witch hazel-glycerin (TUCKS) pad Apply 1 Application topically as needed for hemorrhoids. (Patient not taking: Reported on 07/29/2023)     No current facility-administered medications on file prior to visit.    Allergies  Allergen Reactions   Aspirin     Hx of bleeding ulcers   Ibuprofen     Hx of bleeding ulcers    Social History   Socioeconomic History   Marital status: Married    Spouse name: Not on file   Number of children: Not on file   Years of education: Not on file   Highest education level: Some college, no degree  Occupational History   Not on file  Tobacco Use   Smoking status: Never    Passive exposure: Never   Smokeless tobacco:  Never  Vaping Use   Vaping status: Never Used  Substance and Sexual Activity   Alcohol use: Not Currently    Comment: occasionally   Drug use: No   Sexual activity: Yes    Birth control/protection: None    Comment: Medroxyprogesterone   Other Topics Concern   Not on file  Social History Narrative   Not on file   Social Drivers of Health   Financial Resource Strain: Low Risk  (10/21/2023)   Overall Financial Resource Strain (CARDIA)    Difficulty of Paying Living Expenses: Not very hard  Food Insecurity: No Food Insecurity (10/21/2023)   Hunger Vital Sign    Worried About Running Out of Food in the Last Year: Never true    Ran Out of Food in the Last Year: Never true  Transportation Needs: No Transportation Needs (10/21/2023)   PRAPARE - Administrator, Civil Service (Medical): No    Lack of Transportation (Non-Medical): No  Physical Activity: Insufficiently Active (10/21/2023)   Exercise Vital Sign    Days of Exercise per Week: 3 days    Minutes of Exercise per Session: 30 min  Stress: No Stress Concern Present (10/21/2023)   [employer] of Occupational Health - Occupational Stress Questionnaire    Feeling of Stress : Not at all  Social Connections: Unknown (10/21/2023)   Social Connection and Isolation Panel [NHANES]    Frequency of Communication with Friends and Family: More than three times a week    Frequency of Social Gatherings with Friends and Family: More than three times a week    Attends Religious Services: More than 4 times per year    Active Member of [employer] or Organizations: No    Attends Banker Meetings: Not on file    Marital Status: Patient declined  Intimate Partner Violence: Not At Risk (06/03/2023)   Humiliation, Afraid, Rape, and Kick questionnaire    Fear of Current or Ex-Partner: No    Emotionally Abused: No    Physically Abused: No    Sexually Abused: No    Family History  Problem Relation Age of Onset   Colon polyps  Mother    Hypertension Mother    Hypertension Father    Heart disease Paternal Grandmother    Colon cancer Neg Hx    Esophageal cancer Neg Hx    Pancreatic cancer Neg Hx    Stomach cancer Neg Hx    Liver disease Neg Hx    Crohn's disease Neg Hx    Rectal cancer Neg Hx    Ulcerative colitis Neg Hx     Past Surgical History:  Procedure Laterality Date   CESAREAN SECTION  2011   x 4 total   CESAREAN SECTION N/A 08/11/2015   Procedure: REPEAT CESAREAN SECTION;  Surgeon: Levie Heritage, DO;  Location: [facility];  Service: Obstetrics;  Laterality: N/A;   CESAREAN SECTION N/A 09/19/2018  Procedure: CESAREAN SECTION;  Surgeon: Tereso Newcomer, MD;  Location: [facility];  Service: Obstetrics;  Laterality: N/A;   CESAREAN SECTION  2014   CESAREAN SECTION N/A 06/03/2023   Procedure: CESAREAN SECTION;  Surgeon: Venora Maples, MD;  Location: [facility];  Service: Obstetrics;  Laterality: N/A;   CHOLECYSTECTOMY N/A 03/21/2016   Procedure: LAPAROSCOPIC CHOLECYSTECTOMY;  Surgeon: Axel Filler, MD;  Location: [facility];  Service: General;  Laterality: N/A;   COLONOSCOPY     adebocarcinoma,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     Nose: Nose normal.     Mouth/Throat:     Mouth: Mucous membranes are moist.     Pharynx: Oropharynx is clear.  Eyes:     Extraocular Movements: Extraocular movements intact.     Conjunctiva/sclera: Conjunctivae normal.     Pupils: Pupils are equal, round, and reactive to light.  Cardiovascular:     Rate and Rhythm: Normal rate and regular rhythm.     Pulses: Normal pulses.     Heart sounds: Normal heart sounds.  Pulmonary:     Effort: Pulmonary effort is normal.     Breath sounds: Normal breath sounds.   Musculoskeletal:        General: Normal range of motion.     Cervical back: Normal range of motion and neck supple.  Neurological:     General: No focal deficit present.     Mental Status: She is alert and oriented to person, place, and time.  Psychiatric:        Mood and Affect: Mood normal.        Behavior: Behavior normal.    ASSESSMENT AND PLAN: 1. Primary hypertension (Primary) - Blood pressure not at goal during today's visit. Patient asymptomatic without chest pressure, chest pain, palpitations, shortness of breath, worst headache of life, and any additional red flag symptoms. - Begin Amlodipine and Valsartan as prescribed. - Counseled on blood pressure goal of less than 130/80, low-sodium, DASH diet, medication compliance, and 150 minutes of moderate intensity exercise per week as tolerated. Counseled on medication adherence and adverse effects. - Follow-up with primary provider in 2 weeks or sooner if needed.     Patient was given the opportunity to ask questions.  Patient verbalized understanding of the plan and was able to repeat key elements of the plan. Patient was given clear instructions to go to Emergency Department or return to medical center if symptoms don't improve, worsen, or new problems develop.The patient verbalized understanding.   Return in about 4 weeks (around 12/03/2023) for Follow-Up or next available chronic conditions.  Rema Fendt, NP

## 2023-11-05 NOTE — Progress Notes (Signed)
Patient states no other concerns to discuss.

## 2023-11-11 ENCOUNTER — Ambulatory Visit: Payer: Medicaid Other | Admitting: Obstetrics and Gynecology

## 2023-11-20 ENCOUNTER — Encounter: Payer: Medicaid Other | Admitting: Family

## 2023-11-20 NOTE — Progress Notes (Signed)
 Erroneous encounter-disregard

## 2023-12-31 ENCOUNTER — Other Ambulatory Visit: Payer: Self-pay | Admitting: Family

## 2023-12-31 DIAGNOSIS — I10 Essential (primary) hypertension: Secondary | ICD-10-CM

## 2024-01-22 ENCOUNTER — Ambulatory Visit: Admitting: Physician Assistant

## 2024-01-22 VITALS — BP 186/118 | HR 98 | Ht 61.0 in | Wt 201.8 lb

## 2024-01-22 DIAGNOSIS — Z3046 Encounter for surveillance of implantable subdermal contraceptive: Secondary | ICD-10-CM

## 2024-01-22 NOTE — Progress Notes (Signed)
    GYNECOLOGY OFFICE PROCEDURE NOTE  Amy Kim is a 38 y.o. [id number] here for Nexplanon  removal.  Last pap smear was on 12/26/22 and was normal.  No other gynecologic concerns.  Nexplanon  Removal Patient identified, informed consent performed, consent signed.   Appropriate time out taken.   Nexplanon  site identified.  Area prepped in usual sterile fashon. One ml of 1% lidocaine  was used to anesthetize the area at the distal end of the implant. A small stab incision was made right beside the implant on the distal portion.  The Nexplanon  rod was grasped using hemostats and removed without difficulty.  There was minimal blood loss. There were no complications.  3 ml of 1% lidocaine  was injected around the incision for post-procedure analgesia.  Steri-strips were applied over the small incision.  A pressure bandage was applied to reduce any bruising.    The patient tolerated the procedure well and was given post procedure instructions.  Patient is planning to  consider other contraceptive options, but is unsure at this time.  [place], PA-C 01/22/24

## 2024-01-22 NOTE — Progress Notes (Signed)
 Nexplanon  removal. Bleeding.

## 2024-02-19 ENCOUNTER — Other Ambulatory Visit: Payer: Self-pay | Admitting: Family

## 2024-02-19 DIAGNOSIS — I10 Essential (primary) hypertension: Secondary | ICD-10-CM

## 2024-03-19 ENCOUNTER — Telehealth: Payer: Self-pay | Admitting: Family

## 2024-03-19 NOTE — Telephone Encounter (Signed)
 Patient was identified as falling into the [place] Measure - Diabetes.   Patient was: Appointment already scheduled for:  03/30/2024.

## 2024-04-01 ENCOUNTER — Ambulatory Visit: Payer: Self-pay | Admitting: Family

## 2024-04-01 ENCOUNTER — Ambulatory Visit (INDEPENDENT_AMBULATORY_CARE_PROVIDER_SITE_OTHER): Admitting: Family

## 2024-04-01 VITALS — BP 165/121 | HR 93 | Temp 99.3°F | Resp 16 | Ht 61.0 in | Wt 200.0 lb

## 2024-04-01 DIAGNOSIS — I10 Essential (primary) hypertension: Secondary | ICD-10-CM

## 2024-04-01 DIAGNOSIS — Z7984 Long term (current) use of oral hypoglycemic drugs: Secondary | ICD-10-CM

## 2024-04-01 DIAGNOSIS — E119 Type 2 diabetes mellitus without complications: Secondary | ICD-10-CM

## 2024-04-01 DIAGNOSIS — Z23 Encounter for immunization: Secondary | ICD-10-CM | POA: Diagnosis not present

## 2024-04-01 LAB — POCT GLYCOSYLATED HEMOGLOBIN (HGB A1C): Hemoglobin A1C: 7.3 % — AB (ref 4.0–5.6)

## 2024-04-01 MED ORDER — VALSARTAN 80 MG PO TABS: 80.0000 mg | ORAL_TABLET | Freq: Every day | ORAL | 0 refills | Status: DC

## 2024-04-01 MED ORDER — AMLODIPINE BESYLATE 10 MG PO TABS: 10.0000 mg | ORAL_TABLET | Freq: Every day | ORAL | 0 refills | Status: DC

## 2024-04-01 MED ORDER — METFORMIN HCL 500 MG PO TABS: 500.0000 mg | ORAL_TABLET | Freq: Two times a day (BID) | ORAL | 0 refills | Status: DC

## 2024-04-01 NOTE — Progress Notes (Signed)
 A1C check

## 2024-04-01 NOTE — Progress Notes (Signed)
 Patient ID: Amy Kim, female    DOB: [birth year]  MRN: [id number]  CC: Chronic Conditions Follow-Up  Subjective: Amy Kim is a 38 y.o. female who presents for chronic conditions follow-up.   Her concerns today include:  - Doing well on Valsartan , no issues/concerns. States she has not taken Amlodipine  in 1 month due to pharmacy related issues with refills. She does not complain of red flag symptoms such as but not limited to chest pain, shortness of breath, worst headache of life, nausea/vomiting.  - Doing well on Metformin  500 mg daily. States she decreased work schedule with goal of improving diet. Denies red flag symptoms associated with diabetes.  - States up to date on diabetic eye exam. - Due for diabetic foot exam.  Patient Active Problem List   Diagnosis Date Noted   History of cesarean delivery 06/03/2023   Dehiscence or disruption of uterine wound in the puerperium 06/03/2023   Advanced maternal age in multigravida, third trimester 05/16/2023   IUGR (intrauterine growth restriction) affecting care of mother 05/10/2023   Supervision of high risk pregnancy, antepartum 11/23/2022   Type 2 diabetes mellitus (HCC) 01/13/2020   Thrombocytosis 01/13/2020   Essential hypertension 12/17/2019   Refusal of blood transfusions as patient is Jehovah's Witness 09/18/2018   history of c section x 5 03/24/2015     Current Outpatient Medications on File Prior to Visit  Medication Sig Dispense Refill   acetaminophen  (TYLENOL ) 500 MG tablet Take 2 tablets (1,000 mg total) by mouth every 6 (six) hours. (Patient taking differently: Take 1,000 mg by mouth every 6 (six) hours. As needed) 30 tablet 0   coconut oil OIL Apply 1 Application topically as needed. (Patient not taking: Reported on 07/29/2023)     furosemide  (LASIX ) 20 MG tablet Take 1 tablet (20 mg total) by mouth daily. (Patient not taking: Reported on 07/29/2023) 7 tablet 0   HYDROcodone -acetaminophen  (NORCO/VICODIN)  5-325 MG tablet Take 1-2 tablets by mouth every 4 (four) hours as needed for moderate pain. (Patient not taking: Reported on 07/29/2023) 30 tablet 0   insulin  glargine (LANTUS ) 100 UNIT/ML Solostar Pen Inject 6 Units into the skin 2 (two) times daily. Give first thing in the morning & right before you go to bed (Patient not taking: Reported on 05/31/2023) 15 mL 0   Insulin  Pen Needle (PEN NEEDLES) 33G X 4 MM MISC Please use as directed (Patient not taking: Reported on 07/29/2023) 100 each 5   iron  polysaccharides (NIFEREX) 150 MG capsule Take 1 capsule (150 mg total) by mouth every other day. 15 capsule 0   Prenatal MV & Min w/FA-DHA (PRENATAL GUMMIES) 0.18-25 MG CHEW Chew 2 each by mouth daily. (Patient not taking: Reported on 07/29/2023)     senna-docusate (SENOKOT-S) 8.6-50 MG tablet Take 2 tablets by mouth daily. (Patient not taking: Reported on 07/29/2023)     witch hazel-glycerin  (TUCKS) pad Apply 1 Application topically as needed for hemorrhoids. (Patient not taking: Reported on 07/29/2023)     No current facility-administered medications on file prior to visit.    Allergies  Allergen Reactions   Aspirin     Hx of bleeding ulcers   Ibuprofen      Hx of bleeding ulcers    Social History   Socioeconomic History   Marital status: Married    Spouse name: Not on file   Number of children: Not on file   Years of education: Not on file   Highest education level: 12th grade  Occupational  History   Not on file  Tobacco Use   Smoking status: Never    Passive exposure: Never   Smokeless tobacco: Never  Vaping Use   Vaping status: Never Used  Substance and Sexual Activity   Alcohol use: Not Currently    Comment: occasionally   Drug use: No   Sexual activity: Yes    Birth control/protection: None    Comment: Medroxyprogesterone    Other Topics Concern   Not on file  Social History Narrative   Not on file   Social Drivers of Health   Financial Resource Strain: Low Risk  (04/01/2024)    Overall Financial Resource Strain (CARDIA)    Difficulty of Paying Living Expenses: Not very hard  Food Insecurity: No Food Insecurity (04/01/2024)   Hunger Vital Sign    Worried About Running Out of Food in the Last Year: Never true    Ran Out of Food in the Last Year: Never true  Transportation Needs: No Transportation Needs (04/01/2024)   PRAPARE - Administrator, Civil Service (Medical): No    Lack of Transportation (Non-Medical): No  Physical Activity: Insufficiently Active (04/01/2024)   Exercise Vital Sign    Days of Exercise per Week: 5 days    Minutes of Exercise per Session: 20 min  Stress: No Stress Concern Present (04/01/2024)   [employer] of Occupational Health - Occupational Stress Questionnaire    Feeling of Stress: Not at all  Social Connections: Moderately Isolated (04/01/2024)   Social Connection and Isolation Panel    Frequency of Communication with Friends and Family: More than three times a week    Frequency of Social Gatherings with Friends and Family: More than three times a week    Attends Religious Services: More than 4 times per year    Active Member of [employer] or Organizations: No    Attends Banker Meetings: Not on file    Marital Status: Separated  Intimate Partner Violence: Not At Risk (06/03/2023)   Humiliation, Afraid, Rape, and Kick questionnaire    Fear of Current or Ex-Partner: No    Emotionally Abused: No    Physically Abused: No    Sexually Abused: No    Family History  Problem Relation Age of Onset   Colon polyps Mother    Hypertension Mother    Hypertension Father    Heart disease Paternal Grandmother    Colon cancer Neg Hx    Esophageal cancer Neg Hx    Pancreatic cancer Neg Hx    Stomach cancer Neg Hx    Liver disease Neg Hx    Crohn's disease Neg Hx    Rectal cancer Neg Hx    Ulcerative colitis Neg Hx     Past Surgical History:  Procedure Laterality Date   CESAREAN SECTION  2011   x 4 total   CESAREAN  SECTION N/A 08/11/2015   Procedure: REPEAT CESAREAN SECTION;  Surgeon: Lang JINNY Peel, DO;  Location: [facility];  Service: Obstetrics;  Laterality: N/A;   CESAREAN SECTION N/A 09/19/2018   Procedure: CESAREAN SECTION;  Surgeon: Herchel Gloris LABOR, MD;  Location: [facility];  Service: Obstetrics;  Laterality: N/A;   CESAREAN SECTION  2014   CESAREAN SECTION N/A 06/03/2023   Procedure: CESAREAN SECTION;  Surgeon: Lola Donnice HERO, MD;  Location: [facility];  Service: Obstetrics;  Laterality: N/A;   CHOLECYSTECTOMY N/A 03/21/2016   Procedure: LAPAROSCOPIC CHOLECYSTECTOMY;  Surgeon: Lynda Leos, MD;  Location: [facility];  Service: General;  Laterality: N/A;   COLONOSCOPY     adebocarcinoma,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     Nose: Nose normal.     Mouth/Throat:     Mouth: Mucous membranes are moist.     Pharynx: Oropharynx is clear.  Eyes:     Extraocular Movements: Extraocular movements intact.     Conjunctiva/sclera: Conjunctivae normal.     Pupils: Pupils are equal, round, and reactive to light.  Cardiovascular:     Rate and Rhythm: Normal rate and regular rhythm.     Pulses: Normal pulses.     Heart sounds: Normal heart sounds.  Pulmonary:     Effort: Pulmonary effort is normal.     Breath sounds: Normal breath sounds.  Musculoskeletal:        General: Normal range of motion.     Cervical back: Normal range of motion and neck supple.  Neurological:     General: No focal deficit present.     Mental Status: She is alert and oriented to person, place, and time.  Psychiatric:        Mood and Affect: Mood normal.        Behavior: Behavior normal.     ASSESSMENT AND  PLAN: 1. Primary hypertension (Primary) - Blood pressure not at goal during today's visit. Patient asymptomatic without chest pressure, chest pain, palpitations, shortness of breath, worst headache of life, and any additional red flag symptoms. - Increase Valsartan  from 40 mg to 80 mg as prescribed.  - Resume Amlodipine  as prescribed.  - Routine screening.  - Counseled on blood pressure goal of less than 130/80, low-sodium, DASH diet, medication compliance, and 150 minutes of moderate intensity exercise per week as tolerated. Counseled on medication adherence and adverse effects. - Follow-up with primary provider in 4 weeks or sooner if needed. - Basic Metabolic Panel - valsartan  (DIOVAN ) 80 MG tablet; Take 1 tablet (80 mg total) by mouth daily.  Dispense: 90 tablet; Refill: 0 - amLODipine  (NORVASC ) 10 MG tablet; Take 1 tablet (10 mg total) by mouth daily.  Dispense: 90 tablet; Refill: 0  2. Type 2 diabetes mellitus without complication, without long-term current use of insulin  (HCC) - Hemoglobin A1c above goal at 7.3%, goal 7%. - Increase Metformin  from 500 mg daily to 500 mg twice daily.  - Routine screening.  - Discussed the importance of healthy eating habits, low-carbohydrate diet, low-sugar diet, regular aerobic exercise (at least 150 minutes a week as tolerated) and medication compliance to achieve or maintain control of diabetes. Counseled on medication adherence/adverse effects.  - Follow-up with primary provider in 4 weeks or sooner if needed. - HgB A1c - Microalbumin / creatinine urine ratio - metFORMIN  (GLUCOPHAGE ) 500 MG tablet; Take 1 tablet (500 mg total) by mouth 2 (two) times daily with a meal.  Dispense: 180 tablet; Refill: 0  3. Diabetic eye exam [facility]) - Patient stats up to date.  4. Encounter for diabetic foot exam (HCC) - Referral to Podiatry for evaluation/management. - Ambulatory referral to Podiatry  5. Immunization due -  Administered. - Heplisav-B  (HepB-CPG)  Vaccine   Patient was given the opportunity to ask questions.  Patient verbalized understanding of the plan and was able to repeat key elements of the plan. Patient was given clear instructions to go to Emergency Department or return to medical center if symptoms don't improve, worsen, or new problems develop.The patient verbalized understanding.   Orders Placed This Encounter  Procedures   Heplisav-B  (HepB-CPG) Vaccine   Microalbumin / creatinine urine ratio   Basic Metabolic Panel   Ambulatory referral to Podiatry   HgB A1c     Requested Prescriptions   Signed Prescriptions Disp Refills   metFORMIN  (GLUCOPHAGE ) 500 MG tablet 180 tablet 0    Sig: Take 1 tablet (500 mg total) by mouth 2 (two) times daily with a meal.   valsartan  (DIOVAN ) 80 MG tablet 90 tablet 0    Sig: Take 1 tablet (80 mg total) by mouth daily.   amLODipine  (NORVASC ) 10 MG tablet 90 tablet 0    Sig: Take 1 tablet (10 mg total) by mouth daily.    Return in about 4 weeks (around 04/29/2024) for Follow-Up or next available chronic conditions.  Greig JINNY Drones, NP

## 2024-04-02 LAB — BASIC METABOLIC PANEL WITH GFR
BUN/Creatinine Ratio: 19 (ref 9–23)
BUN: 14 mg/dL (ref 6–20)
CO2: 19 mmol/L — ABNORMAL LOW (ref 20–29)
Calcium: 9.5 mg/dL (ref 8.7–10.2)
Chloride: 104 mmol/L (ref 96–106)
Creatinine, Ser: 0.74 mg/dL (ref 0.57–1.00)
Glucose: 99 mg/dL (ref 70–99)
Potassium: 4.4 mmol/L (ref 3.5–5.2)
Sodium: 139 mmol/L (ref 134–144)
eGFR: 107 mL/min/1.73 (ref >59)

## 2024-04-02 LAB — MICROALBUMIN / CREATININE URINE RATIO
Creatinine, Urine: 210.2 mg/dL
Microalb/Creat Ratio: 31 mg/g{creat} — ABNORMAL HIGH (ref 0–29)
Microalbumin, Urine: 65.7 ug/mL

## 2024-04-21 ENCOUNTER — Ambulatory Visit: Admitting: Podiatry

## 2024-05-04 ENCOUNTER — Ambulatory Visit (INDEPENDENT_AMBULATORY_CARE_PROVIDER_SITE_OTHER): Admitting: Family

## 2024-05-04 ENCOUNTER — Encounter: Payer: Self-pay | Admitting: Family

## 2024-05-04 VITALS — BP 116/79 | HR 85 | Temp 99.5°F | Resp 16 | Ht 61.0 in | Wt 202.8 lb

## 2024-05-04 DIAGNOSIS — I10 Essential (primary) hypertension: Secondary | ICD-10-CM | POA: Diagnosis not present

## 2024-05-04 NOTE — Progress Notes (Signed)
 Patient ID: Amy Kim, female    DOB: [birth year]  MRN: [id number]  CC: Chronic Conditions Follow-Up  Subjective: Deundra Furber is a 38 y.o. female who presents for chronic conditions follow-up.   Her concerns today include:  Doing well on Valsartan  and Amlodipine , no issues/concerns. She does not complain of red flag symptoms such as but not limited to chest pain, shortness of breath, worst headache of life, nausea/vomiting.   Patient Active Problem List   Diagnosis Date Noted   History of cesarean delivery 06/03/2023   Dehiscence or disruption of uterine wound in the puerperium 06/03/2023   Advanced maternal age in multigravida, third trimester 05/16/2023   IUGR (intrauterine growth restriction) affecting care of mother 05/10/2023   Supervision of high risk pregnancy, antepartum 11/23/2022   Type 2 diabetes mellitus (HCC) 01/13/2020   Thrombocytosis 01/13/2020   Essential hypertension 12/17/2019   Refusal of blood transfusions as patient is Jehovah's Witness 09/18/2018   history of c section x 5 03/24/2015     Current Outpatient Medications on File Prior to Visit  Medication Sig Dispense Refill   acetaminophen  (TYLENOL ) 500 MG tablet Take 2 tablets (1,000 mg total) by mouth every 6 (six) hours. (Patient taking differently: Take 1,000 mg by mouth every 6 (six) hours. As needed) 30 tablet 0   amLODipine  (NORVASC ) 10 MG tablet Take 1 tablet (10 mg total) by mouth daily. 90 tablet 0   metFORMIN  (GLUCOPHAGE ) 500 MG tablet Take 1 tablet (500 mg total) by mouth 2 (two) times daily with a meal. 180 tablet 0   valsartan  (DIOVAN ) 80 MG tablet Take 1 tablet (80 mg total) by mouth daily. 90 tablet 0   coconut oil OIL Apply 1 Application topically as needed. (Patient not taking: Reported on 07/29/2023)     furosemide  (LASIX ) 20 MG tablet Take 1 tablet (20 mg total) by mouth daily. (Patient not taking: Reported on 07/29/2023) 7 tablet 0   HYDROcodone -acetaminophen  (NORCO/VICODIN) 5-325  MG tablet Take 1-2 tablets by mouth every 4 (four) hours as needed for moderate pain. (Patient not taking: Reported on 07/29/2023) 30 tablet 0   insulin  glargine (LANTUS ) 100 UNIT/ML Solostar Pen Inject 6 Units into the skin 2 (two) times daily. Give first thing in the morning & right before you go to bed (Patient not taking: Reported on 05/31/2023) 15 mL 0   Insulin  Pen Needle (PEN NEEDLES) 33G X 4 MM MISC Please use as directed (Patient not taking: Reported on 07/29/2023) 100 each 5   iron  polysaccharides (NIFEREX) 150 MG capsule Take 1 capsule (150 mg total) by mouth every other day. 15 capsule 0   Prenatal MV & Min w/FA-DHA (PRENATAL GUMMIES) 0.18-25 MG CHEW Chew 2 each by mouth daily. (Patient not taking: Reported on 07/29/2023)     senna-docusate (SENOKOT-S) 8.6-50 MG tablet Take 2 tablets by mouth daily. (Patient not taking: Reported on 07/29/2023)     witch hazel-glycerin  (TUCKS) pad Apply 1 Application topically as needed for hemorrhoids. (Patient not taking: Reported on 07/29/2023)     No current facility-administered medications on file prior to visit.    Allergies  Allergen Reactions   Aspirin     Hx of bleeding ulcers   Ibuprofen      Hx of bleeding ulcers    Social History   Socioeconomic History   Marital status: Married    Spouse name: Not on file   Number of children: Not on file   Years of education: Not on file  Highest education level: 12th grade  Occupational History   Not on file  Tobacco Use   Smoking status: Never    Passive exposure: Never   Smokeless tobacco: Never  Vaping Use   Vaping status: Never Used  Substance and Sexual Activity   Alcohol use: Not Currently    Comment: occasionally   Drug use: No   Sexual activity: Yes    Birth control/protection: None    Comment: Medroxyprogesterone    Other Topics Concern   Not on file  Social History Narrative   Not on file   Social Drivers of Health   Financial Resource Strain: Low Risk  (04/01/2024)    Overall Financial Resource Strain (CARDIA)    Difficulty of Paying Living Expenses: Not very hard  Food Insecurity: No Food Insecurity (04/01/2024)   Hunger Vital Sign    Worried About Running Out of Food in the Last Year: Never true    Ran Out of Food in the Last Year: Never true  Transportation Needs: No Transportation Needs (04/01/2024)   PRAPARE - Administrator, Civil Service (Medical): No    Lack of Transportation (Non-Medical): No  Physical Activity: Insufficiently Active (04/01/2024)   Exercise Vital Sign    Days of Exercise per Week: 5 days    Minutes of Exercise per Session: 20 min  Stress: No Stress Concern Present (04/01/2024)   [employer] of Occupational Health - Occupational Stress Questionnaire    Feeling of Stress: Not at all  Social Connections: Moderately Isolated (04/01/2024)   Social Connection and Isolation Panel    Frequency of Communication with Friends and Family: More than three times a week    Frequency of Social Gatherings with Friends and Family: More than three times a week    Attends Religious Services: More than 4 times per year    Active Member of [employer] or Organizations: No    Attends Banker Meetings: Not on file    Marital Status: Separated  Intimate Partner Violence: Not At Risk (06/03/2023)   Humiliation, Afraid, Rape, and Kick questionnaire    Fear of Current or Ex-Partner: No    Emotionally Abused: No    Physically Abused: No    Sexually Abused: No    Family History  Problem Relation Age of Onset   Colon polyps Mother    Hypertension Mother    Hypertension Father    Heart disease Paternal Grandmother    Colon cancer Neg Hx    Esophageal cancer Neg Hx    Pancreatic cancer Neg Hx    Stomach cancer Neg Hx    Liver disease Neg Hx    Crohn's disease Neg Hx    Rectal cancer Neg Hx    Ulcerative colitis Neg Hx     Past Surgical History:  Procedure Laterality Date   CESAREAN SECTION  2011   x 4 total   CESAREAN  SECTION N/A 08/11/2015   Procedure: REPEAT CESAREAN SECTION;  Surgeon: Lang JINNY Peel, DO;  Location: [facility];  Service: Obstetrics;  Laterality: N/A;   CESAREAN SECTION N/A 09/19/2018   Procedure: CESAREAN SECTION;  Surgeon: Herchel Gloris LABOR, MD;  Location: [facility];  Service: Obstetrics;  Laterality: N/A;   CESAREAN SECTION  2014   CESAREAN SECTION N/A 06/03/2023   Procedure: CESAREAN SECTION;  Surgeon: Lola Donnice HERO, MD;  Location: [facility];  Service: Obstetrics;  Laterality: N/A;   CHOLECYSTECTOMY N/A 03/21/2016   Procedure: LAPAROSCOPIC CHOLECYSTECTOMY;  Surgeon: Lynda  Rubin, MD;  Location: [facility];  Service: General;  Laterality: N/A;   COLONOSCOPY     adebocarcinoma,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     Nose: Nose normal.     Mouth/Throat:     Mouth: Mucous membranes are moist.     Pharynx: Oropharynx is clear.  Eyes:     Extraocular Movements: Extraocular movements intact.     Conjunctiva/sclera: Conjunctivae normal.     Pupils: Pupils are equal, round, and reactive to light.  Cardiovascular:     Rate and Rhythm: Normal rate and regular rhythm.     Pulses: Normal pulses.     Heart sounds: Normal heart sounds.  Pulmonary:     Effort: Pulmonary effort is normal.     Breath sounds: Normal breath sounds.  Musculoskeletal:        General: Normal range of motion.     Cervical back: Normal range of motion and neck supple.  Neurological:     General: No focal deficit present.     Mental Status: She is alert and oriented to person, place, and time.  Psychiatric:        Mood and Affect: Mood normal.        Behavior: Behavior normal.    ASSESSMENT AND  PLAN: 1. Primary hypertension (Primary) - Continue Valsartan  and Amlodipine  as prescribed. No refills needed as of present.  - Counseled on blood pressure goal of less than 130/80, low-sodium, DASH diet, medication compliance, and 150 minutes of moderate intensity exercise per week as tolerated. Counseled on medication adherence and adverse effects. - Follow-up with primary provider in 3 months or sooner if needed.   Patient was given the opportunity to ask questions.  Patient verbalized understanding of the plan and was able to repeat key elements of the plan. Patient was given clear instructions to go to Emergency Department or return to medical center if symptoms don't improve, worsen, or new problems develop.The patient verbalized understanding.   Return in about 3 months (around 08/04/2024) for Follow-Up or next available chronic conditions.  Greig JINNY Drones, NP

## 2024-05-18 ENCOUNTER — Encounter: Payer: Self-pay | Admitting: Family

## 2024-05-19 NOTE — Telephone Encounter (Signed)
 Schedule appointment?

## 2024-05-19 NOTE — Telephone Encounter (Signed)
 Pt scheduled

## 2024-05-20 ENCOUNTER — Ambulatory Visit (INDEPENDENT_AMBULATORY_CARE_PROVIDER_SITE_OTHER): Admitting: Family

## 2024-05-20 ENCOUNTER — Encounter: Payer: Self-pay | Admitting: Family

## 2024-05-20 ENCOUNTER — Ambulatory Visit: Payer: Self-pay | Admitting: Family

## 2024-05-20 ENCOUNTER — Other Ambulatory Visit (HOSPITAL_COMMUNITY)
Admission: RE | Admit: 2024-05-20 | Discharge: 2024-05-20 | Disposition: A | Discharge status: Home / Self Care | Source: Ambulatory Visit | Attending: Family | Admitting: Family

## 2024-05-20 VITALS — BP 130/84 | HR 91 | Temp 98.5°F | Resp 16 | Ht 61.0 in | Wt 201.0 lb

## 2024-05-20 DIAGNOSIS — Z113 Encounter for screening for infections with a predominantly sexual mode of transmission: Secondary | ICD-10-CM | POA: Insufficient documentation

## 2024-05-20 DIAGNOSIS — N926 Irregular menstruation, unspecified: Secondary | ICD-10-CM

## 2024-05-20 DIAGNOSIS — N760 Acute vaginitis: Secondary | ICD-10-CM

## 2024-05-20 DIAGNOSIS — Z114 Encounter for screening for human immunodeficiency virus [HIV]: Secondary | ICD-10-CM | POA: Diagnosis not present

## 2024-05-20 LAB — POCT URINALYSIS DIP (CLINITEK)
Bilirubin, UA: NEGATIVE
Blood, UA: NEGATIVE
Glucose, UA: 100 mg/dL — AB
Ketones, POC UA: NEGATIVE mg/dL
Leukocytes, UA: NEGATIVE
Nitrite, UA: NEGATIVE
POC PROTEIN,UA: NEGATIVE
Spec Grav, UA: 1.025 (ref 1.010–1.025)
Urobilinogen, UA: 0.2 U/dL
pH, UA: 6 (ref 5.0–8.0)

## 2024-05-20 NOTE — Progress Notes (Signed)
 Blood work for STD and testing and pregnancy testing

## 2024-05-20 NOTE — Progress Notes (Signed)
 Patient ID: Amy Kim, female    DOB: [birth year]  MRN: [id number]  CC: Follow-Up  Subjective: Amy Kim is a 38 y.o. female who presents for follow-up.  Her concerns today include:  - States blood tinged with mucus after wiping following urination. Denies red flag symptoms. States she had similar symptoms during previous pregnancies in the past. States her last period was 04/17/2024. She has not taken a home pregnancy test. - Routine STD test. Denies symptoms/recent exposure.   Patient Active Problem List   Diagnosis Date Noted   History of cesarean delivery 06/03/2023   Dehiscence or disruption of uterine wound in the puerperium 06/03/2023   Advanced maternal age in multigravida, third trimester 05/16/2023   IUGR (intrauterine growth restriction) affecting care of mother 05/10/2023   Supervision of high risk pregnancy, antepartum 11/23/2022   Type 2 diabetes mellitus (HCC) 01/13/2020   Thrombocytosis 01/13/2020   Essential hypertension 12/17/2019   Refusal of blood transfusions as patient is Jehovah's Witness 09/18/2018   history of c section x 5 03/24/2015     Current Outpatient Medications on File Prior to Visit  Medication Sig Dispense Refill   amLODipine  (NORVASC ) 10 MG tablet Take 1 tablet (10 mg total) by mouth daily. 90 tablet 0   metFORMIN  (GLUCOPHAGE ) 500 MG tablet Take 1 tablet (500 mg total) by mouth 2 (two) times daily with a meal. 180 tablet 0   valsartan  (DIOVAN ) 80 MG tablet Take 1 tablet (80 mg total) by mouth daily. 90 tablet 0   acetaminophen  (TYLENOL ) 500 MG tablet Take 2 tablets (1,000 mg total) by mouth every 6 (six) hours. (Patient taking differently: Take 1,000 mg by mouth every 6 (six) hours. As needed) 30 tablet 0   coconut oil OIL Apply 1 Application topically as needed. (Patient not taking: Reported on 07/29/2023)     furosemide  (LASIX ) 20 MG tablet Take 1 tablet (20 mg total) by mouth daily. (Patient not taking: Reported on 07/29/2023) 7  tablet 0   HYDROcodone -acetaminophen  (NORCO/VICODIN) 5-325 MG tablet Take 1-2 tablets by mouth every 4 (four) hours as needed for moderate pain. (Patient not taking: Reported on 07/29/2023) 30 tablet 0   insulin  glargine (LANTUS ) 100 UNIT/ML Solostar Pen Inject 6 Units into the skin 2 (two) times daily. Give first thing in the morning & right before you go to bed (Patient not taking: Reported on 05/31/2023) 15 mL 0   Insulin  Pen Needle (PEN NEEDLES) 33G X 4 MM MISC Please use as directed (Patient not taking: Reported on 07/29/2023) 100 each 5   iron  polysaccharides (NIFEREX) 150 MG capsule Take 1 capsule (150 mg total) by mouth every other day. 15 capsule 0   Prenatal MV & Min w/FA-DHA (PRENATAL GUMMIES) 0.18-25 MG CHEW Chew 2 each by mouth daily. (Patient not taking: Reported on 07/29/2023)     senna-docusate (SENOKOT-S) 8.6-50 MG tablet Take 2 tablets by mouth daily. (Patient not taking: Reported on 07/29/2023)     witch hazel-glycerin  (TUCKS) pad Apply 1 Application topically as needed for hemorrhoids. (Patient not taking: Reported on 07/29/2023)     No current facility-administered medications on file prior to visit.    Allergies  Allergen Reactions   Aspirin     Hx of bleeding ulcers   Ibuprofen      Hx of bleeding ulcers    Social History   Socioeconomic History   Marital status: Married    Spouse name: Not on file   Number of children: Not on file  Years of education: Not on file   Highest education level: 12th grade  Occupational History   Not on file  Tobacco Use   Smoking status: Never    Passive exposure: Never   Smokeless tobacco: Never  Vaping Use   Vaping status: Never Used  Substance and Sexual Activity   Alcohol use: Not Currently    Comment: occasionally   Drug use: No   Sexual activity: Yes    Birth control/protection: None    Comment: Medroxyprogesterone    Other Topics Concern   Not on file  Social History Narrative   Not on file   Social Drivers of Health    Financial Resource Strain: Low Risk  (04/01/2024)   Overall Financial Resource Strain (CARDIA)    Difficulty of Paying Living Expenses: Not very hard  Food Insecurity: No Food Insecurity (04/01/2024)   Hunger Vital Sign    Worried About Running Out of Food in the Last Year: Never true    Ran Out of Food in the Last Year: Never true  Transportation Needs: No Transportation Needs (04/01/2024)   PRAPARE - Administrator, Civil Service (Medical): No    Lack of Transportation (Non-Medical): No  Physical Activity: Insufficiently Active (04/01/2024)   Exercise Vital Sign    Days of Exercise per Week: 5 days    Minutes of Exercise per Session: 20 min  Stress: No Stress Concern Present (04/01/2024)   [employer] of Occupational Health - Occupational Stress Questionnaire    Feeling of Stress: Not at all  Social Connections: Moderately Isolated (04/01/2024)   Social Connection and Isolation Panel    Frequency of Communication with Friends and Family: More than three times a week    Frequency of Social Gatherings with Friends and Family: More than three times a week    Attends Religious Services: More than 4 times per year    Active Member of [employer] or Organizations: No    Attends Banker Meetings: Not on file    Marital Status: Separated  Intimate Partner Violence: Not At Risk (06/03/2023)   Humiliation, Afraid, Rape, and Kick questionnaire    Fear of Current or Ex-Partner: No    Emotionally Abused: No    Physically Abused: No    Sexually Abused: No    Family History  Problem Relation Age of Onset   Colon polyps Mother    Hypertension Mother    Hypertension Father    Heart disease Paternal Grandmother    Colon cancer Neg Hx    Esophageal cancer Neg Hx    Pancreatic cancer Neg Hx    Stomach cancer Neg Hx    Liver disease Neg Hx    Crohn's disease Neg Hx    Rectal cancer Neg Hx    Ulcerative colitis Neg Hx     Past Surgical History:  Procedure Laterality Date    CESAREAN SECTION  2011   x 4 total   CESAREAN SECTION N/A 08/11/2015   Procedure: REPEAT CESAREAN SECTION;  Surgeon: Lang JINNY Peel, DO;  Location: [facility];  Service: Obstetrics;  Laterality: N/A;   CESAREAN SECTION N/A 09/19/2018   Procedure: CESAREAN SECTION;  Surgeon: Herchel Gloris LABOR, MD;  Location: [facility];  Service: Obstetrics;  Laterality: N/A;   CESAREAN SECTION  2014   CESAREAN SECTION N/A 06/03/2023   Procedure: CESAREAN SECTION;  Surgeon: Lola Donnice HERO, MD;  Location: [facility];  Service: Obstetrics;  Laterality: N/A;   CHOLECYSTECTOMY N/A 03/21/2016  Procedure: LAPAROSCOPIC CHOLECYSTECTOMY;  Surgeon: Lynda Leos, MD;  Location: [facility];  Service: General;  Laterality: N/A;   COLONOSCOPY     adebocarcinoma,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     Nose: Nose normal.     Mouth/Throat:     Mouth: Mucous membranes are moist.     Pharynx: Oropharynx is clear.  Eyes:     Extraocular Movements: Extraocular movements intact.     Conjunctiva/sclera: Conjunctivae normal.     Pupils: Pupils are equal, round, and reactive to light.  Cardiovascular:     Rate and Rhythm: Normal rate and regular rhythm.     Pulses: Normal pulses.     Heart sounds: Normal heart sounds.  Pulmonary:     Effort: Pulmonary effort is normal.     Breath sounds: Normal breath sounds.  Musculoskeletal:        General: Normal range of motion.     Cervical back: Normal range of motion and neck supple.  Neurological:     General: No focal deficit present.     Mental Status: She is alert and oriented to person, place, and time.  Psychiatric:        Mood and Affect: Mood normal.         Behavior: Behavior normal.    ASSESSMENT AND PLAN: 1. Late menses (Primary) - Routine screening.  - hCG, serum, qualitative - POCT urine pregnancy; Future  2. Routine screening for STI (sexually transmitted infection) - Routine screening.  - Cervicovaginal ancillary only - POCT URINALYSIS DIP (CLINITEK); Future - Urine Culture  3. Encounter for screening for HIV - Routine screening.  - HIV antibody (with reflex)   Patient was given the opportunity to ask questions.  Patient verbalized understanding of the plan and was able to repeat key elements of the plan. Patient was given clear instructions to go to Emergency Department or return to medical center if symptoms don't improve, worsen, or new problems develop.The patient verbalized understanding.   Orders Placed This Encounter  Procedures   Urine Culture   HIV antibody (with reflex)   hCG, serum, qualitative   POCT urine pregnancy   POCT URINALYSIS DIP (CLINITEK)   Return for Follow-up as needed.  Greig JINNY Drones, NP

## 2024-05-21 LAB — HIV ANTIBODY (ROUTINE TESTING W REFLEX): HIV Screen 4th Generation wRfx: NONREACTIVE

## 2024-05-21 LAB — HCG, SERUM, QUALITATIVE: hCG,Beta Subunit,Qual,Serum: NEGATIVE m[IU]/mL (ref <6)

## 2024-05-22 LAB — CERVICOVAGINAL ANCILLARY ONLY
Bacterial Vaginitis (gardnerella): POSITIVE — AB
Candida Glabrata: NEGATIVE
Candida Vaginitis: NEGATIVE
Chlamydia: NEGATIVE
Comment: NEGATIVE
Comment: NEGATIVE
Comment: NEGATIVE
Comment: NEGATIVE
Comment: NEGATIVE
Comment: NORMAL
Neisseria Gonorrhea: NEGATIVE
Trichomonas: NEGATIVE

## 2024-05-22 LAB — URINE CULTURE

## 2024-05-26 MED ORDER — METRONIDAZOLE 500 MG PO TABS: 500.0000 mg | ORAL_TABLET | Freq: Two times a day (BID) | ORAL | 0 refills | Status: AC

## 2024-06-02 ENCOUNTER — Ambulatory Visit: Admitting: Obstetrics and Gynecology

## 2024-06-02 NOTE — Progress Notes (Unsigned)
 Patient not seen by a provider. Appointment cancelled by the patient

## 2024-06-18 NOTE — Telephone Encounter (Signed)
 Pt scheduled

## 2024-06-19 ENCOUNTER — Other Ambulatory Visit

## 2024-06-23 NOTE — Telephone Encounter (Signed)
Pt scheduled tomorrow afternoon

## 2024-06-24 ENCOUNTER — Ambulatory Visit

## 2024-07-06 ENCOUNTER — Ambulatory Visit (INDEPENDENT_AMBULATORY_CARE_PROVIDER_SITE_OTHER)

## 2024-07-06 DIAGNOSIS — Z111 Encounter for screening for respiratory tuberculosis: Secondary | ICD-10-CM

## 2024-07-06 NOTE — Progress Notes (Unsigned)
 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 Pt tolerated injection will. Scheduled to come back Wednesday to have read.

## 2024-07-08 ENCOUNTER — Ambulatory Visit

## 2024-08-04 ENCOUNTER — Encounter: Admitting: Family

## 2024-08-04 NOTE — Progress Notes (Signed)
 Erroneous encounter-disregard

## 2024-08-24 ENCOUNTER — Other Ambulatory Visit: Payer: Self-pay | Admitting: Family

## 2024-08-24 DIAGNOSIS — I10 Essential (primary) hypertension: Secondary | ICD-10-CM

## 2024-09-10 ENCOUNTER — Other Ambulatory Visit (HOSPITAL_COMMUNITY)
Admission: RE | Admit: 2024-09-10 | Discharge: 2024-09-10 | Disposition: A | Source: Ambulatory Visit | Attending: Family | Admitting: Family

## 2024-09-10 ENCOUNTER — Ambulatory Visit (INDEPENDENT_AMBULATORY_CARE_PROVIDER_SITE_OTHER): Admitting: Family

## 2024-09-10 VITALS — BP 120/83 | HR 73 | Temp 98.4°F | Resp 16 | Ht 61.0 in | Wt 195.6 lb

## 2024-09-10 DIAGNOSIS — E1165 Type 2 diabetes mellitus with hyperglycemia: Secondary | ICD-10-CM | POA: Diagnosis not present

## 2024-09-10 DIAGNOSIS — Z113 Encounter for screening for infections with a predominantly sexual mode of transmission: Secondary | ICD-10-CM

## 2024-09-10 DIAGNOSIS — I10 Essential (primary) hypertension: Secondary | ICD-10-CM | POA: Diagnosis not present

## 2024-09-10 DIAGNOSIS — E119 Type 2 diabetes mellitus without complications: Secondary | ICD-10-CM

## 2024-09-10 DIAGNOSIS — Z7984 Long term (current) use of oral hypoglycemic drugs: Secondary | ICD-10-CM

## 2024-09-10 DIAGNOSIS — Z114 Encounter for screening for human immunodeficiency virus [HIV]: Secondary | ICD-10-CM | POA: Diagnosis not present

## 2024-09-10 MED ORDER — METFORMIN HCL 500 MG PO TABS: 500.0000 mg | ORAL_TABLET | Freq: Two times a day (BID) | ORAL | 0 refills | Status: DC

## 2024-09-10 MED ORDER — AMLODIPINE BESYLATE 10 MG PO TABS: 10.0000 mg | ORAL_TABLET | Freq: Every day | ORAL | 0 refills | Status: DC

## 2024-09-10 MED ORDER — VALSARTAN 80 MG PO TABS: 80.0000 mg | ORAL_TABLET | Freq: Every day | ORAL | 0 refills | Status: DC

## 2024-09-10 NOTE — Progress Notes (Signed)
 Patient ID: Amy Kim, female    DOB: 07-09-1986  MRN: [id number]  CC: Chronic Conditions Follow-Up  Subjective: Amy Kim is a 38 y.o. female who presents for chronic conditions follow-up.   Her concerns today include:  - Doing well on Valsartan  and Amlodipine , no issues/concerns. She does not complain of red flag symptoms such as but not limited to chest pain, shortness of breath, worst headache of life, nausea/vomiting.  - Doing well on Metformin , no issues/concerns. Denies red flag symptoms associated with diabetes.  - Due for diabetic eye exam.  - Due for diabetic foot exam. - Routine STD/HIV screening. Denies symptoms/recent exposure. States she has a new intercourse partner.   Patient Active Problem List   Diagnosis Date Noted   History of cesarean delivery 06/03/2023   Dehiscence or disruption of uterine wound in the puerperium 06/03/2023   Advanced maternal age in multigravida, third trimester 05/16/2023   IUGR (intrauterine growth restriction) affecting care of mother 05/10/2023   Supervision of high risk pregnancy, antepartum 11/23/2022   Type 2 diabetes mellitus (HCC) 01/13/2020   Thrombocytosis 01/13/2020   Essential hypertension 12/17/2019   Refusal of blood transfusions as patient is Jehovah's Witness 09/18/2018   history of c section x 5 03/24/2015     Medications Ordered Prior to Encounter[1]  Allergies[2]  Social History   Socioeconomic History   Marital status: Married    Spouse name: Not on file   Number of children: Not on file   Years of education: Not on file   Highest education level: 12th grade  Occupational History   Not on file  Tobacco Use   Smoking status: Never    Passive exposure: Never   Smokeless tobacco: Never  Vaping Use   Vaping status: Never Used  Substance and Sexual Activity   Alcohol use: Not Currently    Comment: occasionally   Drug use: No   Sexual activity: Yes    Birth control/protection: None     Comment: Medroxyprogesterone    Other Topics Concern   Not on file  Social History Narrative   Not on file   Social Drivers of Health   Tobacco Use: Low Risk (05/20/2024)   Patient History    Smoking Tobacco Use: Never    Smokeless Tobacco Use: Never    Passive Exposure: Never  Financial Resource Strain: Low Risk (09/10/2024)   Overall Financial Resource Strain (CARDIA)    Difficulty of Paying Living Expenses: Not hard at all  Food Insecurity: No Food Insecurity (09/10/2024)   Epic    Worried About Radiation Protection Practitioner of Food in the Last Year: Never true    Ran Out of Food in the Last Year: Never true  Transportation Needs: No Transportation Needs (09/10/2024)   Epic    Lack of Transportation (Medical): No    Lack of Transportation (Non-Medical): No  Physical Activity: Insufficiently Active (09/10/2024)   Exercise Vital Sign    Days of Exercise per Week: 2 days    Minutes of Exercise per Session: 10 min  Stress: No Stress Concern Present (09/10/2024)   [employer] of Occupational Health - Occupational Stress Questionnaire    Feeling of Stress: Not at all  Social Connections: Moderately Isolated (09/10/2024)   Social Connection and Isolation Panel    Frequency of Communication with Friends and Family: More than three times a week    Frequency of Social Gatherings with Friends and Family: More than three times a week    Attends Religious Services:  1 to 4 times per year    Active Member of Clubs or Organizations: No    Attends Banker Meetings: Not on file    Marital Status: Separated  Intimate Partner Violence: Not At Risk (06/03/2023)   Humiliation, Afraid, Rape, and Kick questionnaire    Fear of Current or Ex-Partner: No    Emotionally Abused: No    Physically Abused: No    Sexually Abused: No  Depression (PHQ2-9): Low Risk (09/10/2024)   Depression (PHQ2-9)    PHQ-2 Score: 0  Alcohol Screen: Low Risk (09/10/2024)   Alcohol Screen    Last Alcohol Screening  Score (AUDIT): 2  Housing: Low Risk (09/10/2024)   Epic    Unable to Pay for Housing in the Last Year: No    Number of Times Moved in the Last Year: 0    Homeless in the Last Year: No  Utilities: Not At Risk (06/03/2023)   [facility] Utilities    Threatened with loss of utilities: No  Health Literacy: Adequate Health Literacy (04/01/2024)   B1300 Health Literacy    Frequency of need for help with medical instructions: Never    Family History  Problem Relation Age of Onset   Colon polyps Mother    Hypertension Mother    Hypertension Father    Heart disease Paternal Grandmother    Colon cancer Neg Hx    Esophageal cancer Neg Hx    Pancreatic cancer Neg Hx    Stomach cancer Neg Hx    Liver disease Neg Hx    Crohn's disease Neg Hx    Rectal cancer Neg Hx    Ulcerative colitis Neg Hx     Past Surgical History:  Procedure Laterality Date   CESAREAN SECTION  2011   x 4 total   CESAREAN SECTION N/A 08/11/2015   Procedure: REPEAT CESAREAN SECTION;  Surgeon: Lang JINNY Peel, DO;  Location: [facility];  Service: Obstetrics;  Laterality: N/A;   CESAREAN SECTION N/A 09/19/2018   Procedure: CESAREAN SECTION;  Surgeon: Herchel Gloris LABOR, MD;  Location: [facility];  Service: Obstetrics;  Laterality: N/A;   CESAREAN SECTION  2014   CESAREAN SECTION N/A 06/03/2023   Procedure: CESAREAN SECTION;  Surgeon: Lola Donnice HERO, MD;  Location: [facility];  Service: Obstetrics;  Laterality: N/A;   CHOLECYSTECTOMY N/A 03/21/2016   Procedure: LAPAROSCOPIC CHOLECYSTECTOMY;  Surgeon: Lynda Leos, MD;  Location: [facility];  Service: General;  Laterality: N/A;   COLONOSCOPY     adebocarcinoma,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     Nose: Nose normal.     Mouth/Throat:     Mouth: Mucous membranes are moist.     Pharynx: Oropharynx is clear.  Eyes:     Extraocular Movements: Extraocular movements intact.     Conjunctiva/sclera: Conjunctivae normal.     Pupils: Pupils are equal, round, and reactive to light.  Cardiovascular:     Rate and Rhythm: Normal rate and regular rhythm.     Pulses: Normal pulses.     Heart sounds:  Normal heart sounds.  Pulmonary:     Effort: Pulmonary effort is normal.     Breath sounds: Normal breath sounds.  Musculoskeletal:        General: Normal range of motion.     Cervical back: Normal range of motion and neck supple.  Neurological:     General: No focal deficit present.     Mental Status: She is alert and oriented to person, place, and time.  Psychiatric:        Mood and Affect: Mood normal.        Behavior: Behavior normal.     ASSESSMENT AND PLAN: 1. Primary hypertension (Primary) - Continue Amlodipine  and Valsartan  as prescribed.  - Routine screening.  - Counseled on blood pressure goal of less than 130/80, low-sodium, DASH diet, medication compliance, and 150 minutes of moderate intensity exercise per week as tolerated. Counseled on medication adherence and adverse effects. - Follow-up with primary provider in 3 months or sooner if needed.  - amLODipine  (NORVASC ) 10 MG tablet; Take 1 tablet (10 mg total) by mouth daily.  Dispense: 90 tablet; Refill: 0 - valsartan  (DIOVAN ) 80 MG tablet; Take 1 tablet (80 mg total) by mouth daily.  Dispense: 90 tablet; Refill: 0 - Basic Metabolic Panel  2. Type 2 diabetes mellitus with hyperglycemia, without long-term current use of insulin  (HCC) - Hemoglobin A1c result pending.  - Continue Metformin  as prescribed.  - Discussed the importance of healthy eating habits, low-carbohydrate diet, low-sugar diet, regular aerobic exercise (at least 150 minutes a week as tolerated) and medication compliance to  achieve or maintain control of diabetes. Counseled on medication adherence/adverse effects.  - Follow-up with primary provider as scheduled.  - Hemoglobin A1c - metFORMIN  (GLUCOPHAGE ) 500 MG tablet; Take 1 tablet (500 mg total) by mouth 2 (two) times daily with a meal.  Dispense: 180 tablet; Refill: 0  3. Diabetic eye exam [facility]) - Referral to Ophthalmology for evaluation/management. - Ambulatory referral to Ophthalmology  4. Encounter for diabetic foot exam [facility] - Referral to Podiatry for evaluation/management. - Ambulatory referral to Podiatry  5. Routine screening for STI (sexually transmitted infection) - Routine screening.  - Cervicovaginal ancillary only  6. Encounter for screening for HIV - Routine screening.  - HIV antibody (with reflex)   Patient was given the opportunity to ask questions.  Patient verbalized understanding of the plan and was able to repeat key elements of the plan. Patient was given clear instructions to go to Emergency Department or return to medical center if symptoms don't improve, worsen, or new problems develop.The patient verbalized understanding.   Orders Placed This Encounter  Procedures   HIV antibody (with reflex)   Basic Metabolic Panel   Hemoglobin A1c   Ambulatory referral to Ophthalmology   Ambulatory referral to Podiatry     Requested Prescriptions   Signed Prescriptions Disp Refills   amLODipine  (NORVASC ) 10 MG tablet 90 tablet 0    Sig: Take 1 tablet (10 mg total) by mouth daily.   valsartan  (DIOVAN ) 80 MG tablet 90 tablet 0    Sig: Take 1 tablet (80 mg total) by mouth daily.   metFORMIN  (GLUCOPHAGE ) 500 MG tablet 180 tablet 0    Sig: Take 1 tablet (500 mg total) by mouth 2 (two) times daily with a meal.    Return in about 3 months (around 12/09/2024) for Follow-Up or next available chronic conditions.  Sequan Auxier JINNY Chute, NP      [1]  Current Outpatient Medications on File  Prior to Visit  Medication Sig Dispense Refill    acetaminophen  (TYLENOL ) 500 MG tablet Take 2 tablets (1,000 mg total) by mouth every 6 (six) hours. (Patient taking differently: Take 1,000 mg by mouth every 6 (six) hours. As needed) 30 tablet 0   No current facility-administered medications on file prior to visit.  [2]  Allergies Allergen Reactions   Aspirin     Hx of bleeding ulcers   Ibuprofen      Hx of bleeding ulcers

## 2024-09-10 NOTE — Progress Notes (Signed)
 3 month follow-up

## 2024-09-11 ENCOUNTER — Telehealth: Payer: Self-pay

## 2024-09-11 ENCOUNTER — Other Ambulatory Visit: Payer: Self-pay | Admitting: Family

## 2024-09-11 ENCOUNTER — Ambulatory Visit: Payer: Self-pay | Admitting: Family

## 2024-09-11 DIAGNOSIS — B9689 Other specified bacterial agents as the cause of diseases classified elsewhere: Secondary | ICD-10-CM

## 2024-09-11 DIAGNOSIS — E119 Type 2 diabetes mellitus without complications: Secondary | ICD-10-CM

## 2024-09-11 LAB — HIV ANTIBODY (ROUTINE TESTING W REFLEX): HIV Screen 4th Generation wRfx: NONREACTIVE

## 2024-09-11 LAB — BASIC METABOLIC PANEL WITH GFR
BUN/Creatinine Ratio: 14 (ref 9–23)
BUN: 10 mg/dL (ref 6–20)
CO2: 24 mmol/L (ref 20–29)
Calcium: 10.2 mg/dL (ref 8.7–10.2)
Chloride: 99 mmol/L (ref 96–106)
Creatinine, Ser: 0.74 mg/dL (ref 0.57–1.00)
Glucose: 105 mg/dL — ABNORMAL HIGH (ref 70–99)
Potassium: 4 mmol/L (ref 3.5–5.2)
Sodium: 137 mmol/L (ref 134–144)
eGFR: 107 mL/min/1.73

## 2024-09-11 LAB — CERVICOVAGINAL ANCILLARY ONLY
Bacterial Vaginitis (gardnerella): POSITIVE — AB
Candida Glabrata: NEGATIVE
Candida Vaginitis: NEGATIVE
Chlamydia: NEGATIVE
Comment: NEGATIVE
Comment: NEGATIVE
Comment: NEGATIVE
Comment: NEGATIVE
Comment: NEGATIVE
Comment: NORMAL
Neisseria Gonorrhea: NEGATIVE
Trichomonas: NEGATIVE

## 2024-09-11 LAB — HEMOGLOBIN A1C
Est. average glucose Bld gHb Est-mCnc: 143 mg/dL
Hgb A1c MFr Bld: 6.6 % — ABNORMAL HIGH (ref 4.8–5.6)

## 2024-09-11 MED ORDER — METRONIDAZOLE 500 MG PO TABS: 500.0000 mg | ORAL_TABLET | Freq: Two times a day (BID) | ORAL | 0 refills | Status: AC

## 2024-09-11 NOTE — Telephone Encounter (Signed)
 Copied from CRM #[id number]. Topic: Referral - Status >> Sep 11, 2024 11:32 AM Pinkey ORN wrote: Reason for CRM: Referral >> Sep 11, 2024 11:34 AM Pinkey ORN wrote: [place] - Dr. Lamarr Riles Office  Called in regards to patient's referral. States they're having to deny patient, due to her insurance being out of network with them.

## 2024-09-11 NOTE — Telephone Encounter (Signed)
New order complete.

## 2024-09-15 NOTE — Telephone Encounter (Signed)
 Pt scheduled

## 2024-09-29 ENCOUNTER — Ambulatory Visit (INDEPENDENT_AMBULATORY_CARE_PROVIDER_SITE_OTHER): Payer: Self-pay | Admitting: Family

## 2024-09-29 ENCOUNTER — Encounter: Payer: Self-pay | Admitting: Family

## 2024-09-29 VITALS — BP 135/93 | HR 88 | Temp 98.2°F | Resp 16 | Ht 61.0 in | Wt 193.6 lb

## 2024-09-29 DIAGNOSIS — E1165 Type 2 diabetes mellitus with hyperglycemia: Secondary | ICD-10-CM | POA: Diagnosis not present

## 2024-09-29 DIAGNOSIS — Z0189 Encounter for other specified special examinations: Secondary | ICD-10-CM

## 2024-09-29 DIAGNOSIS — Z7984 Long term (current) use of oral hypoglycemic drugs: Secondary | ICD-10-CM

## 2024-09-29 DIAGNOSIS — E119 Type 2 diabetes mellitus without complications: Secondary | ICD-10-CM

## 2024-09-29 DIAGNOSIS — I10 Essential (primary) hypertension: Secondary | ICD-10-CM

## 2024-09-29 MED ORDER — AMLODIPINE BESYLATE 10 MG PO TABS: 10.0000 mg | ORAL_TABLET | Freq: Every day | ORAL | 0 refills | Status: AC

## 2024-09-29 MED ORDER — VALSARTAN 80 MG PO TABS: 80.0000 mg | ORAL_TABLET | Freq: Every day | ORAL | 0 refills | Status: AC

## 2024-09-29 MED ORDER — METFORMIN HCL 500 MG PO TABS: 500.0000 mg | ORAL_TABLET | Freq: Two times a day (BID) | ORAL | 0 refills | Status: AC

## 2024-09-29 NOTE — Progress Notes (Signed)
 "   Patient ID: Amy Kim, female    DOB: 1986-09-02  MRN: [id number]  CC: Chronic Conditions Follow-Up  Subjective: Amy Kim is a 39 y.o. female who presents for chronic conditions follow-up.  Her concerns today include:  - Doing well on Amlodipine  and Valsartan , no issues/concerns. She does not complain of red flag symptoms such as but not limited to chest pain, shortness of breath, worst headache of life, nausea/vomiting.  - Doing well on Metformin , no issues/concerns. Denies red flag symptoms associated with diabetes.  - Due for diabetic eye exam.  - Due for diabetic foot exam.   Patient Active Problem List   Diagnosis Date Noted   History of cesarean delivery 06/03/2023   Dehiscence or disruption of uterine wound in the puerperium 06/03/2023   Advanced maternal age in multigravida, third trimester 05/16/2023   IUGR (intrauterine growth restriction) affecting care of mother 05/10/2023   Supervision of high risk pregnancy, antepartum 11/23/2022   Type 2 diabetes mellitus (HCC) 01/13/2020   Thrombocytosis 01/13/2020   Essential hypertension 12/17/2019   Refusal of blood transfusions as patient is Jehovah's Witness 09/18/2018   history of c section x 5 03/24/2015     Medications Ordered Prior to Encounter[1]  Allergies[2]  Social History   Socioeconomic History   Marital status: Married    Spouse name: Not on file   Number of children: Not on file   Years of education: Not on file   Highest education level: 12th grade  Occupational History   Not on file  Tobacco Use   Smoking status: Never    Passive exposure: Never   Smokeless tobacco: Never  Vaping Use   Vaping status: Never Used  Substance and Sexual Activity   Alcohol use: Not Currently    Comment: occasionally   Drug use: No   Sexual activity: Yes    Birth control/protection: None    Comment: Medroxyprogesterone    Other Topics Concern   Not on file  Social History Narrative   Not on file    Social Drivers of Health   Tobacco Use: Low Risk (09/29/2024)   Patient History    Smoking Tobacco Use: Never    Smokeless Tobacco Use: Never    Passive Exposure: Never  Financial Resource Strain: Low Risk (09/10/2024)   Overall Financial Resource Strain (CARDIA)    Difficulty of Paying Living Expenses: Not hard at all  Food Insecurity: No Food Insecurity (09/10/2024)   Epic    Worried About Radiation Protection Practitioner of Food in the Last Year: Never true    Ran Out of Food in the Last Year: Never true  Transportation Needs: No Transportation Needs (09/10/2024)   Epic    Lack of Transportation (Medical): No    Lack of Transportation (Non-Medical): No  Physical Activity: Insufficiently Active (09/10/2024)   Exercise Vital Sign    Days of Exercise per Week: 2 days    Minutes of Exercise per Session: 10 min  Stress: No Stress Concern Present (09/10/2024)   [employer] of Occupational Health - Occupational Stress Questionnaire    Feeling of Stress: Not at all  Social Connections: Moderately Isolated (09/10/2024)   Social Connection and Isolation Panel    Frequency of Communication with Friends and Family: More than three times a week    Frequency of Social Gatherings with Friends and Family: More than three times a week    Attends Religious Services: 1 to 4 times per year    Active Member of Clubs or  Organizations: No    Attends Banker Meetings: Not on file    Marital Status: Separated  Intimate Partner Violence: Not At Risk (06/03/2023)   Humiliation, Afraid, Rape, and Kick questionnaire    Fear of Current or Ex-Partner: No    Emotionally Abused: No    Physically Abused: No    Sexually Abused: No  Depression (PHQ2-9): Low Risk (09/29/2024)   Depression (PHQ2-9)    PHQ-2 Score: 0  Alcohol Screen: Low Risk (09/10/2024)   Alcohol Screen    Last Alcohol Screening Score (AUDIT): 2  Housing: Low Risk (09/10/2024)   Epic    Unable to Pay for Housing in the Last Year: No     Number of Times Moved in the Last Year: 0    Homeless in the Last Year: No  Utilities: Not At Risk (06/03/2023)   [facility] Utilities    Threatened with loss of utilities: No  Health Literacy: Adequate Health Literacy (04/01/2024)   B1300 Health Literacy    Frequency of need for help with medical instructions: Never    Family History  Problem Relation Age of Onset   Colon polyps Mother    Hypertension Mother    Hypertension Father    Heart disease Paternal Grandmother    Colon cancer Neg Hx    Esophageal cancer Neg Hx    Pancreatic cancer Neg Hx    Stomach cancer Neg Hx    Liver disease Neg Hx    Crohn's disease Neg Hx    Rectal cancer Neg Hx    Ulcerative colitis Neg Hx     Past Surgical History:  Procedure Laterality Date   CESAREAN SECTION  2011   x 4 total   CESAREAN SECTION N/A 08/11/2015   Procedure: REPEAT CESAREAN SECTION;  Surgeon: Lang JINNY Peel, DO;  Location: [facility];  Service: Obstetrics;  Laterality: N/A;   CESAREAN SECTION N/A 09/19/2018   Procedure: CESAREAN SECTION;  Surgeon: Herchel Gloris LABOR, MD;  Location: [facility];  Service: Obstetrics;  Laterality: N/A;   CESAREAN SECTION  2014   CESAREAN SECTION N/A 06/03/2023   Procedure: CESAREAN SECTION;  Surgeon: Lola Donnice HERO, MD;  Location: [facility];  Service: Obstetrics;  Laterality: N/A;   CHOLECYSTECTOMY N/A 03/21/2016   Procedure: LAPAROSCOPIC CHOLECYSTECTOMY;  Surgeon: Lynda Leos, MD;  Location: [facility];  Service: General;  Laterality: N/A;   COLONOSCOPY     adebocarcinoma,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     Nose: Nose normal.     Mouth/Throat:     Mouth: Mucous membranes are  moist.     Pharynx: Oropharynx is clear.  Eyes:     Extraocular Movements: Extraocular movements intact.     Conjunctiva/sclera: Conjunctivae normal.     Pupils: Pupils are equal, round, and reactive to light.  Cardiovascular:     Rate and Rhythm: Normal rate and regular rhythm.     Pulses: Normal pulses.     Heart sounds: Normal heart sounds.  Pulmonary:     Effort: Pulmonary effort is  normal.     Breath sounds: Normal breath sounds.  Musculoskeletal:        General: Normal range of motion.     Cervical back: Normal range of motion and neck supple.  Neurological:     General: No focal deficit present.     Mental Status: She is alert and oriented to person, place, and time.  Psychiatric:        Mood and Affect: Mood normal.        Behavior: Behavior normal.    ASSESSMENT AND PLAN: 1. Primary hypertension (Primary) - Blood pressure relative to goal. Patient asymptomatic. - Continue Amlodipine  and Valsartan  as prescribed.  - Counseled on blood pressure goal of less than 130/80, low-sodium, DASH diet, medication compliance, and 150 minutes of moderate intensity exercise per week as tolerated. Counseled on medication adherence and adverse effects. - Follow-up with primary provider in 4 weeks or sooner if needed.  - amLODipine  (NORVASC ) 10 MG tablet; Take 1 tablet (10 mg total) by mouth daily.  Dispense: 90 tablet; Refill: 0 - valsartan  (DIOVAN ) 80 MG tablet; Take 1 tablet (80 mg total) by mouth daily.  Dispense: 90 tablet; Refill: 0  2. Type 2 diabetes mellitus with hyperglycemia, without long-term current use of insulin  (HCC) - Hemoglobin A1c at goal 6.6% on 09/10/2024. Goal 7%.  - Continue Metformin  as prescribed.  - Discussed the importance of healthy eating habits, low-carbohydrate diet, low-sugar diet, regular aerobic exercise (at least 150 minutes a week as tolerated) and medication compliance to achieve or maintain control of diabetes. Counseled on medication adherence/adverse  effects.  - Follow-up with primary provider in March 2026 or sooner if needed.  - metFORMIN  (GLUCOPHAGE ) 500 MG tablet; Take 1 tablet (500 mg total) by mouth 2 (two) times daily with a meal.  Dispense: 180 tablet; Refill: 0  3. Diabetic eye exam [facility]) - Referral to Ophthalmology for evaluation/management. - Ambulatory referral to Ophthalmology  4. Encounter for diabetic foot exam [facility]) - Referral to Podiatry for evaluation/management. - Ambulatory referral to Podiatry   Patient was given the opportunity to ask questions.  Patient verbalized understanding of the plan and was able to repeat key elements of the plan. Patient was given clear instructions to go to Emergency Department or return to medical center if symptoms don't improve, worsen, or new problems develop.The patient verbalized understanding.   Orders Placed This Encounter  Procedures   Ambulatory referral to Podiatry   Ambulatory referral to Ophthalmology     Requested Prescriptions   Signed Prescriptions Disp Refills   amLODipine  (NORVASC ) 10 MG tablet 90 tablet 0    Sig: Take 1 tablet (10 mg total) by mouth daily.   metFORMIN  (GLUCOPHAGE ) 500 MG tablet 180 tablet 0    Sig: Take 1 tablet (500 mg total) by mouth 2 (two) times daily with a meal.   valsartan  (DIOVAN ) 80 MG tablet 90 tablet 0    Sig: Take 1 tablet (80 mg total) by mouth daily.    Return in 4 weeks (on 10/27/2024) for Follow-Up or next available chronic conditions.  Greig JINNY Chute, NP      [1]  Current Outpatient Medications on File Prior to Visit  Medication Sig Dispense Refill   acetaminophen  (TYLENOL ) 500 MG tablet Take 2 tablets (1,000 mg total) by mouth every 6 (six) hours. (Patient taking differently: Take 1,000 mg by mouth every 6 (six) hours. As needed) 30 tablet 0   No current facility-administered medications on file prior to visit.  [  2]  Allergies Allergen Reactions   Aspirin     Hx of bleeding ulcers   Ibuprofen      Hx of bleeding  ulcers   "

## 2024-10-14 ENCOUNTER — Ambulatory Visit: Payer: Self-pay | Admitting: Family

## 2024-12-09 ENCOUNTER — Ambulatory Visit: Payer: Self-pay | Admitting: Family
